# Patient Record
Sex: Female | Born: 1940 | Race: White | Hispanic: No | Marital: Married | Smoking: Never smoker
Health system: Southern US, Community
[De-identification: ages and names within clinical notes are randomized; demographics above are authoritative.]

## PROBLEM LIST (undated history)

## (undated) DIAGNOSIS — I6529 Occlusion and stenosis of unspecified carotid artery: Secondary | ICD-10-CM

## (undated) DIAGNOSIS — E119 Type 2 diabetes mellitus without complications: Secondary | ICD-10-CM

## (undated) DIAGNOSIS — R413 Other amnesia: Secondary | ICD-10-CM

## (undated) DIAGNOSIS — I499 Cardiac arrhythmia, unspecified: Secondary | ICD-10-CM

## (undated) DIAGNOSIS — F32A Depression, unspecified: Secondary | ICD-10-CM

## (undated) DIAGNOSIS — F329 Major depressive disorder, single episode, unspecified: Secondary | ICD-10-CM

## (undated) DIAGNOSIS — C801 Malignant (primary) neoplasm, unspecified: Secondary | ICD-10-CM

## (undated) DIAGNOSIS — I10 Essential (primary) hypertension: Secondary | ICD-10-CM

## (undated) DIAGNOSIS — I639 Cerebral infarction, unspecified: Secondary | ICD-10-CM

## (undated) HISTORY — PX: CATARACT EXTRACTION: SUR2

## (undated) HISTORY — DX: Malignant (primary) neoplasm, unspecified: C80.1

## (undated) HISTORY — DX: Cerebral infarction, unspecified: I63.9

## (undated) HISTORY — DX: Other amnesia: R41.3

## (undated) HISTORY — PX: CORONARY ANGIOPLASTY: SHX604

## (undated) HISTORY — DX: Occlusion and stenosis of unspecified carotid artery: I65.29

## (undated) HISTORY — DX: Cardiac arrhythmia, unspecified: I49.9

## (undated) HISTORY — DX: Major depressive disorder, single episode, unspecified: F32.9

## (undated) HISTORY — DX: Depression, unspecified: F32.A

---

## 2000-03-26 ENCOUNTER — Other Ambulatory Visit: Admission: RE | Admit: 2000-03-26 | Discharge: 2000-03-26 | Payer: Self-pay | Admitting: *Deleted

## 2000-03-27 ENCOUNTER — Encounter: Payer: Self-pay | Admitting: *Deleted

## 2000-03-27 ENCOUNTER — Encounter: Admission: RE | Admit: 2000-03-27 | Discharge: 2000-03-27 | Payer: Self-pay | Admitting: *Deleted

## 2001-03-15 ENCOUNTER — Emergency Department (HOSPITAL_COMMUNITY): Admission: EM | Admit: 2001-03-15 | Discharge: 2001-03-15 | Payer: Self-pay

## 2001-03-15 ENCOUNTER — Encounter: Payer: Self-pay | Admitting: Emergency Medicine

## 2003-01-01 ENCOUNTER — Encounter: Payer: Self-pay | Admitting: *Deleted

## 2003-01-01 ENCOUNTER — Encounter: Admission: RE | Admit: 2003-01-01 | Discharge: 2003-01-01 | Payer: Self-pay | Admitting: *Deleted

## 2003-02-18 ENCOUNTER — Encounter: Admission: RE | Admit: 2003-02-18 | Discharge: 2003-02-18 | Payer: Self-pay | Admitting: Family Medicine

## 2003-02-18 HISTORY — PX: BASAL CELL CARCINOMA EXCISION: SHX1214

## 2003-03-16 ENCOUNTER — Encounter: Admission: RE | Admit: 2003-03-16 | Discharge: 2003-03-16 | Payer: Self-pay | Admitting: Family Medicine

## 2003-04-21 ENCOUNTER — Encounter: Admission: RE | Admit: 2003-04-21 | Discharge: 2003-04-21 | Payer: Self-pay | Admitting: Family Medicine

## 2003-04-26 ENCOUNTER — Encounter: Admission: RE | Admit: 2003-04-26 | Discharge: 2003-04-26 | Payer: Self-pay | Admitting: Family Medicine

## 2003-05-27 ENCOUNTER — Encounter: Admission: RE | Admit: 2003-05-27 | Discharge: 2003-05-27 | Payer: Self-pay | Admitting: Family Medicine

## 2003-05-31 ENCOUNTER — Encounter: Admission: RE | Admit: 2003-05-31 | Discharge: 2003-05-31 | Payer: Self-pay | Admitting: Family Medicine

## 2003-06-11 ENCOUNTER — Encounter: Admission: RE | Admit: 2003-06-11 | Discharge: 2003-06-11 | Payer: Self-pay | Admitting: Family Medicine

## 2003-06-21 ENCOUNTER — Other Ambulatory Visit: Admission: RE | Admit: 2003-06-21 | Discharge: 2003-06-21 | Payer: Self-pay | Admitting: Family Medicine

## 2003-06-21 ENCOUNTER — Encounter: Admission: RE | Admit: 2003-06-21 | Discharge: 2003-06-21 | Payer: Self-pay | Admitting: Family Medicine

## 2003-06-29 ENCOUNTER — Encounter: Admission: RE | Admit: 2003-06-29 | Discharge: 2003-09-27 | Payer: Self-pay | Admitting: Family Medicine

## 2003-07-21 ENCOUNTER — Encounter: Admission: RE | Admit: 2003-07-21 | Discharge: 2003-07-21 | Payer: Self-pay | Admitting: Family Medicine

## 2003-08-03 ENCOUNTER — Encounter: Admission: RE | Admit: 2003-08-03 | Discharge: 2003-08-03 | Payer: Self-pay | Admitting: Family Medicine

## 2003-10-06 ENCOUNTER — Ambulatory Visit (HOSPITAL_COMMUNITY): Admission: RE | Admit: 2003-10-06 | Discharge: 2003-10-06 | Payer: Self-pay | Admitting: Vascular Surgery

## 2003-10-06 ENCOUNTER — Encounter: Admission: RE | Admit: 2003-10-06 | Discharge: 2003-10-06 | Payer: Self-pay | Admitting: Family Medicine

## 2003-10-08 ENCOUNTER — Encounter: Admission: RE | Admit: 2003-10-08 | Discharge: 2003-10-08 | Payer: Self-pay | Admitting: Family Medicine

## 2003-10-29 ENCOUNTER — Encounter: Admission: RE | Admit: 2003-10-29 | Discharge: 2003-10-29 | Payer: Self-pay | Admitting: Family Medicine

## 2003-11-17 ENCOUNTER — Ambulatory Visit (HOSPITAL_COMMUNITY): Admission: RE | Admit: 2003-11-17 | Discharge: 2003-11-17 | Payer: Self-pay | Admitting: Gastroenterology

## 2003-11-22 ENCOUNTER — Encounter: Admission: RE | Admit: 2003-11-22 | Discharge: 2003-11-22 | Payer: Self-pay | Admitting: Family Medicine

## 2004-02-01 ENCOUNTER — Encounter: Admission: RE | Admit: 2004-02-01 | Discharge: 2004-02-01 | Payer: Self-pay | Admitting: Sports Medicine

## 2004-02-24 ENCOUNTER — Ambulatory Visit: Payer: Self-pay | Admitting: Family Medicine

## 2004-02-28 ENCOUNTER — Ambulatory Visit: Payer: Self-pay | Admitting: Sports Medicine

## 2004-05-22 ENCOUNTER — Ambulatory Visit: Payer: Self-pay | Admitting: Sports Medicine

## 2004-06-04 DIAGNOSIS — I639 Cerebral infarction, unspecified: Secondary | ICD-10-CM

## 2004-06-04 HISTORY — DX: Cerebral infarction, unspecified: I63.9

## 2004-06-12 ENCOUNTER — Ambulatory Visit: Payer: Self-pay | Admitting: Family Medicine

## 2004-06-20 ENCOUNTER — Ambulatory Visit: Payer: Self-pay | Admitting: Family Medicine

## 2004-08-07 ENCOUNTER — Ambulatory Visit: Payer: Self-pay | Admitting: Sports Medicine

## 2004-08-17 ENCOUNTER — Ambulatory Visit: Payer: Self-pay | Admitting: Family Medicine

## 2004-08-22 ENCOUNTER — Ambulatory Visit: Payer: Self-pay | Admitting: Sports Medicine

## 2004-11-09 ENCOUNTER — Ambulatory Visit: Payer: Self-pay | Admitting: Sports Medicine

## 2004-12-08 ENCOUNTER — Ambulatory Visit: Payer: Self-pay | Admitting: Family Medicine

## 2005-01-22 ENCOUNTER — Ambulatory Visit: Payer: Self-pay | Admitting: Sports Medicine

## 2005-02-23 ENCOUNTER — Emergency Department (HOSPITAL_COMMUNITY): Admission: EM | Admit: 2005-02-23 | Discharge: 2005-02-24 | Payer: Self-pay | Admitting: Emergency Medicine

## 2005-03-15 ENCOUNTER — Ambulatory Visit (HOSPITAL_COMMUNITY): Admission: RE | Admit: 2005-03-15 | Discharge: 2005-03-15 | Payer: Self-pay | Admitting: Sports Medicine

## 2005-03-23 ENCOUNTER — Ambulatory Visit: Payer: Self-pay | Admitting: Sports Medicine

## 2005-03-26 ENCOUNTER — Ambulatory Visit: Payer: Self-pay | Admitting: Family Medicine

## 2005-03-26 ENCOUNTER — Ambulatory Visit (HOSPITAL_COMMUNITY): Admission: RE | Admit: 2005-03-26 | Discharge: 2005-03-26 | Payer: Self-pay | Admitting: Family Medicine

## 2005-04-13 ENCOUNTER — Ambulatory Visit: Payer: Self-pay | Admitting: Family Medicine

## 2005-05-04 HISTORY — PX: CAROTID ENDARTERECTOMY: SUR193

## 2005-05-08 ENCOUNTER — Encounter (INDEPENDENT_AMBULATORY_CARE_PROVIDER_SITE_OTHER): Payer: Self-pay | Admitting: *Deleted

## 2005-05-08 ENCOUNTER — Inpatient Hospital Stay (HOSPITAL_COMMUNITY): Admission: RE | Admit: 2005-05-08 | Discharge: 2005-05-09 | Payer: Self-pay | Admitting: Vascular Surgery

## 2005-05-15 ENCOUNTER — Ambulatory Visit (HOSPITAL_COMMUNITY): Admission: RE | Admit: 2005-05-15 | Discharge: 2005-05-15 | Payer: Self-pay | Admitting: Ophthalmology

## 2005-05-18 ENCOUNTER — Encounter: Admission: RE | Admit: 2005-05-18 | Discharge: 2005-05-18 | Payer: Self-pay | Admitting: Sports Medicine

## 2005-06-25 HISTORY — PX: GLAUCOMA SURGERY: SHX656

## 2005-08-01 ENCOUNTER — Ambulatory Visit: Payer: Self-pay | Admitting: Family Medicine

## 2005-08-17 ENCOUNTER — Ambulatory Visit: Payer: Self-pay | Admitting: Sports Medicine

## 2005-09-07 ENCOUNTER — Ambulatory Visit: Payer: Self-pay | Admitting: Family Medicine

## 2005-09-20 ENCOUNTER — Ambulatory Visit: Payer: Self-pay | Admitting: Family Medicine

## 2005-10-02 ENCOUNTER — Encounter (INDEPENDENT_AMBULATORY_CARE_PROVIDER_SITE_OTHER): Payer: Self-pay | Admitting: *Deleted

## 2005-10-15 ENCOUNTER — Ambulatory Visit: Payer: Self-pay | Admitting: Family Medicine

## 2005-10-26 ENCOUNTER — Ambulatory Visit: Payer: Self-pay | Admitting: Family Medicine

## 2005-11-30 ENCOUNTER — Ambulatory Visit: Payer: Self-pay | Admitting: Family Medicine

## 2006-01-16 ENCOUNTER — Ambulatory Visit: Payer: Self-pay | Admitting: Family Medicine

## 2006-02-01 ENCOUNTER — Ambulatory Visit: Payer: Self-pay | Admitting: Family Medicine

## 2006-04-15 ENCOUNTER — Ambulatory Visit: Payer: Self-pay | Admitting: Sports Medicine

## 2006-06-05 ENCOUNTER — Ambulatory Visit: Payer: Self-pay | Admitting: Family Medicine

## 2006-06-05 ENCOUNTER — Encounter: Admission: RE | Admit: 2006-06-05 | Discharge: 2006-06-05 | Payer: Self-pay | Admitting: Family Medicine

## 2006-07-09 ENCOUNTER — Ambulatory Visit: Payer: Self-pay | Admitting: Family Medicine

## 2006-07-10 ENCOUNTER — Ambulatory Visit: Payer: Self-pay | Admitting: Family Medicine

## 2006-08-02 ENCOUNTER — Encounter (INDEPENDENT_AMBULATORY_CARE_PROVIDER_SITE_OTHER): Payer: Self-pay | Admitting: *Deleted

## 2006-08-26 ENCOUNTER — Encounter (INDEPENDENT_AMBULATORY_CARE_PROVIDER_SITE_OTHER): Payer: Self-pay | Admitting: Family Medicine

## 2006-08-27 ENCOUNTER — Telehealth (INDEPENDENT_AMBULATORY_CARE_PROVIDER_SITE_OTHER): Payer: Self-pay | Admitting: *Deleted

## 2006-08-27 ENCOUNTER — Ambulatory Visit: Payer: Self-pay | Admitting: Family Medicine

## 2006-09-19 ENCOUNTER — Ambulatory Visit: Payer: Self-pay | Admitting: Family Medicine

## 2006-09-19 DIAGNOSIS — K589 Irritable bowel syndrome without diarrhea: Secondary | ICD-10-CM | POA: Insufficient documentation

## 2006-09-26 ENCOUNTER — Encounter (INDEPENDENT_AMBULATORY_CARE_PROVIDER_SITE_OTHER): Payer: Self-pay | Admitting: Family Medicine

## 2006-10-01 ENCOUNTER — Encounter (INDEPENDENT_AMBULATORY_CARE_PROVIDER_SITE_OTHER): Payer: Self-pay | Admitting: Family Medicine

## 2006-10-01 ENCOUNTER — Ambulatory Visit: Payer: Self-pay | Admitting: Family Medicine

## 2006-10-01 DIAGNOSIS — E119 Type 2 diabetes mellitus without complications: Secondary | ICD-10-CM | POA: Insufficient documentation

## 2006-10-01 DIAGNOSIS — E785 Hyperlipidemia, unspecified: Secondary | ICD-10-CM | POA: Insufficient documentation

## 2006-10-01 DIAGNOSIS — I10 Essential (primary) hypertension: Secondary | ICD-10-CM | POA: Insufficient documentation

## 2006-10-01 LAB — CONVERTED CEMR LAB
AST: 32 units/L (ref 0–37)
Alkaline Phosphatase: 87 units/L (ref 39–117)
BUN: 22 mg/dL (ref 6–23)
Calcium: 9.6 mg/dL (ref 8.4–10.5)
Chloride: 104 meq/L (ref 96–112)
Glucose, Bld: 131 mg/dL — ABNORMAL HIGH (ref 70–99)
Glucose, Urine, Semiquant: NEGATIVE
Hemoglobin: 13.2 g/dL
Nitrite: NEGATIVE
Potassium: 4.3 meq/L (ref 3.5–5.3)
Sodium: 140 meq/L (ref 135–145)
Specific Gravity, Urine: 1.02
Total Bilirubin: 1.1 mg/dL (ref 0.3–1.2)
pH: 6

## 2006-10-03 ENCOUNTER — Encounter: Admission: RE | Admit: 2006-10-03 | Discharge: 2006-10-03 | Payer: Self-pay | Admitting: Gastroenterology

## 2006-10-03 ENCOUNTER — Ambulatory Visit: Payer: Self-pay | Admitting: Sports Medicine

## 2006-10-09 ENCOUNTER — Encounter (INDEPENDENT_AMBULATORY_CARE_PROVIDER_SITE_OTHER): Payer: Self-pay | Admitting: Family Medicine

## 2006-10-09 ENCOUNTER — Ambulatory Visit: Payer: Self-pay

## 2006-10-09 ENCOUNTER — Telehealth (INDEPENDENT_AMBULATORY_CARE_PROVIDER_SITE_OTHER): Payer: Self-pay | Admitting: *Deleted

## 2006-10-10 ENCOUNTER — Telehealth: Payer: Self-pay | Admitting: *Deleted

## 2006-10-16 ENCOUNTER — Telehealth (INDEPENDENT_AMBULATORY_CARE_PROVIDER_SITE_OTHER): Payer: Self-pay | Admitting: *Deleted

## 2006-10-24 ENCOUNTER — Encounter (INDEPENDENT_AMBULATORY_CARE_PROVIDER_SITE_OTHER): Payer: Self-pay | Admitting: Family Medicine

## 2006-11-07 ENCOUNTER — Encounter (INDEPENDENT_AMBULATORY_CARE_PROVIDER_SITE_OTHER): Payer: Self-pay | Admitting: Family Medicine

## 2006-11-07 ENCOUNTER — Ambulatory Visit: Payer: Self-pay | Admitting: Family Medicine

## 2006-11-11 ENCOUNTER — Telehealth: Payer: Self-pay | Admitting: Psychology

## 2006-11-15 ENCOUNTER — Telehealth: Payer: Self-pay | Admitting: *Deleted

## 2006-11-18 ENCOUNTER — Ambulatory Visit: Payer: Self-pay | Admitting: Family Medicine

## 2006-11-18 ENCOUNTER — Encounter: Payer: Self-pay | Admitting: Family Medicine

## 2006-11-18 LAB — CONVERTED CEMR LAB
Calcium: 9.8 mg/dL (ref 8.4–10.5)
Chloride: 105 meq/L (ref 96–112)
Creatinine, Ser: 1.17 mg/dL (ref 0.40–1.20)
Glucose, Bld: 109 mg/dL — ABNORMAL HIGH (ref 70–99)
Hgb A1c MFr Bld: 6.3 %
Potassium: 4.6 meq/L (ref 3.5–5.3)
Sodium: 140 meq/L (ref 135–145)
TSH: 1.373 microintl units/mL (ref 0.350–5.50)
Total Protein: 7.5 g/dL (ref 6.0–8.3)

## 2006-11-19 ENCOUNTER — Encounter (INDEPENDENT_AMBULATORY_CARE_PROVIDER_SITE_OTHER): Payer: Self-pay | Admitting: Family Medicine

## 2006-11-19 ENCOUNTER — Encounter: Admission: RE | Admit: 2006-11-19 | Discharge: 2006-11-19 | Payer: Self-pay | Admitting: Sports Medicine

## 2006-11-25 ENCOUNTER — Telehealth (INDEPENDENT_AMBULATORY_CARE_PROVIDER_SITE_OTHER): Payer: Self-pay | Admitting: Family Medicine

## 2006-11-26 ENCOUNTER — Encounter: Payer: Self-pay | Admitting: Family Medicine

## 2006-11-29 ENCOUNTER — Telehealth (INDEPENDENT_AMBULATORY_CARE_PROVIDER_SITE_OTHER): Payer: Self-pay | Admitting: Family Medicine

## 2006-12-10 ENCOUNTER — Encounter (INDEPENDENT_AMBULATORY_CARE_PROVIDER_SITE_OTHER): Payer: Self-pay | Admitting: Family Medicine

## 2006-12-10 ENCOUNTER — Ambulatory Visit: Payer: Self-pay | Admitting: Family Medicine

## 2006-12-10 LAB — CONVERTED CEMR LAB
Alkaline Phosphatase: 74 units/L (ref 39–117)
Anti Nuclear Antibody(ANA): NEGATIVE
CO2: 22 meq/L (ref 19–32)
Chloride: 103 meq/L (ref 96–112)
Glucose, Bld: 124 mg/dL — ABNORMAL HIGH (ref 70–99)
MCHC: 33.5 g/dL (ref 30.0–36.0)
Platelets: 244 10*3/uL (ref 150–400)
WBC: 10.2 10*3/uL (ref 4.0–10.5)

## 2006-12-11 ENCOUNTER — Encounter (INDEPENDENT_AMBULATORY_CARE_PROVIDER_SITE_OTHER): Payer: Self-pay | Admitting: Family Medicine

## 2006-12-18 ENCOUNTER — Ambulatory Visit: Payer: Self-pay | Admitting: Family Medicine

## 2006-12-26 ENCOUNTER — Telehealth: Payer: Self-pay | Admitting: Psychology

## 2007-01-22 ENCOUNTER — Ambulatory Visit: Payer: Self-pay | Admitting: Endocrinology

## 2007-01-24 ENCOUNTER — Telehealth: Payer: Self-pay | Admitting: Psychology

## 2007-01-29 ENCOUNTER — Ambulatory Visit: Payer: Self-pay | Admitting: Family Medicine

## 2007-01-29 ENCOUNTER — Encounter (INDEPENDENT_AMBULATORY_CARE_PROVIDER_SITE_OTHER): Payer: Self-pay | Admitting: Family Medicine

## 2007-02-06 ENCOUNTER — Encounter (INDEPENDENT_AMBULATORY_CARE_PROVIDER_SITE_OTHER): Payer: Self-pay | Admitting: Family Medicine

## 2007-02-06 ENCOUNTER — Telehealth: Payer: Self-pay | Admitting: *Deleted

## 2007-02-07 ENCOUNTER — Telehealth (INDEPENDENT_AMBULATORY_CARE_PROVIDER_SITE_OTHER): Payer: Self-pay | Admitting: Family Medicine

## 2007-02-14 ENCOUNTER — Encounter (INDEPENDENT_AMBULATORY_CARE_PROVIDER_SITE_OTHER): Payer: Self-pay | Admitting: Family Medicine

## 2007-02-14 ENCOUNTER — Ambulatory Visit: Payer: Self-pay | Admitting: Family Medicine

## 2007-02-14 LAB — CONVERTED CEMR LAB: Hgb A1c MFr Bld: 6.7 %

## 2007-02-17 ENCOUNTER — Encounter (INDEPENDENT_AMBULATORY_CARE_PROVIDER_SITE_OTHER): Payer: Self-pay | Admitting: Family Medicine

## 2007-02-24 ENCOUNTER — Telehealth (INDEPENDENT_AMBULATORY_CARE_PROVIDER_SITE_OTHER): Payer: Self-pay | Admitting: *Deleted

## 2007-02-24 ENCOUNTER — Ambulatory Visit: Payer: Self-pay | Admitting: Sports Medicine

## 2007-02-24 DIAGNOSIS — A6000 Herpesviral infection of urogenital system, unspecified: Secondary | ICD-10-CM | POA: Insufficient documentation

## 2007-03-07 ENCOUNTER — Encounter (INDEPENDENT_AMBULATORY_CARE_PROVIDER_SITE_OTHER): Payer: Self-pay | Admitting: Family Medicine

## 2007-03-10 ENCOUNTER — Telehealth (INDEPENDENT_AMBULATORY_CARE_PROVIDER_SITE_OTHER): Payer: Self-pay | Admitting: Family Medicine

## 2007-03-10 ENCOUNTER — Telehealth: Payer: Self-pay | Admitting: Psychology

## 2007-03-25 ENCOUNTER — Encounter: Payer: Self-pay | Admitting: Psychology

## 2007-04-02 ENCOUNTER — Telehealth (INDEPENDENT_AMBULATORY_CARE_PROVIDER_SITE_OTHER): Payer: Self-pay | Admitting: Family Medicine

## 2007-04-03 ENCOUNTER — Telehealth (INDEPENDENT_AMBULATORY_CARE_PROVIDER_SITE_OTHER): Payer: Self-pay | Admitting: Family Medicine

## 2007-04-16 ENCOUNTER — Telehealth: Payer: Self-pay | Admitting: *Deleted

## 2007-04-18 ENCOUNTER — Encounter (INDEPENDENT_AMBULATORY_CARE_PROVIDER_SITE_OTHER): Payer: Self-pay | Admitting: Family Medicine

## 2007-04-23 ENCOUNTER — Ambulatory Visit: Payer: Self-pay | Admitting: Family Medicine

## 2007-05-05 ENCOUNTER — Telehealth (INDEPENDENT_AMBULATORY_CARE_PROVIDER_SITE_OTHER): Payer: Self-pay | Admitting: Family Medicine

## 2007-05-07 ENCOUNTER — Ambulatory Visit: Payer: Self-pay | Admitting: Psychology

## 2007-05-07 ENCOUNTER — Encounter (INDEPENDENT_AMBULATORY_CARE_PROVIDER_SITE_OTHER): Payer: Self-pay | Admitting: Family Medicine

## 2007-05-21 ENCOUNTER — Ambulatory Visit: Payer: Self-pay | Admitting: Psychology

## 2007-06-26 ENCOUNTER — Encounter: Admission: RE | Admit: 2007-06-26 | Discharge: 2007-06-26 | Payer: Self-pay | Admitting: Hospitalist

## 2007-07-03 ENCOUNTER — Encounter (INDEPENDENT_AMBULATORY_CARE_PROVIDER_SITE_OTHER): Payer: Self-pay | Admitting: Family Medicine

## 2007-07-09 ENCOUNTER — Ambulatory Visit: Payer: Self-pay | Admitting: Psychology

## 2007-07-09 ENCOUNTER — Ambulatory Visit: Payer: Self-pay | Admitting: Family Medicine

## 2007-07-30 ENCOUNTER — Telehealth: Payer: Self-pay | Admitting: Psychology

## 2007-08-01 ENCOUNTER — Encounter: Payer: Self-pay | Admitting: Family Medicine

## 2007-08-01 ENCOUNTER — Telehealth (INDEPENDENT_AMBULATORY_CARE_PROVIDER_SITE_OTHER): Payer: Self-pay | Admitting: Family Medicine

## 2007-11-25 ENCOUNTER — Encounter: Payer: Self-pay | Admitting: *Deleted

## 2007-12-10 ENCOUNTER — Telehealth: Payer: Self-pay | Admitting: Family Medicine

## 2008-01-14 ENCOUNTER — Encounter: Payer: Self-pay | Admitting: Family Medicine

## 2008-02-17 ENCOUNTER — Encounter: Payer: Self-pay | Admitting: *Deleted

## 2009-06-13 ENCOUNTER — Telehealth: Payer: Self-pay | Admitting: Psychology

## 2009-06-17 ENCOUNTER — Ambulatory Visit: Payer: Self-pay | Admitting: Family Medicine

## 2009-06-17 ENCOUNTER — Encounter: Payer: Self-pay | Admitting: Family Medicine

## 2009-06-17 LAB — CONVERTED CEMR LAB
AST: 23 units/L (ref 0–37)
Albumin: 4.6 g/dL (ref 3.5–5.2)
BUN: 14 mg/dL (ref 6–23)
CO2: 21 meq/L (ref 19–32)
Chloride: 102 meq/L (ref 96–112)
Creatinine, Ser: 0.86 mg/dL (ref 0.40–1.20)
Direct LDL: 72 mg/dL
Glucose, Bld: 74 mg/dL (ref 70–99)
Hemoglobin: 14.2 g/dL (ref 12.0–15.0)
Platelets: 235 10*3/uL (ref 150–400)
Potassium: 3.9 meq/L (ref 3.5–5.3)
RBC: 4.88 M/uL (ref 3.87–5.11)
Sodium: 140 meq/L (ref 135–145)
TSH: 1.531 microintl units/mL (ref 0.350–4.500)
WBC: 9.2 10*3/uL (ref 4.0–10.5)

## 2009-06-22 ENCOUNTER — Encounter: Payer: Self-pay | Admitting: Family Medicine

## 2009-06-22 ENCOUNTER — Ambulatory Visit: Payer: Self-pay | Admitting: Psychology

## 2009-06-22 DIAGNOSIS — F319 Bipolar disorder, unspecified: Secondary | ICD-10-CM | POA: Insufficient documentation

## 2009-06-30 ENCOUNTER — Encounter: Payer: Self-pay | Admitting: Family Medicine

## 2009-07-01 ENCOUNTER — Ambulatory Visit: Payer: Self-pay | Admitting: Family Medicine

## 2009-07-04 ENCOUNTER — Ambulatory Visit: Payer: Self-pay | Admitting: Psychology

## 2009-07-19 ENCOUNTER — Ambulatory Visit: Payer: Self-pay | Admitting: Family Medicine

## 2009-07-20 ENCOUNTER — Ambulatory Visit: Payer: Self-pay | Admitting: Family Medicine

## 2009-08-08 ENCOUNTER — Ambulatory Visit: Payer: Self-pay | Admitting: Family Medicine

## 2009-08-24 ENCOUNTER — Ambulatory Visit: Payer: Self-pay | Admitting: Family Medicine

## 2009-11-14 ENCOUNTER — Encounter: Payer: Self-pay | Admitting: Family Medicine

## 2009-11-15 ENCOUNTER — Encounter: Payer: Self-pay | Admitting: Family Medicine

## 2009-11-15 ENCOUNTER — Ambulatory Visit: Payer: Self-pay | Admitting: Family Medicine

## 2009-11-15 LAB — CONVERTED CEMR LAB
Albumin: 4.5 g/dL (ref 3.5–5.2)
BUN: 15 mg/dL (ref 6–23)
Calcium: 9.7 mg/dL (ref 8.4–10.5)
Creatinine, Ser: 0.99 mg/dL (ref 0.40–1.20)
Potassium: 4.5 meq/L (ref 3.5–5.3)

## 2009-11-18 ENCOUNTER — Telehealth: Payer: Self-pay | Admitting: *Deleted

## 2009-11-23 ENCOUNTER — Telehealth: Payer: Self-pay | Admitting: Family Medicine

## 2009-11-23 ENCOUNTER — Ambulatory Visit: Payer: Self-pay | Admitting: Sports Medicine

## 2009-11-23 ENCOUNTER — Encounter: Admission: RE | Admit: 2009-11-23 | Discharge: 2009-11-23 | Payer: Self-pay | Admitting: Family Medicine

## 2009-11-23 ENCOUNTER — Encounter: Payer: Self-pay | Admitting: Family Medicine

## 2009-11-23 ENCOUNTER — Ambulatory Visit: Payer: Self-pay | Admitting: Family Medicine

## 2009-11-23 DIAGNOSIS — M25519 Pain in unspecified shoulder: Secondary | ICD-10-CM | POA: Insufficient documentation

## 2009-11-28 ENCOUNTER — Encounter: Payer: Self-pay | Admitting: Family Medicine

## 2009-11-28 ENCOUNTER — Telehealth: Payer: Self-pay | Admitting: Family Medicine

## 2009-11-29 ENCOUNTER — Telehealth: Payer: Self-pay | Admitting: Psychology

## 2009-11-30 ENCOUNTER — Telehealth: Payer: Self-pay | Admitting: Family Medicine

## 2009-12-16 ENCOUNTER — Encounter: Payer: Self-pay | Admitting: Family Medicine

## 2009-12-27 ENCOUNTER — Encounter: Admission: RE | Admit: 2009-12-27 | Discharge: 2009-12-27 | Payer: Self-pay | Admitting: Otolaryngology

## 2010-01-02 ENCOUNTER — Encounter: Payer: Self-pay | Admitting: Family Medicine

## 2010-01-26 ENCOUNTER — Encounter: Payer: Self-pay | Admitting: Family Medicine

## 2010-01-30 ENCOUNTER — Encounter: Payer: Self-pay | Admitting: Family Medicine

## 2010-02-21 ENCOUNTER — Encounter: Payer: Self-pay | Admitting: *Deleted

## 2010-02-21 ENCOUNTER — Telehealth: Payer: Self-pay | Admitting: *Deleted

## 2010-03-06 ENCOUNTER — Telehealth: Payer: Self-pay | Admitting: Psychology

## 2010-03-09 ENCOUNTER — Encounter: Payer: Self-pay | Admitting: Family Medicine

## 2010-03-10 ENCOUNTER — Telehealth: Payer: Self-pay | Admitting: Family Medicine

## 2010-03-16 ENCOUNTER — Ambulatory Visit: Payer: Self-pay | Admitting: Family Medicine

## 2010-03-16 ENCOUNTER — Encounter: Payer: Self-pay | Admitting: Family Medicine

## 2010-03-16 DIAGNOSIS — F909 Attention-deficit hyperactivity disorder, unspecified type: Secondary | ICD-10-CM | POA: Insufficient documentation

## 2010-03-16 DIAGNOSIS — L989 Disorder of the skin and subcutaneous tissue, unspecified: Secondary | ICD-10-CM | POA: Insufficient documentation

## 2010-03-16 LAB — CONVERTED CEMR LAB
Albumin: 4.4 g/dL (ref 3.5–5.2)
Calcium: 9.6 mg/dL (ref 8.4–10.5)
Creatinine, Ser: 1.02 mg/dL (ref 0.40–1.20)
Glucose, Bld: 81 mg/dL (ref 70–99)
Total Protein: 7.1 g/dL (ref 6.0–8.3)

## 2010-03-22 ENCOUNTER — Telehealth (INDEPENDENT_AMBULATORY_CARE_PROVIDER_SITE_OTHER): Payer: Self-pay | Admitting: *Deleted

## 2010-04-05 ENCOUNTER — Ambulatory Visit: Payer: Self-pay | Admitting: Family Medicine

## 2010-04-10 ENCOUNTER — Encounter: Payer: Self-pay | Admitting: Family Medicine

## 2010-05-31 ENCOUNTER — Ambulatory Visit (HOSPITAL_COMMUNITY): Payer: Self-pay | Admitting: Psychiatry

## 2010-06-23 ENCOUNTER — Encounter: Payer: Self-pay | Admitting: *Deleted

## 2010-06-23 ENCOUNTER — Ambulatory Visit: Admission: RE | Admit: 2010-06-23 | Discharge: 2010-06-23 | Payer: Self-pay | Source: Home / Self Care

## 2010-06-23 DIAGNOSIS — L570 Actinic keratosis: Secondary | ICD-10-CM | POA: Insufficient documentation

## 2010-06-28 ENCOUNTER — Ambulatory Visit (HOSPITAL_COMMUNITY)
Admission: RE | Admit: 2010-06-28 | Discharge: 2010-06-28 | Payer: Self-pay | Source: Home / Self Care | Attending: Psychiatry | Admitting: Psychiatry

## 2010-06-29 ENCOUNTER — Encounter (INDEPENDENT_AMBULATORY_CARE_PROVIDER_SITE_OTHER): Payer: Self-pay | Admitting: *Deleted

## 2010-07-04 NOTE — Letter (Signed)
Summary: Generic Letter  [facility]  [address]   [place], [place] [address]   Phone: [phone]  Fax: [phone]    06/22/2009  Brittany Chung [address] [place], [place]  [address]  Dear Ms. Dorgan,  It was nice to meet you the other day. I tried calling but didn't get an answer. I wanted to let you know that your thyroid test came back normal. Also your LDL cholesterol (the "bad" cholesterol) is 72. Ideally for a diabetic, we would like this below 70. I don't think we need to make any medicine changes at this point. Watching your intake of fried, greasy, fatty, high-cholesterol foods should be enough to get this into the normal range. Hope to see you again soon. Please call with any questions.         Sincerely,   Myrtie Soman  MD  Appended Document: Generic Letter mailed

## 2010-07-04 NOTE — Progress Notes (Signed)
  Phone Note Outgoing Call   Call placed by: Valarie Merino MD,  November 28, 2009 10:18 AM Call placed to: Patient Summary of Call: Left a voice message for patient to return my call.

## 2010-07-04 NOTE — Assessment & Plan Note (Signed)
Summary: BP CK/KH  BP checked manually using regular cuff after patient rested 10 minutes. BP left arm 124/60 pulse 80. taking medication as directed. has follow up with Dr. Rexene Alberts on 07/15/2009. Theresia Lo RN  July 01, 2009 9:25 AM  Nurse Visit   Allergies: 1)  ! Codeine  Orders Added: 1)  No Charge Patient Arrived (NCPA0) [[id number]]

## 2010-07-04 NOTE — Consult Note (Signed)
Summary: [facility]  [facility]   Imported By: Clydell Hakim 12/29/2009 16:20:29  _____________________________________________________________________  External Attachment:    Type:   Image     Comment:   External Document

## 2010-07-04 NOTE — Letter (Signed)
Summary: *Referral Letter  [facility]  [address]   [place], [place] [address]   Phone: [phone]  Fax: [phone]    11/15/2009  Thank you in advance for agreeing to see my patient:  Brittany Chung [address] 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 5)  ENALAPRIL MALEATE 5 MG TABS (ENALAPRIL MALEATE) 1/2 tab daily (BP low) 6)  ROPINIROLE HCL 1 MG TABS (ROPINIROLE HCL) one by mouth at bedtime 7)  STRATTERA 80 MG CAPS (ATOMOXETINE HCL) one by mouth daily 8)  BAYER CHILDRENS ASPIRIN 81 MG CHEW (ASPIRIN) one by mouth daily 9)  SEROQUEL XR 50 MG XR24H-TAB (QUETIAPINE FUMARATE) Please take two pills at bedtime.  Per Dr. Kathrynn Running in [facility].   Past Medical History: 1)  Carotid stenosis s/p CEA on R 2006 2)  diabetes - diagnosed about 5 years 3)  HTN 4)  HLD 5)   IBS 6)  CVA -  ~2006 7)  Memory loss 8)  RLS 9)  Catact surgery about 2007 10)  ADD 11)  Bipolar disorder 12)  Depression 13)  Genital herpes 14)  Glaucoma 15)  Cataracts     Pertinent Labs: A1C 6.2%   Thank you again for agreeing to see our patient; please contact us if you have any further questions or need additional information.  Sincerely,  Luretha Murphy NP/Terry Everhart MD

## 2010-07-04 NOTE — Progress Notes (Signed)
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  She has returned to [facility] and is looking to reestablish care. Initial call taken by: Spero Geralds PsyD,  June 13, 2009 12:39 PM  Follow-up for Phone Call        pt is calling to talk to Dr Pascal Lux Follow-up by: De Nurse,  June 14, 2009 3:59 PM  Additional Follow-up for Phone Call Additional follow up Details #1::        Medicine for mood issues were interfering with Requip.  Taking Strattera now.  Still having trouble with depression.  Wants to discuss meds and mood.  Scheduled for 1.19.2011 at 10:30 in [facility]. Additional Follow-up by: Spero Geralds PsyD,  June 15, 2009 9:07 AM

## 2010-07-04 NOTE — Assessment & Plan Note (Signed)
Summary: ear prob,df   Vital Signs:  Patient profile:   70 year old female Weight:      158.3 pounds Temp:     98 degrees F oral Pulse rate:   76 / minute BP sitting:   90 / 64  (left arm) Cuff size:   regular  Vitals Entered By: Loralee Pacas CMA (November 15, 2009 10:26 AM) Comments no family hx of hearing loss, pt states that she has had hearing deficiences for past 10 years  Hearing Screen  20db HL: Left  500 hz: No Response 1000 hz: No Response 2000 hz: No Response 4000 hz: No Response Right  500 hz: No Response 1000 hz: No Response 2000 hz: No Response 4000 hz: No Response   Hearing Testing Entered By: Loralee Pacas CMA (November 15, 2009 10:28 AM) 25db HL: Left  500 hz: No Response 1000 hz: No Response 2000 hz: No Response 4000 hz: No Response Right  500 hz: No Response 1000 hz: No Response 2000 hz: No Response 4000 hz: No Response    Primary Care Provider:  Myrtie Soman  MD   History of Present Illness: Ringing in her left ear and worsening of long standing hearing loss.  Feels like her head is blocked up.  Denies recent URI or allergies.  Family is getting irritated at her because she keeps saying "what"  BP low today and that worries her.  Current Medications (verified): 1)  Simvastatin 40 Mg Tabs (Simvastatin) .... One By Mouth Daily For Cholesterol 2)  Glucophage 500 Mg  Tabs (Metformin Hcl) .Marland Kitchen.. 1 Tab By Mouth Daily For Diabetes 3)  Glipizide 5 Mg Tabs (Glipizide) .... Take 2 Tablets Every Day 4)  Valtrex 1 Gm Tabs (Valacyclovir Hcl) .... One Tab By Mouth Daily. 5)  Enalapril Maleate 5 Mg Tabs (Enalapril Maleate) .... 1/2 Tab Daily (Bp Low) 6)  Ropinirole Hcl 1 Mg Tabs (Ropinirole Hcl) .... One By Mouth At Bedtime 7)  Strattera 80 Mg Caps (Atomoxetine Hcl) .... One By Mouth Daily 8)  Bayer Childrens Aspirin 81 Mg Chew (Aspirin) .... One By Mouth Daily 9)  Seroquel Xr 50 Mg Xr24h-Tab (Quetiapine Fumarate) .... Please Take Two Pills At Bedtime.  Per Dr.  Kathrynn Running in [facility].  Allergies: 1)  ! Codeine  Review of Systems General:  Denies chills and fever. ENT:  Complains of decreased hearing and ringing in ears; denies ear discharge, earache, hoarseness, nasal congestion, postnasal drainage, sinus pressure, and sore throat. Resp:  Denies cough.  Physical Exam  General:  Moderately anxious, alert Ears:  External ear exam shows no significant lesions or deformities.  Otoscopic examination reveals clear canals, tympanic membranes are intact bilaterally with retraction, no inflammation or discharge. Was unable to hear WA sweep audiometer in either ear, but heard my conversation at a normal tone well.  Gross testing was also normal. Nose:  External nasal examination shows no deformity or inflammation. Nasal mucosa are pink and moist without lesions or exudates. Mouth:  Oral mucosa and oropharynx without lesions or exudates.  Teeth in good repair. No drainage Lungs:  Normal respiratory effort, chest expands symmetrically. Lungs are clear to auscultation, no crackles or wheezes. Heart:  Normal rate and regular rhythm. S1 and S2 normal without gallop, murmur, click, rub or other extra sounds.   Impression & Recommendations:  Problem # 1:  HEARING LOSS (ICD-389.9) Apparently has been tested in the past, and has had hearing aids recommended.  She was very anxious about this  today, could not hear on audiometery testing but heard grossly.  Will refer to ENT as she now has associated tinnitus. Orders: ENT Referral (ENT) [facility]- Est Level  3 ([id number])  Problem # 2:  HYPERTENSION, BENIGN ESSENTIAL (ICD-401.1)  BP low today, at 90/64, asked to cut enalapril in 1/2, folllow up with new MD in 2 weeks. Her updated medication list for this problem includes:    Enalapril Maleate 5 Mg Tabs (Enalapril maleate) .Marland Kitchen... 1/2 tab daily (bp low)  Orders: [facility]- Est Level  3 ([id number])  Problem # 3:  BIPOLAR DISORDER UNSPECIFIED (ICD-296.80) Being follow by  behavioral health, moderately anxious today  Complete Medication List: 1)  Simvastatin 40 Mg Tabs (Simvastatin) .... One by mouth daily for cholesterol 2)  Glucophage 500 Mg Tabs (Metformin hcl) .Marland Kitchen.. 1 tab by mouth daily for diabetes 3)  Glipizide 5 Mg Tabs (Glipizide) .... Take 2 tablets every day 4)  Valtrex 1 Gm Tabs (Valacyclovir hcl) .... One tab by mouth daily. 5)  Enalapril Maleate 5 Mg Tabs (Enalapril maleate) .... 1/2 tab daily (bp low) 6)  Ropinirole Hcl 1 Mg Tabs (Ropinirole hcl) .... One by mouth at bedtime 7)  Strattera 80 Mg Caps (Atomoxetine hcl) .... One by mouth daily 8)  Bayer Childrens Aspirin 81 Mg Chew (Aspirin) .... One by mouth daily 9)  Seroquel Xr 50 Mg Xr24h-tab (Quetiapine fumarate) .... Please take two pills at bedtime.  per dr. Kathrynn Running in [facility].  Other Orders: A1C-[facility] ([id number]) Comp Met-[facility] [phone])  Patient Instructions: 1)  Recommend increase water intake 2)  Pesudoephhedrine 30 mg q 4 hours as needed for congestion must buy from the pharmacist 3)  You will be call about ENT referral 4)  Make apt in July to meet your new doctor.  Laboratory Results   Blood Tests   Date/Time Received: November 15, 2009 11:14 AM  Date/Time Reported: November 15, 2009 11:36 AM   HGBA1C: 6.2%   (Normal Range: Non-Diabetic - 3-6%   Control Diabetic - 6-8%)  Comments: ...........test performed by...........Marland KitchenTerese Door, CMA

## 2010-07-04 NOTE — Letter (Signed)
Summary: Generic Letter  [facility]  [address]   [place], [place] [address]   Phone: [phone]  Fax: [phone]    02/21/2010  KSENIA KUNZ [address] [place], [place]  [address]  Dear Ms. Raper,   We have been unable to reach you by phone. Could you please call & schedule an appointment to see your doctor? Our number is [phone]. I look forward to hearing from you.       Sincerely,   Golden Circle RN

## 2010-07-04 NOTE — Assessment & Plan Note (Signed)
Summary: Behavioral Medicine Follow-up   Primary Care Provider:  Myrtie Soman  MD   History of Present Illness: Brittany Chung presents today stating it is a "good day" to have a meeting with me.  She is not feeling especially motivated today.  I attempted to gather a history of the problem.  She would like to spend less time in her room, by herself.  She lives with her daughter, her daughter's husband and their five children ranging in age from 23 to 110.  She got a reversible mortgage and took the money to remodel her home.  She loves it and the outside area.  She has her own room and bathroom and is only in charge of keeping this clean.  The rest of the house is the family's responsibility and she says they do a good job.  She is able to eat meals with them but stopped cooking secondary to "not doing it right" by her daughter's standards.  She still cooks on Tuesdays to help out and this is appreciated by her daughter.  She had expressed concerns about not keeping a tidier place but says that I would think her room was beautiful if I walked into it today.  She likes to walk in [employer] park and is thinking of joining the [employer].  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  She is worried about overextending herself but overall wants to reconnect.  Allergies: 1)  ! Codeine   Impression & Recommendations:  Problem # 1:  BIPOLAR DISORDER UNSPECIFIED (ICD-296.80) She reports the Seroquel is working well and she is getting better sleep.  Affect is bright today.  Mood is reported as slightly depressed.  Her thoughts are clear and goal directed.  Her thought content is strikingly positive however she has a very negative self-evaluation when she doesn't meet her expectations.  One goal today was to determine whether her expectations were necessary or if perhaps, she could work on accepting her life the way it is (because it is  pretty good when she stops to think about it).  She could then stop beating herself up for things that she doesn't really plan to do but thinks she "should" be doing.    When I clarified things she wants to do vs. need, she mentioned getting out of her room and being more active.  Doing that sets her up for the day and helps her feel better in general.  See patient instructions for further plan.  Orders: Therapy 40-50- min- [facility] ([id number])  Complete Medication List: 1)  Trazodone Hcl 50 Mg Tabs (Trazodone hcl) .Marland Kitchen.. 1 tab by mouth at bedtime as needed for sleep.  wear your cpap to help your sleep as well 2)  Simvastatin 40 Mg Tabs (Simvastatin) .... One by mouth daily for cholesterol 3)  Glucophage 500 Mg Tabs (Metformin hcl) .Marland Kitchen.. 1 tab by mouth daily for diabetes 4)  Glipizide 5 Mg Tabs (Glipizide) .... Take 2 tablets every day 5)  Requip 0.5 Mg Tabs (Ropinirole hcl) .Marland Kitchen.. 1 tab by mouth at bedtime 6)  Valtrex 1 Gm Tabs (Valacyclovir hcl) .... One tab by mouth daily. 7)  Enalapril Maleate 5 Mg Tabs (Enalapril maleate) .... One by mouth daily for blood pressure. 8)  Ropinirole Hcl 1 Mg Tabs (Ropinirole hcl) .... One by mouth at bedtime 9)  Strattera 80 Mg Caps (Atomoxetine hcl) .... One by mouth daily 10)  Bayer Childrens Aspirin 81 Mg Chew (Aspirin) .... One by mouth daily 11)  Seroquel Xr 50 Mg Xr24h-tab (Quetiapine fumarate) .... Please take one pill  Patient Instructions: 1)  Please schedule a follow-up appt for:  08/01/2009 at 10:00. 2)  You agreed to the following homework: 3)  Call Jossie to arrange weekly walking date.  Research scientist (physical sciences). 4)  Complete one task daily in your room environment that creates a more soothing / clean atmosphere. 5)  Consider reconnecting with [employer] family.

## 2010-07-04 NOTE — Assessment & Plan Note (Signed)
Summary: Meet new doctor: HTN, DM, depression, perioral lesions   Vital Signs:  Patient profile:   70 year old female Height:      63.5 inches Weight:      163 pounds BMI:     28.52 Temp:     98.4 degrees F oral Pulse rate:   85 / minute BP sitting:   113 / 72  Vitals Entered By: Jimmy Footman, CMA (March 16, 2010 2:01 PM) CC: Meet new doc Is Patient Diabetic? Yes Did you bring your meter with you today? No Pain Assessment Patient in pain? no        Primary Provider:  Myrtie Soman  MD  CC:  Meet new doc.  History of Present Illness: Patient needing medication refills.   1. HTN  Taking enalapril 5mg  once daily now. BP today is good today in clinic.  ROS: Denies headache, dizziness.  2. HLD LDL at last several visits were good, in the 70's.  3. DM Doesn't check sugars at home.  Denies any episodes of faintness.  A1c today is below target 6.7.  4. Depression/ADHD Patient speaks with Dr. Pascal Lux. Have been on Straterra and Seroquel.  Occ some anhedonia but watching grandchildren keep her busy. Denies any thoughts of hurting herself. Sleeps well, good appetite. Sees Dr. Kathrynn [facility] ([facility]) who manages her medications.   5. Swelling on bottom lip and sores around mouth. Noticed subtle changes over the past few days.  Denies any difficulty breathing, pain; may have possible tongue tingling.    Current Medications (verified): 1)  Simvastatin 40 Mg Tabs (Simvastatin) .... One By Mouth Daily For Cholesterol 2)  Glucophage 500 Mg  Tabs (Metformin Hcl) .Marland Kitchen.. 1 Tab By Mouth Daily For Diabetes 3)  Valtrex 1 Gm Tabs (Valacyclovir Hcl) .... One Tab By Mouth Daily. 4)  Enalapril Maleate 5 Mg Tabs (Enalapril Maleate) .Marland Kitchen.. 1 Tablet Daily. 5)  Ropinirole Hcl 1 Mg Tabs (Ropinirole Hcl) .... One By Mouth At Bedtime 6)  Strattera 80 Mg Caps (Atomoxetine Hcl) .... One By Mouth Daily 7)  Bayer Childrens Aspirin 81 Mg Chew (Aspirin) .... One By Mouth Daily 8)  Seroquel Xr 50 Mg  Xr24h-Tab (Quetiapine Fumarate) .... Please Take Two Pills At Bedtime.  [facility].  Allergies: 1)  ! Codeine  Social History: Widowed.  Lives with daughter, son-in-law and 5 grandchildren.   Likes to ballroom dance for fun.   Denies smoking/etoh.   M[employer]on.  Physical Exam  Head:  small, light areas of erythema at corners of mouth; lower lip may be slightly swollen Mouth:  no oral or pharyngeal lesions, including areas of erythema or vesicles Neck:  no cervical lymphadenopathy Lungs:  CTAB Heart:  RRR Extremities:  2+ radial and DP pulses; 0-1+ bilateral LE edema  Diabetes Management Exam:    Foot Exam (with socks and/or shoes not present):       Sensory-Pinprick/Light touch:          Left medial foot (L-4): normal          Left dorsal foot (L-5): normal          Left lateral foot (S-1): normal          Right medial foot (L-4): normal          Right dorsal foot (L-5): normal          Right lateral foot (S-1): normal       Nails:  Left foot: normal          Right foot: normal   Impression & Recommendations:  Problem # 1:  HYPERTENSION, BENIGN ESSENTIAL (ICD-401.1) Well controlled on current ACEi. Continue. May consider decreasing dose back to 1/2 tablet daily in future.  Her updated medication list for this problem includes:    Enalapril Maleate 5 Mg Tabs (Enalapril maleate) .Marland Kitchen... 1 tablet daily.  Orders: Comp Met-[facility] ([address]08-65784) [facility]- Est Level  3 (69629)  Problem # 2:  HYPERLIPIDEMIA (ICD-272.4) Continue statin. No need to check LDL today based on good control in past; will check once a year.   Orders: [facility]- Est Level  3 (52841)  Her updated medication list for this problem includes:    Simvastatin 40 Mg Tabs (Simvastatin) ..... One by mouth daily for cholesterol  Problem # 3:  DIABETES-TYPE 2 (ICD-250.00) A1c goal for this elderly patient will be less than 8 to prevent episodes of hypoglycemia. Will discontinue Glipizide  and just keep on glucophage. Asked patient to check sugars in 1 and then 2 weeks and to call if sugars begin to become elevated. Will consider increasing metformin at that time.   The following medications were removed from the medication list:    Glipizide 5 Mg Tabs (Glipizide) .Marland Kitchen... Take 2 tablets every day Her updated medication list for this problem includes:    Glucophage 500 Mg Tabs (Metformin hcl) .Marland Kitchen... 1 tab by mouth daily for diabetes    Enalapril Maleate 5 Mg Tabs (Enalapril maleate) .Marland Kitchen... 1 tablet daily.    Bayer Childrens Aspirin 81 Mg Chew (Aspirin) ..... One by mouth daily  Orders: A1C-[facility] (32440) Comp Met-[facility] (10272-53664) [facility]- Est Level  3 (40347)  Problem # 4:  DEPRESSIVE DISORDER, MAJOR RCR, UNSPECIFIED (ICD-296.30) Fair control. Being followed by [facility]. Will refill Seroquel.   Orders: [facility]- Est Level  3 (42595)  Problem # 5:  ADHD (ICD-314.01) Dr. Pascal Lux questions this diagnosis, however, patient stable on Straterra and on long-term, so will continue for now. Will investigate this more in future visits. Will ask for notes from Dr. Kathrynn Running.   Problem # 6:  UNSPECIFIED DISORDER OF SKIN&SUBCUTANEOUS TISSUE (ICD-709.9) Unsure what this is. Symptoms mild. Possibly due to new lip gloss patient started using. Asked her to discontinue and see if symptoms resolve. Concern for angioedema from enalapril. Asked patient to call clinic if symptoms worsen.   Orders: [facility]- Est Level  3 (63875)  Complete Medication List: 1)  Simvastatin 40 Mg Tabs (Simvastatin) .... One by mouth daily for cholesterol 2)  Glucophage 500 Mg Tabs (Metformin hcl) .Marland Kitchen.. 1 tab by mouth daily for diabetes 3)  Valtrex 1 Gm Tabs (Valacyclovir hcl) .... One tab by mouth daily. 4)  Enalapril Maleate 5 Mg Tabs (Enalapril maleate) .Marland Kitchen.. 1 tablet daily. 5)  Ropinirole Hcl 1 Mg Tabs (Ropinirole hcl) .... One by mouth at bedtime 6)  Strattera 80 Mg Caps (Atomoxetine hcl) .... One by mouth daily 7)   Bayer Childrens Aspirin 81 Mg Chew (Aspirin) .... One by mouth daily 8)  Seroquel Xr 50 Mg Xr24h-tab (Quetiapine fumarate) .... Please take two pills at bedtime.  per dr. [facility]rynn [facility] in [facility].  Patient Instructions: 1)  Please stop using the lip gloss and see if this improves your lip swelling/skin irritation. If the swelling gets worse, you have difficulty breathing, or your tongue starts to swell, then please call the clinic.  2)  Please stop taking the glipizide (twice a day  diabetes medicine). Please check your sugars in 1 week and then in 2 weeks when you first wake up. If it is higher than 150, please call the clinic, and we can adjust your medications.  Prescriptions: SEROQUEL XR 50 MG XR24H-TAB (QUETIAPINE FUMARATE) Please take two pills at bedtime.  Per Dr. [facility]nn [facility] in [facility].  #60 x 3   Entered and Authorized by:   Priscella Mann MD   Signed by:   Lucianne Muss Park MD on 03/16/2010   Method used:   [facility]ron[facility] to        [facility] [phone][facility] (retail)       [address]       [place], [place]  [address]       Ph: [phone] or [phone]       Fax: [phone]   RxID:   [id number] ROPINIROLE HCL 1 MG TABS (ROPINIROLE HCL) one by mouth at bedtime  #30 x 3   Entered and Authorized by:   Priscella Mann MD   Signed by:   Lucianne Muss Park MD on 03/16/2010   Method used:   Electronically to        [facility] [phone]* (retail)       [address]       [place], [place]  [address]       Ph: [phone] or [phone]       Fax: [phone]   RxID:   [id number] STRATTERA 80 MG CAPS (ATOMOXETINE HCL) one by mouth daily  #30 x 3   Entered and Authorized by:   Priscella Mann MD   Signed by:   Lucianne Muss Park MD on 03/16/2010   Method used:   Electronically to        [facility] [phone]* (retail)       [address]       [place], [place]  [address]       Ph: [phone] or [phone]       Fax: [phone]   RxID:   [id number] ENALAPRIL MALEATE 5  MG TABS (ENALAPRIL MALEATE) 1 tablet daily.  #30 x 3   Entered and Authorized by:   Priscella Mann MD   Signed by:   Lucianne Muss Park MD on 03/16/2010   Method used:   Electronically to        [facility] [phone]* (retail)       [address]       [place], [place]  [address]       Ph: [phone] or [phone]       Fax: [phone]   RxID:   [id number] GLUCOPHAGE 500 MG  TABS (METFORMIN HCL) 1 tab by mouth daily for diabetes  #30 x 3   Entered and Authorized by:   Priscella Mann MD   Signed by:   Lucianne Muss Park MD on 03/16/2010   Method used:   Electronically to        [facility] [phone]* (retail)       [address]       [place], [place]  [address]       Ph: [phone] or [phone]       Fax: [phone]   RxID:   [id number] SIMVASTATIN 40 MG TABS (SIMVASTATIN) one by mouth daily for cholesterol  #30 Tablet x 3   Entered and Authorized by:   Priscella Mann MD   Signed by:   Marylene Land  Madolyn Frieze MD on 03/16/2010   Method used:   Electronically to        [facility] [phone]* (retail)       [address]       [place], [place]  [address]       Ph: [phone] or [phone]       Fax: [phone]   RxID:   [id number]   Laboratory Results   Blood Tests   Date/Time Received: March 16, 2010 2:02 PM  Date/Time Reported: March 16, 2010 2:11 PM   HGBA1C: 6.7%   (Normal Range: Non-Diabetic - 3-6%   Control Diabetic - 6-8%)  Comments: ...............test performed by......Marland KitchenBonnie A. Swaz[place], MLS (ASCP)cm      Appended Document: Meet new doctor: HTN, DM, depression, perioral lesions Cr 1.09 and LFTs WNL.

## 2010-07-04 NOTE — Assessment & Plan Note (Signed)
Summary: FU/tcb   Vital Signs:  Patient profile:   70 year old female Height:      63.5 inches Weight:      155.1 pounds BMI:     27.14 Temp:     98.1 degrees F oral Pulse rate:   91 / minute BP sitting:   118 / 71  (left arm) Cuff size:   regular  Vitals Entered By: Garen Grams LPN (August 09, 6043 9:52 AM) CC: f/u BP Is Patient Diabetic? Yes Did you bring your meter with you today? No Pain Assessment Patient in pain? no        Primary Care Provider:  Myrtie Soman  MD  CC:  f/u BP.  History of Present Illness: 1. Diabetes last A1c: 6.1 1/11 medications: metformin, glipizide problems with medications?: no blood sugar testing frequency: occasionally, if she doesn't feel normal hypoglycemic events?:none subjective: doing well with this issue.  2. Hypertension last BP: 150/67 medications: enalapril started at last visit in Jan.  side effects from medications? none outside testing: no subjective: pleased with her BP improvement.   3. Depression/bipolar On strattera and seroquel. Seeing Dr. Pascal Lux as well. Starting CBT in addition. Continues to struggle with amotivation and depressed mood. Does not feel like this is doing much better, but not worse. Denies SI  Current Medications (verified): 1)  Simvastatin 40 Mg Tabs (Simvastatin) .... One By Mouth Daily For Cholesterol 2)  Glucophage 500 Mg  Tabs (Metformin Hcl) .Marland Kitchen.. 1 Tab By Mouth Daily For Diabetes 3)  Glipizide 5 Mg Tabs (Glipizide) .... Take 2 Tablets Every Day 4)  Valtrex 1 Gm Tabs (Valacyclovir Hcl) .... One Tab By Mouth Daily. 5)  Enalapril Maleate 5 Mg Tabs (Enalapril Maleate) .... One By Mouth Daily For Blood Pressure. 6)  Ropinirole Hcl 1 Mg Tabs (Ropinirole Hcl) .... One By Mouth At Bedtime 7)  Strattera 80 Mg Caps (Atomoxetine Hcl) .... One By Mouth Daily 8)  Bayer Childrens Aspirin 81 Mg Chew (Aspirin) .... One By Mouth Daily 9)  Seroquel Xr 50 Mg Xr24h-Tab (Quetiapine Fumarate) .... Please Take Two  Pills At Bedtime.  Per Dr[facility]. 10)  Prednisone 20 Mg Tabs (Prednisone) .... Take 2 Tabs Daily For 5 Days  Allergies (verified): 1)  ! Codeine  Past History:  Past Medical History: Last updated: 2009-06-20 Carotid stenosis s/p CEA on R 2006 diabetes - diagnosed about 5 years HTN HLD IBS CVA -  ~2006 Memory loss RLS Catact surgery about 2007 ADD Bipolar disorder Depression Genital herpes Glaucoma Cataracts  Past Surgical History: Last updated: 06-20-09 Basal/SCC removed - 02/18/2003,  Cardiac Cath - 05/02/2005 - pt think it was normal. carotid dopplers >80% stenosis R - 03/16/2005,  R CEA - 05/04/2005,  EEG 11/06 WNL - 04/23/2005  glaucoma surgery - 06/25/2005 cataract surgery  Family History: Last updated: 2009-06-20 Father died age 72 brain aneurysm angina,  Mother died age 37, breast, throat CA, DM, Heart dz,  Sister died age 58 from mesothelioma; had DM, heart dz, kidney dz, Another Sister living age 32 w DM/ kidney dz  Social History: Last updated: 06-20-09 Widowed. 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 Lives with daughter, son-in-law and 5 grandchildren.  Likes to dance for fun.  Denies smoking/etoh.  [employer].  Review of Systems       ROS: denies CP, SOB, fever, chills, bowel or bladder problems, polydipsia/polyuria   Physical Exam  Additional Exam:  General:  Vital  signs reviewed -- normal Alert, appropriate; well-dressed and well-nourished Lungs:  work of breathing unlabored, clear to auscultation bilaterally; no wheezes, rales, or ronchi; good air movement throughout Heart:  regular rate and rhythm, no murmurs; normal s1/s2 Pulses:  DP and radial pulses 2+ bilaterally  Extremities:  no cyanosis, clubbing, or edema; small rough wart on medial aspect of 4th toe on L foot. No erythema, drainage, or discharge. Neurologic:  alert and oriented. speech normal. Psych: slightly dysphoric affect, good use of  humor, good eye contact. Normal speech. Not grandiose. Denies SI.    Impression & Recommendations:  Problem # 1:  DIABETES-TYPE 2 (ICD-250.00) Assessment Unchanged  continue metformin. doing well.  Her updated medication list for this problem includes:    Glucophage 500 Mg Tabs (Metformin hcl) .Marland Kitchen... 1 tab by mouth daily for diabetes    Glipizide 5 Mg Tabs (Glipizide) .Marland Kitchen... Take 2 tablets every day    Enalapril Maleate 5 Mg Tabs (Enalapril maleate) ..... One by mouth daily for blood pressure.    Bayer Childrens Aspirin 81 Mg Chew (Aspirin) ..... One by mouth daily  Orders: [facility]- Est  Level 4 (35361)  Problem # 2:  HYPERTENSION, BENIGN ESSENTIAL (ICD-401.1) Assessment: Improved  continue enalapril. doing well.   Her updated medication list for this problem includes:    Enalapril Maleate 5 Mg Tabs (Enalapril maleate) ..... One by mouth daily for blood pressure.  Orders: [facility]- Est  Level 4 (44315)  Problem # 3:  DEPRESSIVE DISORDER, MAJOR RCR, UNSPECIFIED (ICD-296.30) Assessment: Unchanged  continue current therapy. I wonder if SNRI might be a good adjunctive measure for this pt. But will defer to Dr. Pascal Lux as she is only beginning to work with the patient. No SI.   Orders: [facility]- Est  Level 4 (40086)  Complete Medication List: 1)  Simvastatin 40 Mg Tabs (Simvastatin) .... One by mouth daily for cholesterol 2)  Glucophage 500 Mg Tabs (Metformin hcl) .Marland Kitchen.. 1 tab by mouth daily for diabetes 3)  Glipizide 5 Mg Tabs (Glipizide) .... Take 2 tablets every day 4)  Valtrex 1 Gm Tabs (Valacyclovir hcl) .... One tab by mouth daily. 5)  Enalapril Maleate 5 Mg Tabs (Enalapril maleate) .... One by mouth daily for blood pressure. 6)  Ropinirole Hcl 1 Mg Tabs (Ropinirole hcl) .... One by mouth at bedtime 7)  Strattera 80 Mg Caps (Atomoxetine hcl) .... One by mouth daily 8)  Bayer Childrens Aspirin 81 Mg Chew (Aspirin) .... One by mouth daily 9)  Seroquel Xr 50 Mg Xr24h-tab (Quetiapine fumarate)  .... Please take two pills at bedtime.  per dr. Kathrynn Running in [facility]. 10)  Prednisone 20 Mg Tabs (Prednisone) .... Take 2 tabs daily for 5 days  Patient Instructions: 1)  follow-up in 2 months 2)  continue your medicines as they are for now 3)  if you have problems getting in to see Dr. Pascal Lux or need to be seen between appointment with her, don't hesitate to call for an appointment with me.  4)  Your A1c is great, your cholesterol is well-controlled and all in all, things look great.   Prevention & Chronic Care Immunizations   Influenza vaccine: Fluvax 3+  (04/23/2007)    Tetanus booster: 10/01/2006: Tdap    Pneumococcal vaccine: Not documented    H. zoster vaccine: Not documented  Colorectal Screening   Hemoccult: Done.  (11/03/2003)   Hemoccult action/deferral: Not indicated  (06/17/2009)    Colonoscopy: Done.  (06/04/2004)   Colonoscopy due: 06/04/2014  Other Screening   Pap smear: Done.  (10/02/2005)    Mammogram: normal  (07/03/2007)   Mammogram due: 07/2008    DXA bone density scan: Done.  (05/04/2005)   Smoking status: never  (07/19/2009)  Diabetes Mellitus   HgbA1C: 6.1  (06/17/2009)    Eye exam: Not documented    Foot exam: yes  (06/17/2009)   High risk foot: Not documented   Foot care education: Not documented    Urine microalbumin/creatinine ratio: Not documented    Diabetes flowsheet reviewed?: Yes  Lipids   Total Cholesterol: Not documented   LDL: Not documented   LDL Direct: 72  (06/17/2009)   HDL: Not documented   Triglycerides: Not documented    SGOT (AST): 23  (06/17/2009)   SGPT (ALT): 17  (06/17/2009)   Alkaline phosphatase: 72  (06/17/2009)   Total bilirubin: 1.4  (06/17/2009)    Lipid flowsheet reviewed?: Yes   Progress toward LDL goal: Improved  Hypertension   Last Blood Pressure: 118 / 71  (08/08/2009)   Serum creatinine: 0.86  (06/17/2009)   Serum potassium 3.9  (06/17/2009)    Hypertension flowsheet reviewed?:  Yes   Progress toward BP goal: At goal  Self-Management Support :   Personal Goals (by the next clinic visit) :     Personal A1C goal: 7  (06/17/2009)     Personal blood pressure goal: 130/80  (06/17/2009)     Personal LDL goal: 70  (06/17/2009)    Diabetes self-management support: Written self-care plan  (06/17/2009)    Diabetes self-management support not done because: Good outcomes  (08/08/2009)    Hypertension self-management support: BP self-monitoring log, Written self-care plan, Education handout, Pre-printed educational material  (06/17/2009)    Hypertension self-management support not done because: Good outcomes  (08/08/2009)    Lipid self-management support: Written self-care plan  (06/17/2009)     Lipid self-management support not done because: Good outcomes  (08/08/2009)

## 2010-07-04 NOTE — Progress Notes (Signed)
   Phone Note Call from Patient   Caller: Patient Call For: [phone] Summary of Call: [facility] [address] [facility]atin. Initial call taken by: Britta Mccreedy mcgregor    Prescriptions: SIMVASTATIN 40 MG TABS (SIMVASTATIN) one by mouth daily for cholesterol  #30 Tablet x 1   Entered by:   Golden Circle RN   Authorized by:   Priscella Mann MD   Signed by:   Golden Circle RN on 02/21/2010   Method used:   [facility]ron[facility] to        [facility] [phone]* (retail)       [address]       [place], [place]  [address]       Ph: [phone] or [phone]       Fax: [phone]   RxID:   [id number]

## 2010-07-04 NOTE — Assessment & Plan Note (Signed)
Summary: [facility] follow-up   Primary Care Provider:  Myrtie Soman  MD   History of Present Illness: Patient is taking 100 mg of Seroquel at bedtime and her Strattera.  She volunteers that it is a "forgone conclusion that I am depressed.  And nothing has changed."  She also reports that she has been very busy with her 18 grandchildren and taking on additional responsibility when her daughter goes to nursing school.  She enjoys these activities.  Her main issue is getting out of the house in the a.m.  She is concerned about her health worsening if she doesn't get that "push" to get her out the door and moving.  She admits today (through repeated questioning) that she is taking 200 mg of caffeine twice a day and that this is effective.  Her [employer] faith allows her to take this for "medicinal" purposes.  She is still wishing for a prescribed stimulant like phentermine which she was on for a long time and was stopped several years ago.  Discussed behavioral and cognitive issues involved in her mood management.    Allergies: 1)  ! Codeine   Impression & Recommendations:  Problem # 1:  BIPOLAR DISORDER UNSPECIFIED (ICD-296.80)  Reviewed paper chart.  Discussed options.  Provided feedback.  Having been on stimulants in the past, her expectations for medical benefit are unrealistic.  She might be better managed by OTC caffeine in modest, safe doses coupled with modest structure.  To that end, we have offered her validation for her use of caffeine (for which she previously felt guilty) and have offered her psychotherapy follow-up to help change her thinking about the quality of her life.  Insight is well below average in that she reports negative things about her life / mood / function and on the heels of this, talks about all of the things that keep her busy and how much she enjoys them.  At our meeting's end, she does speculate that perhaps her past is what makes it hard for her to view her present in a  positive light.  [facility]  Complete Medication List: 1)  Simvastatin 40 Mg Tabs (Simvastatin) .... One by mouth daily for cholesterol 2)  Glucophage 500 Mg Tabs (Metformin hcl) .Marland Kitchen.. 1 tab by mouth daily for diabetes 3)  Glipizide 5 Mg Tabs (Glipizide) .... Take 2 tablets every day 4)  Valtrex 1 Gm Tabs (Valacyclovir hcl) .... One tab by mouth daily. 5)  Enalapril Maleate 5 Mg Tabs (Enalapril maleate) .... One by mouth daily for blood pressure. 6)  Ropinirole Hcl 1 Mg Tabs (Ropinirole hcl) .... One by mouth at bedtime 7)  Strattera 80 Mg Caps (Atomoxetine hcl) .... One by mouth daily 8)  Bayer Childrens Aspirin 81 Mg Chew (Aspirin) .... One by mouth daily 9)  Seroquel Xr 50 Mg Xr24h-tab (Quetiapine fumarate) .... Please take two pills at bedtime.  per dr. Kathrynn Running [facility]. 10)  Prednisone 20 Mg Tabs (Prednisone) .... Take 2 tabs daily for 5 days

## 2010-07-04 NOTE — Progress Notes (Signed)
   Phone Note Call from Patient   Caller: Patient Call For: [phone] Summary of Call: Patient wanted  to know if you called her and why.  Also question what she is to do when her meds run out next month.   Initial call taken by: Abundio Miu,  March 22, 2010 2:08 PM  Follow-up for Phone Call        LMOVM for pt to call back.  Will inform that----   03/22/2010 1:43 PM, Priscella Mann MD wrote: please inform patient that i re-filled her straterra with 2 refills but if she wants any more, she will have to make appointment with me, so please ask her to make appt for 2-3 months.  Follow-up by: Jone Baseman CMA,  March 24, 2010 3:46 PM

## 2010-07-04 NOTE — Assessment & Plan Note (Signed)
Summary: MDC follow-up   Primary Care Provider:  Myrtie Soman  MD   History of Present Illness: Patient brings her daughter to the visit today.  Her hope is that her daughter can explain better about her condition and the pharmacologic treatment that she needs.  Daughter reports that all five of her children have mood issues and / or AD/HD.  Treated by Dr. Claudette Laws at [facility].  They have done very well on Focalin.  Daughter reports Shawonda has trouble getting out of bed in the morning and doing the things she wants to do - other times is very busy.  Daughter gave Kye a few days worth of her child's Focalin XR and Ragen reported she felt great and was able to do everything she wanted to do.  She would like a prescription for this medication.  Allergies: 1)  ! Codeine   Impression & Recommendations:  Problem # 1:  BIPOLAR DISORDER UNSPECIFIED (ICD-296.80)  Long history of treating this illness with Enid Derry.  She has periods of improved function especially with structure.  Recommended therapy in the past as well as caffeine use which she had been using on her own to good effect.  Current report of difficulty may or may not be related to mood issues.  Family is very set on Focalin as a treatment.  See below for further information.  [id number]  Problem # 2:  ADHD (ICD-314.01)  Patient is on a medication for AD/HD.  Current report of symptoms sound more mood related than AD/HD but family is interested in [place].  Dr. Kathrynn Running recommended a second opinion from Dr. Grace Isaac or a psychiatrist well versed in the treatment of AD/HD - especially in this age range.  Focalin carries some cardiovascular risk and Dr. Kathrynn Running would like that a psychiatrist be the one to prescribe this medicine if indeed he or she finds it to be an appropriate treatment.  Adam seemed satisfied with this approach - expressing satisfaction with "moving forward."  Will be open to  further conversation if Venora has additional questions.    [id number]  Complete Medication List: 1)  Simvastatin 40 Mg Tabs (Simvastatin) .... One by mouth daily for cholesterol 2)  Glucophage 500 Mg Tabs (Metformin hcl) .Marland Kitchen.. 1 tab by mouth daily for diabetes 3)  Valtrex 1 Gm Tabs (Valacyclovir hcl) .... One tab by mouth daily. 4)  Enalapril Maleate 5 Mg Tabs (Enalapril maleate) .Marland Kitchen.. 1 tablet daily. 5)  Ropinirole Hcl 1 Mg Tabs (Ropinirole hcl) .... One by mouth at bedtime 6)  Strattera 80 Mg Caps (Atomoxetine hcl) .... One by mouth daily 7)  Bayer Childrens Aspirin 81 Mg Chew (Aspirin) .... One by mouth daily 8)  Seroquel Xr 50 Mg Xr24h-tab (Quetiapine fumarate) .... Please take two pills at bedtime.  per dr. Kathrynn Running in mood disorder clinic.  Patient Instructions: 1)  We discussed getting a second opinion for your treatment of AD/HD and mood issues.  Given your age, Dr. Kathrynn Running is not comfortable prescribing a stimulant medication. 2)  Seeing Dr. Grace Isaac is recommended because he knows your family so well.  You have his contact information. 3)  Dr. Dub Mikes has a private practice [phone] and Dr. Kathrynn Running recommends him as well.    Orders Added: 1)  Therapy 20-30 min- [facility] [[id number]]

## 2010-07-04 NOTE — Miscellaneous (Signed)
Summary: Problem list   Clinical Lists Changes

## 2010-07-04 NOTE — Assessment & Plan Note (Signed)
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  She is here today to discuss her medication and mood.  She stopped her 100 mg Lamictal (first prescribed 12/2006).  She stopped the Seroquel because it was making her too sleepy (a phone note indicated 100 mg was a good balance of tolerability and efficacy).  She was put on Prozac while away and then stopped that because it was interfering with her restless leg.  She was put on Strattera and reported to be helpful for concentration and focus.  She has been on it about a year.    Patient notes that the increased structure of her mission trip was very helpful in managing her mood.  She had plans to be very active upon her return but says "left to her own devices" she is very inactive and "hates" herself for this.  She notes that when she manages to get somewhere (i.e. 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  She is not "faking it."   Allergies: 1)  ! Codeine   Impression & Recommendations:  Problem # 1:  BIPOLAR DISORDER UNSPECIFIED (ICD-296.80)  PHQ-9 today is 10.  No suicidal ideation.  Affect wnl today.  Report of mood is depressed.  Biggest concern is lack of motivation / energy to pursue activities.  When she fails to do things her mood gets worse and the energy and motivation follow.  She recognizes the need for structure - to push through the inertia but has not found a way to do that as of yet (unless imposed by others).    Her thought content is not especially depressed.  She talks about several things that she enjoys.  Her thought process turns catastrophically negative (I am a hypocrite...a failure) when she does't meet her own expectations.  This is not likely a medication issue but rather a therapy issue.  Review of history reveals modest improvement on Seroquel.  Agreed to start  here again from a pharmacologic standpoint.  Dr. Kathrynn Running wrote for 50 mg.  No change in Strattera.  Will monitor closely through [facility] med clinic and [facility].    [id number]  Complete Medication List: 1)  Trazodone Hcl 50 Mg Tabs (Trazodone hcl) .Marland Kitchen.. 1 tab by mouth at bedtime as needed for sleep.  wear your cpap to help your sleep as well 2)  Simvastatin 40 Mg Tabs (Simvastatin) .... One by mouth daily for cholesterol 3)  Glucophage 500 Mg Tabs (Metformin hcl) .Marland Kitchen.. 1 tab by mouth daily for diabetes 4)  Glipizide 5 Mg Tabs (Glipizide) .... Take 2 tablets every day 5)  Requip 0.5 Mg Tabs (Ropinirole hcl) .Marland Kitchen.. 1 tab by mouth at bedtime 6)  Valtrex 1 Gm Tabs (Valacyclovir hcl) .... One tab by mouth daily. 7)  Enalapril Maleate 5 Mg Tabs (Enalapril maleate) .... One by mouth daily for blood pressure. 8)  Ropinirole Hcl 1 Mg Tabs (Ropinirole hcl) .... One by mouth at bedtime 9)  Strattera 80 Mg Caps (Atomoxetine hcl) .... One by mouth daily 10)  Bayer Childrens Aspirin 81 Mg Chew (Aspirin) .... One by mouth daily 11)  Seroquel Xr 50 Mg Xr24h-tab (Quetiapine fumarate) .... Please take one pill  Patient Instructions: 1)  Please schedule an appt in beh med clinic for 1/31 at 2:00.  Prepare to be  challenged!  If you want to change your behavior, you will need to change the way you think! 2)  Take Seroquel 50 mg in the evening (typically about 7 - 8 pm).  Do not take the Trazodone with Seroquel. 3)  Follow-up in [facility] Disorder Clinic for February 16th at 9:00. Prescriptions: SEROQUEL XR 50 MG XR24H-TAB (QUETIAPINE FUMARATE) Please take one pill  #30 x 2   Entered and Authorized by:   Spero Geralds PsyD   Signed by:   Spero Geralds PsyD on 06/22/2009   Method used:   Handwritten   RxID:   [id number]

## 2010-07-04 NOTE — Assessment & Plan Note (Signed)
Summary: reestablish care,tcb   Vital Signs:  Patient profile:   70 year old female Height:      63.5 inches Weight:      151.7 pounds BMI:     26.55 Temp:     98.0 degrees F oral Pulse rate:   81 / minute BP sitting:   155 / 82  (left arm) Cuff size:   regular  Vitals Entered By: Garen Grams LPN (June 17, 2009 1:41 PM)  Serial Vital Signs/Assessments:  Time      Position  BP       Pulse  Resp  Temp     By                     159/90                         Myrtie Soman  MD  CC: re-establish care, f/u dm Is Patient Diabetic? Yes Did you bring your meter with you today? No Pain Assessment Patient in pain? yes     Location: left foot   Primary Care Provider:  Myrtie Soman  MD  CC:  re-establish care and f/u dm.  History of Present Illness: seeking to reestablish care after an 52-month mission in [place], [place]  1. bipolar disorder/depression States her bipolar is "not manic." Had been on Seroquel 300 but did not tolerate the sedation from this. No longer taking lamictal (not sure why--doesn't think it was working). Off prozac as well due to side effects. Has been on Strattera to treat her bipolar disorder. Feels like this is fairly effective for stabilizing her mood. Of note, pt was diagnosed with ADHD in 1985. Is scheduled tosee Dr. Pascal Lux 06/22/09 to establish with her in [facility].   2. diabetes States sugars are fairly well controlled although a little worse through the holidays. Last a1c was about 3 months ago -- think it was under 7. No problems with sensation in her feet.   3. HTN  Previously on lisinopril for BP. had some dizziness and stopped taking this with improvement in her symptoms. Not sure what her goal BP should be.   4. CVD Has had two "minstrokes" a few years ago. After these a carotid artery stenosis was found on the right - felt completely better after CEA on R side in 2006. Has never had a heart attack. Had cardiac cath in 2006 that she thinks was  normal. Takes a baby aspirin daily. Hasn't had cardiology follow-up in at least 2 years .  5. painful toe Has an irritating bumpo on the inside of her 4th toe on her left foot. Rubs when she walks.  medicines:  takes glipizide, metformin, strattera 80, trazodone, requip, simvastatin not sure of doses except strattera [facility] [address] [facility] she gets her meds) and is used further reconcile her list.  Habits & Providers  Alcohol-Tobacco-Diet     Tobacco Status: never  Current Medications (verified): 1)  Trazodone Hcl 50 Mg  Tabs (Trazodone Hcl) .Marland Kitchen.. 1 Tab By Mouth At Bedtime As Needed For Sleep.  Wear Your Cpap To Help Your Sleep As Well 2)  Simvastatin 40 Mg Tabs (Simvastatin) .... One By Mouth Daily For Cholesterol 3)  Glucophage 500 Mg  Tabs (Metformin Hcl) .Marland Kitchen.. 1 Tab By Mouth Daily For Diabetes 4)  Glipizide 5 Mg Tabs (Glipizide) .... Take 2 Tablets Every Day 5)  Requip 0.5 Mg  Tabs (Ropinirole  Hcl) .... 1 Tab By Mouth At Bedtime 6)  Valtrex 1 Gm Tabs (Valacyclovir Hcl) .... One Tab By Mouth Daily. 7)  Enalapril Maleate 5 Mg Tabs (Enalapril Maleate) .... One By Mouth Daily For Blood Pressure. 8)  Ropinirole Hcl 1 Mg Tabs (Ropinirole Hcl) .... One By Mouth At Bedtime 9)  Strattera 80 Mg Caps (Atomoxetine Hcl) .... One By Mouth Daily  Allergies (verified): 1)  ! Codeine  Past History:  Past Medical History: Carotid stenosis s/p CEA on R 2006 diabetes - diagnosed about 5 years HTN HLD IBS CVA -  ~2006 Memory loss RLS Catact surgery about 2007 ADD Bipolar disorder Depression Genital herpes Glaucoma Cataracts  Past Surgical History: Basal/SCC removed - 02/18/2003,  Cardiac Cath - 05/02/2005 - pt think it was normal. carotid dopplers >80% stenosis R - 03/16/2005,  R CEA - 05/04/2005,  EEG 11/06 WNL - 04/23/2005  glaucoma surgery - 06/25/2005 cataract surgery  Family History: Father died age 76 brain aneurysm angina,  Mother died age 73, breast,  throat CA, DM, Heart dz,  Sister died age 41 from mesothelioma; had DM, heart dz, kidney dz, Another Sister living age 1 w DM/ kidney dz  Social History: Widowed. Recently returned form 68 month [employer] mission in [place], [place]. Lives with daughter, son-in-law and 5 grandchildren.  Likes to dance for fun.  Denies smoking/etoh.  [employer].  Review of Systems       denies any chest pain, SOB. No problems with appetite. Has some intermittent urinary incontinence. Has not had an HSV outbreak in over 18 months.  Physical Exam  Additional Exam:  General:  Vital signs reviewed -- hypertensive Alert, appropriate; well-dressed and well-nourished Lungs:  work of breathing unlabored, clear to auscultation bilaterally; no wheezes, rales, or ronchi; good air movement throughout Heart:  regular rate and rhythm, no murmurs; normal s1/s2 Pulses:  DP and radial pulses 2+ bilaterally  Extremities:  no cyanosis, clubbing, or edema; small rough wart on medial aspect of 4th toe on L foot. No erythema, drainage, or discharge. Neurologic:  alert and oriented. speech normal.   Diabetes Management Exam:    Foot Exam (with socks and/or shoes not present):       Sensory-Pinprick/Light touch:          Left medial foot (L-4): normal          Left dorsal foot (L-5): normal          Left lateral foot (S-1): normal          Right medial foot (L-4): normal          Right dorsal foot (L-5): normal          Right lateral foot (S-1): normal       Sensory-Monofilament:          Left foot: normal          Right foot: normal       Inspection:          Left foot: normal          Right foot: normal       Nails:          Left foot: normal          Right foot: normal   Impression & Recommendations:  Problem # 1:  BIPOLAR AFFECTIVE DISORDER, MANIC (ICD-296.40) Assessment Unchanged  Sounds like bipolar II. Pt reports mood stable on strattera, although I'm not familiar with using this to treat these symptoms. Will look  forward to further evaluation by Dr. Pascal Lux. Pt is off lamictal and seroquel for many months. Was on prozacl for depression but has been off this for many months as well due to side effects and a question of interaction with other meds. Follow.  Orders: [facility]- Est  Level 4 ([address]214)  Problem # 2:  DIABETES-TYPE 2 (ICD-250.00) Assessment: Unchanged a1c indicates good control. Foot exam done today. Needs eye exam in the near future. Will try another ACEi. Discussed goal BP in the setting of diabetes.  The following medications were removed from the medication list:    Lisinopril 5 Mg Tabs (Lisinopril) .Marland Kitchen... 1 tab by mouth daily Her updated medication list for this problem includes:    Glucophage 500 Mg Tabs (Metformin hcl) .Marland Kitchen... 1 tab by mouth daily for diabetes    Glipizide 5 Mg Tabs (Glipizide) .Marland Kitchen... Take 2 tablets every day    Enalapril Maleate 5 Mg Tabs (Enalapril maleate) ..... One by mouth daily for blood pressure.    Bayer Childrens Aspirin 81 Mg Chew (Aspirin) ..... One by mouth daily  Orders: A1C-[facility] (35573) Direct LDL-[facility] ([address]-42706) CBC-[facility] (23762) Comp Met-[facility] (83[address]-76160) [facility]- Est  Level 4 (73710)  Problem # 3:  HYPERTENSION, BENIGN ESSENTIAL (ICD-401.1) Assessment: Deteriorated  Enalapril as previously mentioned. BP check in 2 weeks. follow-up in 4 weeks.  The following medications were removed from the medication list:    Lisinopril 5 Mg Tabs (Lisinopril) .Marland Kitchen... 1 tab by mouth daily Her updated medication list for this problem includes:    Enalapril Maleate 5 Mg Tabs (Enalapril maleate) ..... One by mouth daily for blood pressure.  Orders: [facility]- Est  Level 4 (62694)  Problem # 4:  TRANSIENT ISCHEMIC ATTACKS, HX OF (ICD-V12.50) Assessment: Unchanged  encouraged follow-up soon with Dr. Allyson Sabal. Continue ASA 81 mg. discussed the importance of good BP control with this history and diabetes  Orders: [facility]- Est  Level 4 ([address]462)  Problem # 5:  PLANTAR WART  (VOJ-500.93) Assessment: New  salicylic acid per instructions.  Orders: [facility]- Est  Level 4 (81829)  Complete Medication List: 1)  Trazodone Hcl 50 Mg Tabs (Trazodone hcl) .Marland Kitchen.. 1 tab by mouth at bedtime as needed for sleep.  wear your cpap to help your sleep as well 2)  Simvastatin 40 Mg Tabs (Simvastatin) .... One by mouth daily for cholesterol 3)  Glucophage 500 Mg Tabs (Metformin hcl) .Marland Kitchen.. 1 tab by mouth daily for diabetes 4)  Glipizide 5 Mg Tabs (Glipizide) .... Take 2 tablets every day 5)  Requip 0.5 Mg Tabs (Ropinirole hcl) .Marland Kitchen.. 1 tab by mouth at bedtime 6)  Valtrex 1 Gm Tabs (Valacyclovir hcl) .... One tab by mouth daily. 7)  Enalapril Maleate 5 Mg Tabs (Enalapril maleate) .... One by mouth daily for blood pressure. 8)  Ropinirole Hcl 1 Mg Tabs (Ropinirole hcl) .... One by mouth at bedtime 9)  Strattera 80 Mg Caps (Atomoxetine hcl) .... One by mouth daily 10)  Bayer Childrens Aspirin 81 Mg Chew (Aspirin) .... One by mouth daily  Other Orders: TSH-[facility] ([id number])  Patient Instructions: 1)  Glad to have you back. 2)  It would be good to call Dr. Hazle Coca office to get in with them in the next month or two. 3)  Start the enalapril for blood pressure. Come back for a blood pressure check in 2 weeks. 4)  We'll let you know if there are any problems with your blood work. 5)  follow-up with me in 4 weeks. 6)  For  your wart, apply salicylic acid (aka Compound W) once a day. Take off at night, soak your foot for 20 minutes in warm water, and then file down the area with an [employer]. Repeat this for 3 weeks.  7)  [facility] [address] Prescriptions: TRAZODONE HCL 50 MG  TABS (TRAZODONE HCL) 1 tab by mouth at bedtime as needed for sleep.  Wear your CPAP to help your sleep as well  #30 x 1   Entered and Authorized by:   Myrtie Soman  MD   Signed by:   Myrtie Soman  MD on 06/17/2009   Method used:   [facility] [phone][facility] (retail)        [address]       [place], [place]  [address]       Ph: [phone] or [phone]       Fax: [phone]   RxID:   [id number] GLUCOPHAGE 500 MG  TABS (METFORMIN HCL) 1 tab by mouth daily for diabetes  #30 x 2   Entered and Authorized by:   Myrtie Soman  MD   Signed by:   Myrtie Soman  MD on 06/17/2009   Method used:   [facility]        [facility] [phone]* (retail)       [address]       [place], [place]  [address]       Ph: [phone] or [phone]       Fax: [phone]   RxID:   [id number] STRATTERA 80 MG CAPS (ATOMOXETINE HCL) one by mouth daily  #30 x 2   Entered and Authorized by:   Myrtie Soman  MD   Signed by:   Myrtie Soman  MD on 06/17/2009   Method used:   [facility] to        [facility] [phone]* (retail)       [address]       [place], [place]  [address]       Ph: [phone] or [phone]       Fax: [phone]   RxID:   [id number] ROPINIROLE HCL 1 MG TABS (ROPINIROLE HCL) one by mouth at bedtime  #30 x 2   Entered and Authorized by:   Myrtie Soman  MD   Signed by:   Myrtie Soman  MD on 06/17/2009   Method used:   [facility]ically to        [facility] [phone]* (retail)       [address]       [place], [place]  [address]       Ph: [phone] or [phone]       Fax: [phone]   RxID:   [id number] SIMVASTATIN 40 MG TABS (SIMVASTATIN) one by mouth daily for cholesterol  #30 x 2   Entered and Authorized by:   Myrtie Soman  MD   Signed by:   Myrtie Soman  MD on 06/17/2009   Method used:   [facility] to        [facility] [phone]* (retail)       [address]       [place], [place]  [address]       Ph: [phone] or [phone]       Fax: [phone]   RxID:   [id number] GLIPIZIDE 5 MG TABS (GLIPIZIDE) take 2 tablets every day  #60 x  2   Entered and Authorized by:   Myrtie Soman  MD   Signed by:   Myrtie Soman  MD on 06/17/2009   Method used:   [facility]ically to        [facility] [phone]*  (retail)       [address]       [place], [place]  [address]       Ph: [phone] or [phone]       Fax: [phone]   RxID:   [id number] ENALAPRIL MALEATE 5 MG TABS (ENALAPRIL MALEATE) one by mouth daily for blood pressure.  #30 x 2   Entered and Authorized by:   Myrtie Soman  MD   Signed by:   Myrtie Soman  MD on 06/17/2009   Method used:   [facility]ronically to        [facility] [phone]* (retail)       [address]       [place], [place]  [address]       Ph: [phone] or [phone]       Fax: [phone]   RxID:   [id number]   Laboratory Results   Blood Tests   Date/Time Received: June 17, 2009 1:36 PM  Date/Time Reported: June 17, 2009 1:56 PM   HGBA1C: 6.1%   (Normal Range: Non-Diabetic - 3-6%   Control Diabetic - 6-8%)  Comments: ...........test performed by...........Marland KitchenTerese Door, CMA      Prevention & Chronic Care Immunizations   Influenza vaccine: Fluvax 3+  (04/23/2007)    Tetanus booster: 10/01/2006: Tdap    Pneumococcal vaccine: Not documented    H. zoster vaccine: Not documented  Colorectal Screening   Hemoccult: Done.  (11/03/2003)   Hemoccult action/deferral: Not indicated  (06/17/2009)    Colonoscopy: Done.  (06/04/2004)   Colonoscopy due: 06/04/2014  Other Screening   Pap smear: Done.  (10/02/2005)    Mammogram: normal  (07/03/2007)   Mammogram due: 07/2008    DXA bone density scan: Done.  (05/04/2005)   Smoking status: never  (06/17/2009)  Diabetes Mellitus   HgbA1C: 6.1  (06/17/2009)    Eye exam: Not documented    Foot exam: yes  (06/17/2009)   High risk foot: Not documented   Foot care education: Not documented    Urine microalbumin/creatinine ratio: Not documented    Diabetes flowsheet reviewed?: Yes   Progress toward A1C goal: Unchanged  Lipids   Total Cholesterol: Not documented   LDL: Not documented   LDL Direct: 61  (02/14/2007)   HDL: Not documented   Triglycerides: Not documented     SGOT (AST): 26  (12/10/2006)   SGPT (ALT): 26  (12/10/2006) CMP ordered    Alkaline phosphatase: 74  (12/10/2006)   Total bilirubin: 1.5  (12/10/2006)    Lipid flowsheet reviewed?: Yes   Progress toward LDL goal: Unchanged  Hypertension   Last Blood Pressure: 155 / 82  (06/17/2009)   Serum creatinine: 1.03  (12/10/2006)   Serum potassium 4.6  (12/10/2006) CMP ordered     Hypertension flowsheet reviewed?: Yes   Progress toward BP goal: Deteriorated  Self-Management Support :   Personal Goals (by the next clinic visit) :     Personal A1C goal: 7  (06/17/2009)     Personal blood pressure goal: 130/80  (06/17/2009)     Personal LDL goal: 70  (06/17/2009)    Diabetes self-management support: Written self-care plan  (06/17/2009)   Diabetes care plan printed    Hypertension self-management  support: BP self-monitoring log, Written self-care plan, Education handout, Pre-printed educational material  (06/17/2009)   Hypertension self-care plan printed.   Hypertension education handout printed    Lipid self-management support: Written self-care plan  (06/17/2009)   Lipid self-care plan printed.

## 2010-07-04 NOTE — Consult Note (Signed)
Summary: [facility]   Imported By: De Nurse 02/03/2010 12:11:57  _____________________________________________________________________  External Attachment:    Type:   Image     Comment:   External Document

## 2010-07-04 NOTE — Consult Note (Signed)
Summary: [facility]   Imported By: Clydell Hakim 07/27/2009 15:59:54  _____________________________________________________________________  External Attachment:    Type:   Image     Comment:   External Document  Appended Document: [facility]     Clinical Lists Changes  Observations: Added new observation of EYE EXAM: normal (07/27/2009 16:02)       Prevention & Chronic Care Immunizations   Influenza vaccine: Fluvax 3+  (04/23/2007)    Tetanus booster: 10/01/2006: Tdap    Pneumococcal vaccine: Not documented    H. zoster vaccine: Not documented  Colorectal Screening   Hemoccult: Done.  (11/03/2003)   Hemoccult action/deferral: Not indicated  (06/17/2009)    Colonoscopy: Done.  (06/04/2004)   Colonoscopy due: 06/04/2014  Other Screening   Pap smear: Done.  (10/02/2005)    Mammogram: normal  (07/03/2007)   Mammogram due: 07/2008    DXA bone density scan: Done.  (05/04/2005)   Smoking status: never  (07/19/2009)  Diabetes Mellitus   HgbA1C: 6.1  (06/17/2009)    Eye exam: Not documented    Foot exam: yes  (06/17/2009)   High risk foot: Not documented   Foot care education: Not documented    Urine microalbumin/creatinine ratio: Not documented  Lipids   Total Cholesterol: Not documented   LDL: Not documented   LDL Direct: 72  (06/17/2009)   HDL: Not documented   Triglycerides: Not documented    SGOT (AST): 23  (06/17/2009)   SGPT (ALT): 17  (06/17/2009)   Alkaline phosphatase: 72  (06/17/2009)   Total bilirubin: 1.4  (06/17/2009)  Hypertension   Last Blood Pressure: 150 / 67  (07/19/2009)   Serum creatinine: 0.86  (06/17/2009)   Serum potassium 3.9  (06/17/2009)  Self-Management Support :   Personal Goals (by the next clinic visit) :     Personal A1C goal: 7  (06/17/2009)     Personal blood pressure goal: 130/80  (06/17/2009)     Personal LDL goal: 70  (06/17/2009)    Diabetes self-management  support: Written self-care plan  (06/17/2009)    Hypertension self-management support: BP self-monitoring log, Written self-care plan, Education handout, Pre-printed educational material  (06/17/2009)    Lipid self-management support: Written self-care plan  (06/17/2009)

## 2010-07-04 NOTE — Progress Notes (Signed)
Summary: Question about meds   Phone Note Call from Patient   Caller: Patient Call For: Spero Geralds, Psy.D. Summary of Call: Patient left VM stating that a specialist wants to put her on Prednisone for about a week to help with some hearing loss she has had.  Before she starts this medication, the doctor asked that she clear it with [facility].  Specifically - will there be any problems taking it with her Seroquel (100 mg) and Strattera (80 mg).  Will check with Dr. Kathrynn Running. Initial call taken by: Spero Geralds PsyD,  November 29, 2009 12:14 PM  Follow-up for Phone Call        Per Dr. Kathrynn Running:  "Prednisone may worsen mood in any "direction." This is relatively uncommon even in mood patients, but happens often enough that I discuss this possibility with my patients. Any prior experience she has had with prednisone may be helpful as a predictor. No specific drug-drug interaction with a short taper. If her mood worsens, she should discontinue the prednisone."  I called and provided Brittany Chung with this information.  Follow-up by: Spero Geralds PsyD,  November 29, 2009 1:49 PM

## 2010-07-04 NOTE — Progress Notes (Signed)
Summary: Request [facility] appt   Phone Note Call from Patient   Caller: Patient Call For: Spero Geralds, Psy.D. Summary of Call: Analiah called to schedule an [facility] appt which we did for first available:  November 2nd at 10:00.  The conversation was rambling.  She stated there has been a lot going on - her daughter (with whom she lives) apparently just gave birth to her six child.  Destany said this daughter attended a doctor's appt a while back and wonders whether it was in [facility] (it wasn't).  Niylah mentioned something about AD/HD running in the family and trying her grandchild's focalin.  The immediate release did not work so well but she liked the extended release.  I reminded her that she is on a medicine for AD/HD (Strattera).  She would like to discuss further at her appt.  Not sure if she wants her daughter to attend the appt with her or not.  Would question anxiety more than AD/HD at this point as the reason for her difficulty staying focused. Initial call taken by: Spero Geralds PsyD,  March 06, 2010 4:16 PM

## 2010-07-04 NOTE — Miscellaneous (Signed)
Summary: RX  Prescriptions: SEROQUEL XR 50 MG XR24H-TAB (QUETIAPINE FUMARATE) Please take two pills at bedtime.  Per Dr. Kathrynn Running in [facility].  #60 x 1   Entered and Authorized by:   Zachery Dauer MD   Signed by:   Zachery Dauer MD on 01/26/2010   Method used:   Electronically to        [phone][facility] (retail)       [address]       [place], [place]  [address]       Ph: [phone] or [phone]       Fax: [phone]   RxID:   [id number]  Patient needs refill of Seriquel.  [facility] [address]. Bradly Bienenstock  January 26, 2010 3:23 PM   will forward message to preceptor. Theresia Lo RN  January 26, 2010 5:11 PM  I sent this in. She should see Dr Madolyn Frieze latter part of September for diabetes follow-up. Dr Pascal Lux, is she due for follow-up with you? Who will be responsible for dosing her Seroquel? Zachery Dauer MD  January 26, 2010 5:41 PM  Appended Document: RX patient notified to follow up with Dr. Madolyn Frieze latter part of Sept.  Appended Document: RX Per Dr. Kathrynn Running, patient had reached maximum efficacy from [facility] and was tolerating her regimen fine.  Her issues would best be addressed in a therapy environment and that was communicated to her.  Our hope would be that once stable, the PCP could take over prescribing the medicine with the option to re-refer if anything changed.  If this won't work out, please let me know.  Appended Document: RX Probably best to let Dr Madolyn Frieze meet the patient then she can determine if she's comfortable managing the Seroquel with preceptor advice.

## 2010-07-04 NOTE — Miscellaneous (Signed)
Summary: chart update   Clinical Lists Changes  Problems: Removed problem of RHUS DERMATITIS (ICD-692.6) Removed problem of NEED PROPH VACC&INOCULAT AGAINST VIRAL HEP (ICD-V05.3) Removed problem of EXPOSURE TO VIRAL DISEASE, NEC (ICD-V01.79) Removed problem of DYSPHAGIA, UNSPECIFIED (ICD-787.20) Removed problem of BIPOLAR AFFECTIVE DISORDER, MANIC (ICD-296.40) Removed problem of VAGINAL PRURITUS (ICD-698.1) Removed problem of VAGINITIS, ATROPHIC (ICD-627.3) Removed problem of UPPER RESPIRATORY INFECTION, VIRAL (ICD-465.9) Observations: Added new observation of PRIMARY MD: Myrtie Soman  MD (11/14/2009 11:55)

## 2010-07-04 NOTE — Progress Notes (Signed)
  Phone Note Call from Patient   Caller: Patient Summary of Call: I received a return call from the patient on 11/28/2009. We discussed her right shoulder x-ray results and she was advised to continue the current management plan. Initial call taken by: Valarie Merino MD,  November 28, 2009

## 2010-07-04 NOTE — Progress Notes (Signed)
Summary: triage   Phone Note Call from Patient Call back at Home Phone [phone]   Caller: Patient Summary of Call: felt shoulder crack/pop last night and is in pain - wants to come in today Initial call taken by: De Nurse,  November 23, 2009 8:40 AM  Follow-up for Phone Call        she thinks it is dislocated. it happened when she turned over. took tylenol, then aleve . up all night with the pain. states she can be here in 20 minutes. placed in wi Follow-up by: Golden Circle RN,  November 23, 2009 8:45 AM

## 2010-07-04 NOTE — Consult Note (Signed)
Summary: [facility]  [facility]   Imported By: Clydell Hakim 11/30/2009 15:48:03  _____________________________________________________________________  External Attachment:    Type:   Image     Comment:   External Document

## 2010-07-04 NOTE — Progress Notes (Signed)
Summary: Lab Res/see message/ts   Phone Note Call from Patient Call back at Home Phone [phone]   Caller: Patient Summary of Call: Checking on labs from this week. Initial call taken by: Clydell Hakim,  November 18, 2009 4:52 PM  Follow-up for Phone Call        will forward to Luretha Murphy.  Follow-up by: Theresia Lo RN,  November 18, 2009 5:06 PM  Additional Follow-up for Phone Call Additional follow up Details #1::        called pt. lmvm to call back. please tell pt per S.Saxon: labs are normal, f/up with new pcp. waiting for call back. Additional Follow-up by: Arlyss Repress CMA,,  November 21, 2009 10:03 AM    Additional Follow-up for Phone Call Additional follow up Details #2::    LMOVM again.  Will await callback Follow-up by: Jone Baseman CMA,  November 22, 2009 10:31 AM  Additional Follow-up for Phone Call Additional follow up Details #3:: Details for Additional Follow-up Action Taken: Pt informed while in clinic today Additional Follow-up by: Jone Baseman CMA,  November 23, 2009 9:45 AM

## 2010-07-04 NOTE — Assessment & Plan Note (Signed)
Summary: [facility] Follow-[facility]   Primary Care Provider:  Myrtie Soman  MD   History of Present Illness: Patient reports still having good benefit with Seroquel in terms of sleep.  Difficulty with motivation and energy.  She reports these things have gotten worse with an increase in anhedonia as well.  Her thought process is more negative, per her report.  She associates a worsening with the lack of structure in her life.    Allergies: 1)  ! Codeine   Impression & Recommendations:  Problem # 1:  BIPOLAR DISORDER UNSPECIFIED (ICD-296.80) Well groomed.  She has poison oak in her eye and looks uncomfortable.  Mood is reported as depressed.  Affect appears wnl.  She reports she can "put on a good show."  Discussed options.  Seroquel has ample evidence of anti-depressant activity.  She has moved up on the dose in the past and did not notice much benefit prior to having difficulty with tolerability at 300 mg.  With the Strattera on board, Dr. Kathrynn Running recommended that increasing the dose to 150 mg might be beneficial.  The Strattera / Seroquel combination might offer something different than what she experienced before.  She was open to this idea.  Also discussed increasing structure through her church.  She will consider talking with her Bishop again to see if she can become more involved.  Discussed the possibility that the Prednisone might push her mood / energy.  She is aware of this possibility and will monitor.  [id number]  Complete Medication List: 1)  Simvastatin 40 Mg Tabs (Simvastatin) .... One by mouth daily for cholesterol 2)  Glucophage 500 Mg Tabs (Metformin hcl) .Marland Kitchen.. 1 tab by mouth daily for diabetes 3)  Glipizide 5 Mg Tabs (Glipizide) .... Take 2 tablets every day 4)  Valtrex 1 Gm Tabs (Valacyclovir hcl) .... One tab by mouth daily. 5)  Enalapril Maleate 5 Mg Tabs (Enalapril maleate) .... One by mouth daily for blood pressure. 6)   Ropinirole Hcl 1 Mg Tabs (Ropinirole hcl) .... One by mouth at bedtime 7)  Strattera 80 Mg Caps (Atomoxetine hcl) .... One by mouth daily 8)  Bayer Childrens Aspirin 81 Mg Chew (Aspirin) .... One by mouth daily 9)  Seroquel Xr 50 Mg Xr24h-tab (Quetiapine fumarate) .... Please take two pills at bedtime.  per dr. Kathrynn Running in [facility]. 10)  Prednisone 20 Mg Tabs (Prednisone) .... Take 2 tabs daily for 5 days  Patient Instructions: 1)  Please schedule a follow-up for March 23rd at 11:00. 2)  Take two Seroquel at night.  If this is too much for you, you can go back to one Seroquel.  We are trying to find an optimal dose. Prescriptions: SEROQUEL XR 50 MG XR24H-TAB (QUETIAPINE FUMARATE) Please take two pills at bedtime.  Per Dr. Kathrynn Running in [facility].  #60 x 5   Entered and Authorized by:   Spero Geralds PsyD   Signed by:   Spero Geralds PsyD on 07/20/2009   Method used:   Handwritten   RxID:   [id number]

## 2010-07-04 NOTE — Assessment & Plan Note (Signed)
Summary: R ARM TEAR PER [facility]   Vital Signs:  Patient profile:   70 year old female BP sitting:   144 / 81  Vitals Entered By: Lillia Pauls CMA (November 23, 2009 2:56 PM)  Primary Provider:  Myrtie Soman  MD   History of Present Illness: DOI: 11/22/09  Reports to address acute R shoulder pain.  HPI per Dr. Jeanice Lim note confirmed: Patient was in the process of propping herself by positioning her elbow against a firm surface, transmitting a somewhat axial load to her right shoulder. She felt pain early during this process of propping herself up. No popping or tearing sensation. No sweling or bruising. No neck pain or paresthesias. Felt pain mostly over antero-lateral shoulder (including greater tuberosity area) and posterior right shoulder. No radiation of pain. Her arm did not buckle but her pain caused her to stop her propping motion.  Pain worst on  general movement. Pain decreased on  rest. Overally, pain is slightly better.  Ice provided some relief. Tylenol provided some relief.   No past R shoulder x-rays. No past R shoulder injuries. No past right shoulder procedures.  Routine Activities: Volunteers for the [employer]   Allergies: 1)  ! Codeine PMH-FH-SH reviewed for relevance  Physical Exam  General:  Well-developed,well-nourished,in no acute distress; alert,appropriate and cooperative throughout examination Msk:  NECK: FROM w/o pain.  RIGHT SHOULDER: No erythema, bruising, or swelling.  Mild ttp over right SST distribution mostly lateral and at GT insertion.  Mod ttp over R AC joint. Normal appearance of R AC joint w/o step-off.  Mild generalized ttp posterior R shoulder.  Mild pain on R empty can test.  FROM w/rproduced pain past 60 degrees abd/flex.  Near full strength throughout [facility] testing bilaterally.  Negative R drop arm.  Mild pain on R Speed's along posterolateral aspect. (-) R Yerguson's.  No apparent labral instability.  (+) hawkins  w/mod pain along posterlateral aspect. Neurologic:  Normal nv exam throughout the upper extremities. Additional Exam:  Musculoskeletal Ultrasound: Longitudinal and transverse views of the shoulder revealed the following:  Biceps Tendon: Normal. AC Joint: Mild to mod DJD with small spur formation. Subscapularis: No apparent tear. Question of mildly degenerative apearance. Supraspinatus: No apparent tear. Question of mildly degenerative appearance. Infraspinatus: Intact. Teres Minor: Intact. Deltoid: Intact. Humeral Head: Slight irregularity of greater trochanter which appears to be of chronic nature.   Impression & Recommendations:  Problem # 1:  SHOULDER PAIN, RIGHT (ICD-719.41) Possible partial RCT vs. RC strain.  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  - Right shoulder x-rays to r/o acute bony abnormality. Will call patient with x-ray findings. - No overhead activities or heavy lifting. ROM below the plane of the shoulder as tolerated. - Patient prefers to avoid Rx medications for this issue. - RTC in 2 weeks. Immediately seek MD attention for worsening symptoms, new symptoms, or other concerns.  Her updated medication list for this problem includes:    Bayer Childrens Aspirin 81 Mg Chew (Aspirin) ..... One by mouth daily  Orders: Radiology other (Radiology Other) [place] LIMITED [phone])  Complete Medication List: 1)  Simvastatin 40 Mg Tabs (Simvastatin) .... One by mouth daily for cholesterol 2)  Glucophage 500 Mg Tabs (Metformin hcl) .Marland Kitchen.. 1 tab by mouth daily for diabetes 3)  Glipizide 5 Mg Tabs (Glipizide) .... Take 2 tablets every day 4)  Valtrex 1 Gm Tabs (Valacyclovir hcl) .... One tab by mouth daily. 5)  Enalapril Maleate 5 Mg Tabs (Enalapril  maleate) .... 1/2 tab daily (bp low) 6)  Ropinirole Hcl 1 Mg Tabs (Ropinirole hcl) .... One by mouth at bedtime 7)  Strattera 80 Mg Caps (Atomoxetine hcl) .... One by mouth daily 8)  Bayer Childrens Aspirin 81 Mg Chew  (Aspirin) .... One by mouth daily 9)  Seroquel Xr 50 Mg Xr24h-tab (Quetiapine fumarate) .... Please take two pills at bedtime.  per dr. Kathrynn Running in [facility].

## 2010-07-04 NOTE — Miscellaneous (Signed)
  Medications Added STRATTERA 80 MG CAPS (ATOMOXETINE HCL) one by mouth daily       Clinical Lists Changes  Medications: Changed medication from STRATTERA 80 MG CAPS (ATOMOXETINE HCL) one by mouth daily to STRATTERA 80 MG CAPS (ATOMOXETINE HCL) one by mouth daily - Signed Rx of STRATTERA 80 MG CAPS (ATOMOXETINE HCL) one by mouth daily;  #7 x 0;  Signed;  Entered by: Sarah Swaziland MD;  Authorized by: Sarah Swaziland MD;  Method used: Historical    Prescriptions: STRATTERA 80 MG CAPS (ATOMOXETINE HCL) one by mouth daily  #7 x 0   Entered and Authorized by:   Sarah Swaziland MD   Signed by:   Sarah Swaziland MD on 03/09/2010   Method used:   Historical   RxID:   [id number]

## 2010-07-04 NOTE — Progress Notes (Signed)
Summary: Triage phone: itching   Phone Note Call from Patient   Call For: [phone] Summary of Call: ?Poison ivy to facial area.  More so to eyes.  Itching and burning sensation. Initial call taken by: Abundio Miu,  March 10, 2010 10:14 AM  Follow-up for Phone Call        started x 1 wk. worse over past few days. states her eyes are very swollen. she pets her chickens & there is poison ivy in the yard. advised use of UC as we do not have any appt left. she agreed. states last time she had this she was on prednisone Follow-up by: Golden Circle RN,  March 10, 2010 10:18 AM

## 2010-07-04 NOTE — Consult Note (Signed)
Summary: [facility]  [facility]   Imported By: Bradly Bienenstock 01/04/2010 12:55:52  _____________________________________________________________________  External Attachment:    Type:   Image     Comment:   External Document

## 2010-07-04 NOTE — Assessment & Plan Note (Signed)
Summary: shoulder pain/[place]/everhart   Vital Signs:  Patient profile:   70 year old female Height:      63.5 inches Weight:      157 pounds BMI:     27.47 BSA:     1.76 Temp:     98.7 degrees F Pulse rate:   79 / minute BP sitting:   139 / 73  Vitals Entered By: Jone Baseman CMA (November 23, 2009 9:42 AM) CC: shoulder pain x 1day Is Patient Diabetic? No Pain Assessment Patient in pain? yes     Location: right shoulder Intensity: 3   Primary Care Provider:  Myrtie Soman  MD  CC:  shoulder pain x 1day.  History of Present Illness: Right handed 68 WF.  1) Right shoulder pain: x 1 day. Was in process of propping self up (while lying in in bed) on right elbow, then immediately felt pain over lateral shoulder and her arm "gave way". Pain worsened last night. Has tried ice w/ some relief, aleve w/ some relief, tylenol w/ little relief. Pain occasionally radiates to right elbow. No prior history of surgery or injury to right shoulder. 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   Habits & Providers  Alcohol-Tobacco-Diet     Tobacco Status: never  Current Medications (verified): 1)  Simvastatin 40 Mg Tabs (Simvastatin) .... One By Mouth Daily For Cholesterol 2)  Glucophage 500 Mg  Tabs (Metformin Hcl) .Marland Kitchen.. 1 Tab By Mouth Daily For Diabetes 3)  Glipizide 5 Mg Tabs (Glipizide) .... Take 2 Tablets Every Day 4)  Valtrex 1 Gm Tabs (Valacyclovir Hcl) .... One Tab By Mouth Daily. 5)  Enalapril Maleate 5 Mg Tabs (Enalapril Maleate) .... 1/2 Tab Daily (Bp Low) 6)  Ropinirole Hcl 1 Mg Tabs (Ropinirole Hcl) .... One By Mouth At Bedtime 7)  Strattera 80 Mg Caps (Atomoxetine Hcl) .... One By Mouth Daily 8)  Bayer Childrens Aspirin 81 Mg Chew (Aspirin) .... One By Mouth Daily 9)  Seroquel Xr 50 Mg Xr24h-Tab (Quetiapine Fumarate) .... Please Take Two Pills At Bedtime.  Per Dr. Kathrynn Running in [facility].  Allergies (verified): 1)  ! Codeine  Review of Systems       as per HPI o/w  negative   Physical Exam  General:  pleasant, NAD, wearing right arm sling for comfort.  Msk:  RIGHT SHOULDER: no obvious deformities or skin changes noted; neurovascularly intact distal to injury; no pain with palpation at [facility] [place]  +painful arc sign  + drop arm sign  + weak with external rotation + empty can sign w/ pain and weakness  Pulses:  2+ radials    Impression & Recommendations:  Problem # 1:  SHOULDER PAIN, RIGHT (ICD-719.41) Assessment New  Acute. Suspect rotator cuff tendinopathy / tear given mechanism of injury and physical exam findings. Will refer to Sports Medicine for evaluation this week with ultrasound to determine extent of injury. Pain control w/ Tylenol, Aleve. Ice as needed. Advised ROM exercises as tolerated when pain improves to prevent loss of mobility.   Her updated medication list for this problem includes:    Bayer Childrens Aspirin 81 Mg Chew (Aspirin) ..... One by mouth daily  Orders: [facility]- Est Level  3 ([id number])  Complete Medication List: 1)  Simvastatin 40 Mg Tabs (Simvastatin) .... One by mouth daily for cholesterol 2)  Glucophage 500 Mg Tabs (Metformin hcl) .Marland Kitchen.. 1 tab by mouth daily for diabetes 3)  Glipizide 5 Mg Tabs (Glipizide) .... Take 2 tablets every  day 4)  Valtrex 1 Gm Tabs (Valacyclovir hcl) .... One tab by mouth daily. 5)  Enalapril Maleate 5 Mg Tabs (Enalapril maleate) .... 1/2 tab daily (bp low) 6)  Ropinirole Hcl 1 Mg Tabs (Ropinirole hcl) .... One by mouth at bedtime 7)  Strattera 80 Mg Caps (Atomoxetine hcl) .... One by mouth daily 8)  Bayer Childrens Aspirin 81 Mg Chew (Aspirin) .... One by mouth daily 9)  Seroquel Xr 50 Mg Xr24h-tab (Quetiapine fumarate) .... Please take two pills at bedtime.  per dr. Kathrynn Running in [facility].  Patient Instructions: 1)  Take Tylenol as needed for pain.  2)  Make an appointment to be seen in the [facility] this week.

## 2010-07-04 NOTE — Assessment & Plan Note (Signed)
Summary: poison oak in eye,df   Vital Signs:  Patient profile:   70 year old female Height:      63.5 inches Weight:      154 pounds BMI:     26.95 BSA:     1.74 Temp:     97.8 degrees F Pulse rate:   85 / minute BP sitting:   150 / 67  Vitals Entered By: Jone Baseman CMA, (July 19, 2009 3:42 PM)  Serial Vital Signs/Assessments:  Time      Position  BP       Pulse  Resp  Temp     By                     148/82                         Zachery Dauer MD  CC: ? poison oak in eye 4 days Pain Assessment Patient in pain? yes     Location: right eye Intensity: 5   Primary Care Provider:  Myrtie Soman  MD  CC:  ? poison oak in eye 4 days.  History of Present Illness: history of severe reactions to poison oak. They had a dead treat with the vines on it taken down. She guesses some toxin got on her shoes and was transferred to her right eye 3 days ago. 2 days of itching and swelling and couldn't open the right eye for awhile this AM. Took 2 Benadryl last PM without drowsiness or relief. No other rash, but her grandson has a small patch of rash on his face.   Is taking her Enalapril without problems.   Her DM has been well controlled recently.   Has an opthalmology field exam tomorrow.   Habits & Providers  Alcohol-Tobacco-Diet     Tobacco Status: never  Current Medications (verified): 1)  Simvastatin 40 Mg Tabs (Simvastatin) .... One By Mouth Daily For Cholesterol 2)  Glucophage 500 Mg  Tabs (Metformin Hcl) .Marland Kitchen.. 1 Tab By Mouth Daily For Diabetes 3)  Glipizide 5 Mg Tabs (Glipizide) .... Take 2 Tablets Every Day 4)  Valtrex 1 Gm Tabs (Valacyclovir Hcl) .... One Tab By Mouth Daily. 5)  Enalapril Maleate 5 Mg Tabs (Enalapril Maleate) .... One By Mouth Daily For Blood Pressure. 6)  Ropinirole Hcl 1 Mg Tabs (Ropinirole Hcl) .... One By Mouth At Bedtime 7)  Strattera 80 Mg Caps (Atomoxetine Hcl) .... One By Mouth Daily 8)  Bayer Childrens Aspirin 81 Mg Chew (Aspirin) ....  One By Mouth Daily 9)  Seroquel Xr 50 Mg Xr24h-Tab (Quetiapine Fumarate) .... Please Take One Pill 10)  Prednisone 20 Mg Tabs (Prednisone) .... Take 2 Tabs Daily For 5 Days  Allergies (verified): 1)  ! Codeine  Physical Exam  Head:  Reddened and puffy edema right periorbital area. Not tender.  Eyes:  right pupil slightly irregularpupils equal, pupils reactive to light, corneas and lenses clear, no injection, and no optic disk abnormalities.   Mouth:  Oral mucosa and oropharynx without lesions or exudates.  edentulous.   Skin:  No other rash on exposed skin.  Psych:  subdued.     Impression & Recommendations:  Problem # 1:  RHUS DERMATITIS (ICD-692.6)  Most likely due to rhus toxin, consider other contactant. Use over the counter Loratidine if persistent itching.  Her updated medication list for this problem includes:    Prednisone 20 Mg Tabs (Prednisone) .Marland Kitchen... Take 2  tabs daily for 5 days  Orders: [facility]- Est Level  3 ([address])  Problem # 2:  DIABETES-TYPE 2 (ICD-250.00)  Has been very well controlled. She'll monitor her capillary blood glucose and call if they become very elevated on the Prednisone Her updated medication list for this problem includes:    Glucophage 500 Mg Tabs (Metformin hcl) .Marland Kitchen... 1 tab by mouth daily for diabetes    Glipizide 5 Mg Tabs (Glipizide) .Marland Kitchen... Take 2 tablets every day    Enalapril Maleate 5 Mg Tabs (Enalapril maleate) ..... One by mouth daily for blood pressure.    Bayer Childrens Aspirin 81 Mg Chew (Aspirin) ..... One by mouth daily  Orders: [facility]- Est Level  3 ([address])  Complete Medication List: 1)  Simvastatin 40 Mg Tabs (Simvastatin) .... One by mouth daily for cholesterol 2)  Glucophage 500 Mg Tabs (Metformin hcl) .Marland Kitchen.. 1 tab by mouth daily for diabetes 3)  Glipizide 5 Mg Tabs (Glipizide) .... Take 2 tablets every day 4)  Valtrex 1 Gm Tabs (Valacyclovir hcl) .... One tab by mouth daily. 5)  Enalapril Maleate 5 Mg Tabs (Enalapril maleate) .... One  by mouth daily for blood pressure. 6)  Ropinirole Hcl 1 Mg Tabs (Ropinirole hcl) .... One by mouth at bedtime 7)  Strattera 80 Mg Caps (Atomoxetine hcl) .... One by mouth daily 8)  Bayer Childrens Aspirin 81 Mg Chew (Aspirin) .... One by mouth daily 9)  Seroquel Xr 50 Mg Xr24h-tab (Quetiapine fumarate) .... Please take one pill 10)  Prednisone 20 Mg Tabs (Prednisone) .... Take 2 tabs daily for 5 days  Patient Instructions: 1)  Call if fever, signs of infection, or not much improved by Monday.  2)  Take Benadryl tonight if needed for itching. Replace it with Loratidine if you continue to have persistent itching.  Prescriptions: PREDNISONE 20 MG TABS (PREDNISONE) Take 2 tabs daily for 5 days  #10 x 0   Entered and Authorized by:   Zachery Dauer MD   Signed by:   Zachery Dauer MD on 07/19/2009   Method used:   [facility] [phone][facility] (retail[facility]       [address]       [place], [place]  [address]       Ph: [phone] or [phone]       Fax: [phone]   RxID:   [id number]

## 2010-07-06 NOTE — Assessment & Plan Note (Signed)
Summary: needs derm referral,df   Vital Signs:  Patient profile:   70 year old female Weight:      161.9 pounds Pulse rate:   90 / minute BP sitting:   122 / 71  (right arm)  Vitals Entered By: Arlyss Repress CMA, (June 23, 2010 10:44 AM) CC: check lesion on right forehead. request referral to dermatologist. Is Patient Diabetic? No Pain Assessment Patient in pain? no        Primary Care Provider:  Myrtie Soman  MD  CC:  check lesion on right forehead. request referral to dermatologist..  History of Present Illness: Patient has been follow by Dr. Yetta Barre at [facility] derm for years, she had a new lesion pop up on her forhead and she is requesting referral back to him to remove this.  She is having major dental work done and at her last dental apt her BP was 160 range.  Habits & Providers  Alcohol-Tobacco-Diet     Tobacco Status: never  Current Medications (verified): 1)  Simvastatin 40 Mg Tabs (Simvastatin) .... One By Mouth Daily For Cholesterol 2)  Glucophage 500 Mg  Tabs (Metformin Hcl) .Marland Kitchen.. 1 Tab By Mouth Daily For Diabetes 3)  Valtrex 1 Gm Tabs (Valacyclovir Hcl) .... One Tab By Mouth Daily. 4)  Enalapril Maleate 5 Mg Tabs (Enalapril Maleate) .Marland Kitchen.. 1 Tablet Daily. 5)  Ropinirole Hcl 1 Mg Tabs (Ropinirole Hcl) .... One By Mouth At Bedtime 6)  Strattera 80 Mg Caps (Atomoxetine Hcl) .... One By Mouth Daily 7)  Bayer Childrens Aspirin 81 Mg Chew (Aspirin) .... One By Mouth Daily 8)  Seroquel Xr 50 Mg Xr24h-Tab (Quetiapine Fumarate) .... Please Take Two Pills At Bedtime.  [facility].  Allergies (verified): 1)  ! Codeine  Physical Exam  General:  In no distress Skin:  cutaneous horn on forhead   Impression & Recommendations:  Problem # 1:  ACTINIC KERATOSIS (ICD-702.0) pt request referral as she has had multiple AK removed from him. Orders: Dermatology Referral (Derma) [facility]- Est Level  2 ([address])  Problem # 2:  HYPERTENSION, BENIGN  ESSENTIAL (ICD-401.1)  122/71 today, may have been upset at the denties, patient will keep an eye on it. Her updated medication list for this problem includes:    Enalapril Maleate 5 Mg Tabs (Enalapril maleate) .Marland Kitchen... 1 tablet daily.  Orders: [facility]- Est Level  2 ([address])  Complete Medication List: 1)  Simvastatin 40 Mg Tabs (Simvastatin) .... One by mouth daily for cholesterol 2)  Glucophage 500 Mg Tabs (Metformin hcl) .Marland Kitchen.. 1 tab by mouth daily for diabetes 3)  Valtrex 1 Gm Tabs (Valacyclovir hcl) .... One tab by mouth daily. 4)  Enalapril Maleate 5 Mg Tabs (Enalapril maleate) .Marland Kitchen.. 1 tablet daily. 5)  Ropinirole Hcl 1 Mg Tabs (Ropinirole hcl) .... One by mouth at bedtime 6)  Strattera 80 Mg Caps (Atomoxetine hcl) .... One by mouth daily 7)  Bayer Childrens Aspirin 81 Mg Chew (Aspirin) .... One by mouth daily 8)  Seroquel Xr 50 Mg Xr24h-tab (Quetiapine fumarate) .... Please take two pills at bedtime.  per dr[facility] Kathrynn [facility].  Patient Instructions: 1)  Apt wth [place] to follow chronc conditons.   Orders Added: 1)  Dermatology Referral [Derma] 2)  [facility]- Est Level  2 [[address]]

## 2010-07-06 NOTE — Miscellaneous (Signed)
Summary: re: Dermatology Appt/TS  APPT WITH DR.STINEHELFER 07-03-10 AT 10:50AM [facility] [address] PH [phone] called pt and lmvm to call us back.Arlyss Repress CMA,  June 23, 2010 4:18 PM  called pt and informed of her appt.Arlyss Repress CMA,  June 26, 2010 11:09 AM

## 2010-07-06 NOTE — Letter (Signed)
Summary: Generic Letter  [facility]  [address]   [place], [place] [address]   Phone: [phone]  Fax: [phone]    06/29/2010  [address] [place], [place]  [address]  Dear Brittany Chung,  We are happy to let you know that since you are covered under Medicare you are able to have a FREE visit at the [facility] to discuss your HEALTH. This is a new benefit for Medicare.  There will be no co-payment.  At this visit you will meet with Brittany Chung an expert in wellness and the health coach at our clinic.  At this visit we will discuss ways to keep you healthy and feeling well.  This visit will not replace your regular doctor visit and we cannot refill medications.     You will need to plan to be here at least one hour to talk about your medical history, your current status, review all of your medications, and discuss your future plans for your health.  This information will be entered into your record for your doctor to have and review.  If you are interested in staying healthy, this type of visit can help.  Please call the office at: [phone], to schedule a "Medicare Wellness Visit".  The day of the visit you should bring in all of your medications, including any vitamins, herbs, over the counter products you take.  Make a list of all the other doctors that you see, so we know who they are. If you have any other health documents please bring them.  We look forward to helping you stay healthy.  Sincerely,   [facility]  i[facility]V

## 2010-07-07 ENCOUNTER — Ambulatory Visit (INDEPENDENT_AMBULATORY_CARE_PROVIDER_SITE_OTHER): Payer: MEDICARE | Admitting: Family Medicine

## 2010-07-07 ENCOUNTER — Encounter: Payer: Self-pay | Admitting: Family Medicine

## 2010-07-07 DIAGNOSIS — K089 Disorder of teeth and supporting structures, unspecified: Secondary | ICD-10-CM | POA: Insufficient documentation

## 2010-07-12 NOTE — Assessment & Plan Note (Signed)
Summary: dental pain   Vital Signs:  Patient profile:   70 year old female Weight:      158 pounds Temp:     98 degrees F oral Pulse rate:   86 / minute BP sitting:   113 / 74  (right arm)  Vitals Entered By: Arlyss Repress CMA, (July 07, 2010 3:33 PM) CC: had 3 teeth extracted 2 weeks ago. swelling of righ jaw and pain off and on up to 6/10 Is Patient Diabetic? Yes Pain Assessment Patient in pain? no        Primary Care Provider:  Myrtie Soman  MD  CC:  had 3 teeth extracted 2 weeks ago. swelling of righ jaw and pain off and on up to 6/10.  History of Present Illness: Had recent dental extraction by dentist. right lower jaw x 2 teeth.  Pt states that she has had a lot of pain at extraction site.  Pt was given vicodin and this made her sick.  This was changed to a nonnarcotic and still had nausea.  Has had significant pain and feels like she should not still be hurting.   Doing ok with extra strength tylenol.  saw dentist for same complaint last week but wanted to be evaluated today b/c she was concerned that the dentist may have "missed something."  Habits & Providers  Alcohol-Tobacco-Diet     Tobacco Status: never  Allergies: 1)  ! Codeine  Review of Systems       no fever. no pus/draninage from extraction site.  no jaw swelling.    Physical Exam  General:  Well-developed,well-nourished,in no acute distress; alert,appropriate and cooperative throughout examination Mouth:  poor dentition.  extraction site of right lower jaw noted.  no pus/drainage.  no fluctuation of gum in area.  no jaw inflammation.  no fever- temp 98.7   Impression & Recommendations:  Problem # 1:  DENTAL PAIN (ICD-525.9)  no signs or symptoms of infection.  Will have pt f/up with dentist who performed procedure to manage her pain.  currently taking only tylenol as needed. pt is scheduled to see dentist in upcoming 2-3 weeks.  encouraged pt to contact them sooner if any questions or concerns.   reviewed red flags for return with pt.    Orders: [facility]- Est Level  3 ([address])  Complete Medication List: 1)  Simvastatin 40 Mg Tabs (Simvastatin) .... One by mouth daily for cholesterol 2)  Glucophage 500 Mg Tabs (Metformin hcl) .Marland Kitchen.. 1 tab by mouth daily for diabetes 3)  Valtrex 1 Gm Tabs (Valacyclovir hcl) .... One tab by mouth daily. 4)  Enalapril Maleate 5 Mg Tabs (Enalapril maleate) .Marland Kitchen.. 1 tablet daily. 5)  Ropinirole Hcl 1 Mg Tabs (Ropinirole hcl) .... One by mouth at bedtime 6)  Strattera 80 Mg Caps (Atomoxetine hcl) .... One by mouth daily 7)  Bayer Childrens Aspirin 81 Mg Chew (Aspirin) .... One by mouth daily 8)  Seroquel Xr 50 Mg Xr24h-tab (Quetiapine fumarate) .... Please take two pills at bedtime.  per dr[facility] Kathrynn [facility].  Patient Instructions: 1)  Take tylenol as needed for pain 2)  return if any new or worsening signs and symptoms.     Orders Added: 1)  [facility]- Est Level  3 [[address]]

## 2010-07-20 NOTE — Miscellaneous (Signed)
Summary: RX REFILLS  Prescriptions: SEROQUEL XR 50 MG XR24H-TAB (QUETIAPINE FUMARATE) Please take two pills at bedtime.  Per Dr. Kathrynn [facility].  #60 x 3   Entered and Authorized by:   Priscella Mann MD   Signed by:   Lucianne Muss Park MD on 07/11/2010   Method used:   [facility] [phone][facility] (retail)       [address]       [place], [place]  [address]       Ph: [phone] or [phone]       Fax: [phone]   RxID:   [id number] STRATTERA 80 MG CAPS (ATOMOXETINE HCL) one by mouth daily  #30 x 2   Entered and Authorized by:   Priscella Mann MD   Signed by:   Lucianne Muss Park MD on 07/11/2010   Method used:   [facility]        [facility] [phone][facility] (retail)       [address]       [place], [place]  [address]       Ph: [phone] or [phone]       Fax: [phone]   RxID:   [id number] ROPINIROLE HCL 1 MG TABS (ROPINIROLE HCL) one by mouth at bedtime  #30 x 3   Entered and Authorized by:   Priscella Mann MD   Signed by:   Lucianne Muss Park MD on 07/11/2010   Method used:   [facility]        [facility] [phone]* (retail)       [address]       [place], [place]  [address]       Ph: [phone] or [phone]       Fax: [phone]   RxID:   [id number] ENALAPRIL MALEATE 5 MG TABS (ENALAPRIL MALEATE) 1 tablet daily.  #30 x 3   Entered and Authorized by:   Priscella Mann MD   Signed by:   Lucianne Muss Park MD on 07/11/2010   Method used:   [facility]ronically to        [facility] [phone]* (retail)       [address]       [place], [place]  [address]       Ph: [phone] or [phone]       Fax: [phone]   RxID:   [id number] VALTREX 1 GM TABS (VALACYCLOVIR HCL) One tab by mouth daily.  #30 x 11   Entered and Authorized by:   Priscella Mann MD   Signed by:   Lucianne Muss Park MD on 07/11/2010   Method used:   [facility]        [facility] [phone]* (retail)       [address]       [place], [place]   [address]       Ph: [phone] or [phone]       Fax: [phone]   RxID:   [id number] GLUCOPHAGE 500 MG  TABS (METFORMIN HCL) 1 tab by mouth daily for diabetes  #30 x 3   Entered and Authorized by:   Priscella Mann MD   Signed by:   Lucianne Muss Park MD on 07/11/2010   Method used:   [facility]        [facility]  [address] [id number] (retail)       [address]       [place], [place]  [address]       Ph: [phone] or [phone]       Fax: [phone]   RxID:   [id number] SIMVASTATIN 40 MG TABS (SIMVASTATIN) one by mouth daily for cholesterol  #30 Tablet x 3   Entered and Authorized by:   Priscella Mann MD   Signed by:   Lucianne Muss Park MD on 07/11/2010   Method used:   [facility]        [facility] [phone]* (retail)       [address]       [place], [place]  [address]       Ph: [phone] or [phone]       Fax: [phone]   RxID:   [id number]  Patient needs refill of medications.  [facility]   Brittany Chung  July 07, 2010 4:18 PM   Prescriptions sent. Called and informed patient.

## 2010-07-26 ENCOUNTER — Encounter (HOSPITAL_COMMUNITY): Payer: MEDICARE | Admitting: Physician Assistant

## 2010-07-26 DIAGNOSIS — F988 Other specified behavioral and emotional disorders with onset usually occurring in childhood and adolescence: Secondary | ICD-10-CM

## 2010-07-31 ENCOUNTER — Other Ambulatory Visit: Payer: Self-pay | Admitting: Family Medicine

## 2010-07-31 MED ORDER — QUETIAPINE FUMARATE ER 50 MG PO TB24: 100.0000 mg | ORAL_TABLET | Freq: Every day | ORAL | Status: DC

## 2010-07-31 NOTE — Progress Notes (Signed)
  Subjective:    Patient ID: Brittany Chung, female    DOB: April 09, 1941, 70 y.o.   MRN: [id number]  HPIRefilled via fax request    Review of Systems     Objective:   Physical Exam        Assessment & Plan:

## 2010-08-04 ENCOUNTER — Encounter: Payer: Self-pay | Admitting: Home Health Services

## 2010-08-04 ENCOUNTER — Ambulatory Visit (INDEPENDENT_AMBULATORY_CARE_PROVIDER_SITE_OTHER): Payer: MEDICARE | Admitting: Home Health Services

## 2010-08-04 VITALS — BP 135/76 | HR 94 | Temp 97.5°F | Ht 63.5 in | Wt 160.0 lb

## 2010-08-04 DIAGNOSIS — Z23 Encounter for immunization: Secondary | ICD-10-CM

## 2010-08-04 DIAGNOSIS — Z0000 Encounter for general adult medical examination without abnormal findings: Secondary | ICD-10-CM

## 2010-08-04 MED ORDER — INFLUENZA VAC TYP A&B SURF ANT IM INJ: 0.5000 mL | INJECTION | Freq: Once | INTRAMUSCULAR | Status: DC

## 2010-08-04 NOTE — Progress Notes (Signed)
Patient here for annual wellness visit, patient reports: Risk Factors/Conditions needing evaluation or treatment: Patient has a suspicious mark on her left forearm Patient reports blood in stool. Diet: Patient has a varied diet of protein, starch, and vegetables.  Recommened she increase her vegetables. Physical Activity: Patient reports working out 2 times a week.  Recommended she increase to 3 times a week. Home Safety: Patient lives with daughter and son-in-law and 5 grandchildren. Home is a 1 story building with smoke detectors.  Patient does not have adaptive equipment. End of Life Plan: Patient repots not having her will in place.  Gave patient advance directives pamphlet and reccommended she review with daughter.  Patient identified, Tama Headings, as her daughter emergency contact at [phone] Other Information: Patient has had cataract and glaucoma surgery and see fine but wears corrective lens. Patient reports recently having a swelling reaction to Clindamycin HCL. Patient reported that she recently stop taking her valacyclovir.   Balance max value patientvalue  Sitting balance 1 1  Arise 2 1  Attempts to arise 2 2  Immediate standing balance 2 2  Standing balance 1 1  Nudge 2 2  Eyes closed 1 1  360 degree turn 1 1  Sitting down 2 2   Gait max value patient value  Initiation of gait 1 1  Step length-left 1 1  Step length-right 1 1  Step height-left 1 1  Step height-right 1 1  Step symmetry 1 1  Step continuity 1 1  Path 2 2  Trunk 2 2  Walking stance 1 1   Balance/Gait Score: 25/26  Mental Status Exam max value patient value  Orientation to time 5 5  Orientation to place 5 5  Registration 3 3  Attention 5 5  Recall 3 2  Language (name 2 objects) 2 2  Language-repeat 1 1  Language-follow 3 step command 3 3  Language-read and follow directions 1 1   Mental Status Exam: 27/28   Annual Wellness Visit Requirements Recorded Today In  Medical, family,  social history Past Medical, Family, Social History Section  Current providers Care team  Current medications Medications  Wt, BP, Ht, BMI Vital signs  Tobacco, alcohol, illicit drug use History  ADL Nurse Assessment  Depression Screening Nurse Assessment  Cognitive impairment Nurse Assessment  Fall Risk Nurse Assessment  Home Safety Progress Note  End of Life Planning (welcome visit) Progress Note  Medicare preventative services Progress Note  Risk factors/conditions needing evaluation/treatment Progress Note  Personalized health advice Patient Instructions, goals, letter    Prevention Plan: Patient will schedule her mammogram. Patient will request cholesterol at next appointment with PCP. Gave patient pamphlet for zostavax vaccine.   Recommended Medicare Prevention Screenings Women over 17 Test For Frequency Date of Last- BOLD if needed  Breast Cancer 1-2 yrs 06/2007  Cervical Cancer 1-3 yrs 10/2005  Colorectal Cancer 1-10 yrs 06/2004  Osteoporosis once 05/2005  Cholesterol 5 yrs recommended  Diabetes yearly 03/2010  HIV yearly declined  Influenza Shot yearly 08/04/2010  Pneumonia Shot once recommended  Zostavax Shot once [employer]

## 2010-08-04 NOTE — Patient Instructions (Addendum)
1. Schedule an appointment for a mammography. 2. Schedule an appointment with Dr. Madolyn Frieze for colon bleeding, cholesterol panel, new drug allergy, and for stopping valacyclovir. 3. Review the shingles vaccine information and get vaccine at your pharmacy. 4. Try to exercise 3 times a week for 30 minutes- start dancing again! 5. Try to eat vegetables 3 times a day and fruits 2 times a day. 6. Work on losing 10 pounds by March 2013.

## 2010-08-07 ENCOUNTER — Ambulatory Visit: Payer: MEDICARE | Admitting: Family Medicine

## 2010-08-07 ENCOUNTER — Encounter: Payer: Self-pay | Admitting: Family Medicine

## 2010-08-07 NOTE — Progress Notes (Signed)
  Subjective:    Patient ID: Brittany Chung, female    DOB: Jul 23, 1940, 70 y.o.   MRN: [id number]  HPI    Review of Systems     Objective:   Physical Exam        Assessment & Plan:  I have reviewed this visit and discussed with Arlys John and agree with her documentation.

## 2010-08-08 ENCOUNTER — Encounter: Payer: Self-pay | Admitting: Family Medicine

## 2010-08-08 ENCOUNTER — Ambulatory Visit (INDEPENDENT_AMBULATORY_CARE_PROVIDER_SITE_OTHER): Payer: MEDICARE | Admitting: Family Medicine

## 2010-08-08 DIAGNOSIS — L989 Disorder of the skin and subcutaneous tissue, unspecified: Secondary | ICD-10-CM

## 2010-08-08 NOTE — Patient Instructions (Signed)
Do not take the clindamycin Hydrocortisone 1% cream bid Baby soft toothbrush and baby shampoo and wash your lids daily Antihistamine  Dermatologist apt next week  Nizorel cream for seb derm

## 2010-08-08 NOTE — Progress Notes (Signed)
  Subjective:    Patient ID: Brittany Chung, female    DOB: Jul 28, 1940, 70 y.o.   MRN: [id number]  HPI:  Red itchy rash on face and neck since she took clindamycin after dental work.  Sees a dermatologist and has an apt next week.  Not sure what to do, keeps touching face.  Rash is no where else, there is no mucus membrane involvement.    Review of Systems  Constitutional: Negative for fever and activity change.  HENT: Positive for facial swelling. Negative for congestion and neck pain.        Red, inflamed tissue around eyes, including lids and some of forhead  Eyes: Positive for itching. Negative for discharge.       Lids itching, not eyes   Musculoskeletal: Negative for arthralgias.  Neurological: Negative for light-headedness.       Objective:   Physical Exam  Constitutional:       Disheveled, alert, red faced   HENT:  Mouth/Throat: Oropharynx is clear and moist.  Eyes: Conjunctivae are normal.       Red, flaking, inflamed eye lids (symmetrical appearance)  Cardiovascular: Normal rate and regular rhythm.   Pulmonary/Chest: Effort normal.  Lymphadenopathy:    She has no cervical adenopathy.  Skin: Rash noted.       Facial rash as described, some macular/papular lesions on neck, no other rashes on trunk.  One lesion on right arm that she plans on having evaluated by derm this week.  Psychiatric:       Odd behavior, anxious          Assessment & Plan:

## 2010-08-08 NOTE — Assessment & Plan Note (Addendum)
Patient reports new problem but has been seen for red inflamed rash on face many times.  Appears to be more chronic and possess the symptoms based on location of seborrheic dermatitis.  She is seeing her dermatologist next week.  In the meantime I recommended OTC antihistamines, washing eye lids that are crusting with baby shampoo, apply a thin layer of OTC hydrocortisone 1 % cream.  Ketoconazole may be helpful but only wanted to do one thing at a time.

## 2010-08-10 ENCOUNTER — Other Ambulatory Visit: Payer: Self-pay | Admitting: Dermatology

## 2010-08-17 ENCOUNTER — Other Ambulatory Visit (HOSPITAL_COMMUNITY)
Admission: RE | Admit: 2010-08-17 | Discharge: 2010-08-17 | Disposition: A | Discharge status: Home / Self Care | Payer: MEDICARE | Source: Ambulatory Visit | Attending: Family Medicine | Admitting: Family Medicine

## 2010-08-17 DIAGNOSIS — Z01419 Encounter for gynecological examination (general) (routine) without abnormal findings: Secondary | ICD-10-CM | POA: Insufficient documentation

## 2010-08-18 ENCOUNTER — Ambulatory Visit (INDEPENDENT_AMBULATORY_CARE_PROVIDER_SITE_OTHER): Payer: MEDICARE | Admitting: Family Medicine

## 2010-08-18 ENCOUNTER — Encounter: Payer: Self-pay | Admitting: Family Medicine

## 2010-08-18 VITALS — BP 117/66 | HR 99 | Temp 98.5°F | Ht 63.0 in | Wt 152.4 lb

## 2010-08-18 DIAGNOSIS — A6000 Herpesviral infection of urogenital system, unspecified: Secondary | ICD-10-CM

## 2010-08-18 DIAGNOSIS — N760 Acute vaginitis: Secondary | ICD-10-CM | POA: Insufficient documentation

## 2010-08-18 DIAGNOSIS — N898 Other specified noninflammatory disorders of vagina: Secondary | ICD-10-CM

## 2010-08-18 DIAGNOSIS — L293 Anogenital pruritus, unspecified: Secondary | ICD-10-CM

## 2010-08-18 DIAGNOSIS — F909 Attention-deficit hyperactivity disorder, unspecified type: Secondary | ICD-10-CM

## 2010-08-18 DIAGNOSIS — Z7189 Other specified counseling: Secondary | ICD-10-CM | POA: Insufficient documentation

## 2010-08-18 DIAGNOSIS — R195 Other fecal abnormalities: Secondary | ICD-10-CM

## 2010-08-18 DIAGNOSIS — L989 Disorder of the skin and subcutaneous tissue, unspecified: Secondary | ICD-10-CM

## 2010-08-18 DIAGNOSIS — R319 Hematuria, unspecified: Secondary | ICD-10-CM

## 2010-08-18 DIAGNOSIS — E119 Type 2 diabetes mellitus without complications: Secondary | ICD-10-CM

## 2010-08-18 LAB — POCT URINALYSIS DIPSTICK
Ketones, UA: NEGATIVE
Nitrite, UA: NEGATIVE
Protein, UA: NEGATIVE
Urobilinogen, UA: 0.2
pH, UA: 5.5

## 2010-08-18 LAB — POCT WET PREP (WET MOUNT)
Trichomonas Wet Prep HPF POC: NEGATIVE
Yeast Wet Prep HPF POC: NEGATIVE

## 2010-08-18 LAB — POCT UA - MICROSCOPIC ONLY

## 2010-08-18 MED ORDER — FLUCONAZOLE 100 MG PO TABS: 150.0000 mg | ORAL_TABLET | Freq: Once | ORAL | Status: AC

## 2010-08-18 MED ORDER — MICONAZOLE NITRATE 2 % EX CREA: TOPICAL_CREAM | CUTANEOUS | Status: DC

## 2010-08-18 NOTE — Assessment & Plan Note (Signed)
Given information at health visit with Brittany Chung. Still process of filling out forms. Will bring at next visit or drop off sooner.

## 2010-08-18 NOTE — Assessment & Plan Note (Signed)
Likely yeast infection secondary to Clindamycin use (decreasing natural bacterial flora) and exacerbated by self-treatment with steroid cream. Will follow-up on wet prep but based on clear presentation of yeast (inflammed with satellite lesions), will treat for yeast infection now.

## 2010-08-18 NOTE — Assessment & Plan Note (Signed)
Improved with benadryl prn and stopping Clindamycin.

## 2010-08-18 NOTE — Assessment & Plan Note (Signed)
May be hemoccult positive. Hemoccult may have had slight color change but ambiguous. Patient without any GI symptoms at this time and 2006 colonoscopy normal. No family history of GI cancer. Will attribute now to tender perineum/rectum with developing fissure. Treating yeast infection now. Will follow-up. Discussed options with patients (about GI work-up and referral), and patient would not like this done at this time. Amenable to treating yeast infection and monitoring.

## 2010-08-18 NOTE — Progress Notes (Signed)
  Subjective:    Patient ID: Brittany Chung, female    DOB: July 25, 1940, 70 y.o.   MRN: [id number]  HPI Patient here for physical.  1. Vaginal discomfort Very itchy past few days. Very uncomfortable. Skin feels sensitive ever since reaction to antibiotic few weeks ago. Started re-taking Valtrex 1 week ago. Does not feel like herpes break-out. Has been using hydrocortisone cream for itchiness but has not helped.  Also some blood, unsure urine or stool.   ROS: no abdominal pain; no dysuria; patient has h/o IBS and has episodes of constipation and diarrhea, symptoms seem controlled right now with stool softeners; no recent episodes diarrhea  Had been on Clindamycin recently for dental infection but stopped abruptly 2/2 allergic reaction (rash).  2. Facial itching, swelling Rash has resolved but skin still feels dry and scaly.  Using baby shampoo and Vaseline. Has used baby shampoo for a while now due to sensitive skin.  Review of Systems     Objective:   Physical Exam  Constitutional: She is oriented to person, place, and time. She appears well-developed and well-nourished. No distress.  Cardiovascular: Normal rate, regular rhythm, normal heart sounds and intact distal pulses.   Abdominal: Soft. Bowel sounds are normal. She exhibits no distension and no mass. There is no tenderness.  Genitourinary: Uterus normal. Rectal exam shows external hemorrhoid. Rectal exam shows no fissure, no mass, no tenderness and anal tone normal. Guaiac positive stool (Ambiguous (?light color change)).    Cervix exhibits discharge (Thin white discharge). Right adnexum displays no tenderness. Left adnexum displays no tenderness.       Erythematous, tender, moist rash bilateral labia, extending to perineum and perianal.  Vaginal canal: erythematous and tender   Musculoskeletal: She exhibits no edema.  Neurological: She is alert and oriented to person, place, and time.  Skin: Skin is warm and dry.         Assessment & Plan:

## 2010-08-18 NOTE — Patient Instructions (Signed)
I think you have a yeast infection. I sent in a prescription for 2 medicines:  -fluconazole: take 1 tablet just 1 time  -yeast cream: use twice daily for 5 days.  Do not use the steroid cream.   For hemorrhoids:  -try Sitz baths after your yeast infection resolves (maybe in 2 weeks)  -then try hydrocortisone cream (OTC kind) for 1-2 weeks  Follow-up in 3 months.

## 2010-08-18 NOTE — Assessment & Plan Note (Signed)
Started taking Valtrex again 1 week ago.  Encouraged continue to take.

## 2010-08-18 NOTE — Progress Notes (Signed)
Addended by: [place], Artisha Capri on: 08/18/2010 08:53 PM   Modules accepted: Orders

## 2010-08-21 ENCOUNTER — Inpatient Hospital Stay (INDEPENDENT_AMBULATORY_CARE_PROVIDER_SITE_OTHER)
Admission: RE | Admit: 2010-08-21 | Discharge: 2010-08-21 | Disposition: A | Discharge status: Home / Self Care | Payer: MEDICARE | Source: Ambulatory Visit | Attending: Family Medicine | Admitting: Family Medicine

## 2010-08-21 DIAGNOSIS — L259 Unspecified contact dermatitis, unspecified cause: Secondary | ICD-10-CM

## 2010-08-23 ENCOUNTER — Encounter: Payer: Self-pay | Admitting: Family Medicine

## 2010-08-23 ENCOUNTER — Encounter (INDEPENDENT_AMBULATORY_CARE_PROVIDER_SITE_OTHER): Payer: MEDICARE | Admitting: Physician Assistant

## 2010-08-23 DIAGNOSIS — F988 Other specified behavioral and emotional disorders with onset usually occurring in childhood and adolescence: Secondary | ICD-10-CM

## 2010-08-31 ENCOUNTER — Telehealth: Payer: Self-pay | Admitting: *Deleted

## 2010-08-31 DIAGNOSIS — N898 Other specified noninflammatory disorders of vagina: Secondary | ICD-10-CM

## 2010-08-31 NOTE — Telephone Encounter (Signed)
Patient had an OV on 3/16 with Dr. Madolyn Frieze.  She c/o vaginal itching and abdominal pain.  Was diagnosed with a yeast infection and prescribed Fluconazole tablets and Miconazole cream.  Patient calling b/c she is still itching and says that Dr. Madolyn Frieze told her to call if not any better - she may need more medication.  She also called b/c she received a letter from Dr. Madolyn Frieze telling her that her pain was coming from the hernia and she needed to keep her appointment with the surgeon for evaluation.  Patient has no idea what she is talking about.  She does not have a hernia and wants to make sure we have not scheduled and appointment with a surgeon.  Told her I would route this note to Dr. Madolyn Frieze for clarification as well as the request for additional mediction.

## 2010-09-01 MED ORDER — FLUCONAZOLE 100 MG PO TABS: 150.0000 mg | ORAL_TABLET | Freq: Every day | ORAL | Status: AC

## 2010-09-01 NOTE — Telephone Encounter (Signed)
Patient called back again today asking if medication for yeast infection had been called in.  Told her I would page the doctor.  Dr. Madolyn Frieze paged at 11:45 am.

## 2010-09-01 NOTE — Telephone Encounter (Signed)
Spoke with patient.  Will send in diflucan x2 three days apart.  If patient not feeling better in 4-5 days, she needs to be seen again.  As best as I can tell, no one has made a referral to a surgeon.  Will defer further discussions about this to Dr. Madolyn Frieze.

## 2010-09-05 NOTE — Telephone Encounter (Signed)
Called patient. Went over lab results. Patient does not have hernia. I mistakenly wrote instructions for a different patient on her letter. I reassured patient that she does not have a hernia, and that it was my mistake. Patient's rash is not that much improved. She is on day 2 of her diflucan that was called in again.  Asked patient to make appointment end of this week/early next week to re-evaluate rash.   Right now treating vulvovaginitis as yeast infection. Wet prep though does not show yeast but appeared to be classical yeast infection with satellite lesions. May still be and exacerbated by atrophic vaginitis.  Wet prep also negative for trich.   Consider steroid cream, antihistamine, estrogen cream, antibiotics (possible may have overlying infection now although rash started after taking 1-2 days of clindamycin for dental infection).

## 2010-09-07 ENCOUNTER — Ambulatory Visit (INDEPENDENT_AMBULATORY_CARE_PROVIDER_SITE_OTHER): Payer: MEDICARE | Admitting: Family Medicine

## 2010-09-07 DIAGNOSIS — N760 Acute vaginitis: Secondary | ICD-10-CM

## 2010-09-07 NOTE — Patient Instructions (Signed)
Your yeast infection appears to be healing well. Keep a close eye on this area, if it gets worse or doesn't heal, make follow-up appt and will consider biopsy.

## 2010-09-07 NOTE — Progress Notes (Signed)
  Subjective:    Patient ID: Brittany Chung, female    DOB: [birth year], 70 y.o.   MRN: [id number]  HPIHere for follow-up of yeast infection: on 3.16 was terated by Dr Madolyn Frieze with miconazole cream and diflucan x 4 doses separated by 4 days.  Per previous note"  Likely yeast infection secondary to Clindamycin use (decreasing natural bacterial flora) and exacerbated by self-treatment with steroid cream. Will follow-up on wet prep but based on clear presentation of yeast (inflammed with satellite lesions), will treat for yeast infection now."  Patient states inflammation has been around since December 2011, but has been improving since treatment.  Notes vaginal discharge and dryness which she treated with vaseline and mineral oil.  No dysuria, abd pain.  Vaginal discharge improving- she feels it was only since the inflammation started.  See hpi Review of Systems     Objective:   Physical Exam  Constitutional: She is oriented to person, place, and time. She appears well-developed and well-nourished. No distress.  Abdominal: Soft. Bowel sounds are normal. She exhibits no distension and no mass. There is no tenderness.  Genitourinary:       Signs of prior yeast infection with erythematous, hyperpigmented skin with some cracking and scaling.  Vagina atrophic with some tenderness to palpation.     Musculoskeletal: She exhibits no edema.  Neurological: She is alert and oriented to person, place, and time.  Skin: Skin is warm and dry.          Assessment & Plan:

## 2010-09-07 NOTE — Assessment & Plan Note (Signed)
Possible etiologies include sensitivity to clindamycin gel, chronic moisture from vaginal discharge and vaginal moisturizers.  Appears to be healing today after diflucan.  Discussed topical estrogen for chronic vaginal dryness and discharge, patient declines.  Advised self-monitoring and if does not continue to heal, asked her to return for evaluation.  Would consider biopsy to further evaluate for causes of vulvar dermatitis.

## 2010-09-22 ENCOUNTER — Encounter (HOSPITAL_COMMUNITY): Payer: MEDICARE | Admitting: Physician Assistant

## 2010-09-22 DIAGNOSIS — F988 Other specified behavioral and emotional disorders with onset usually occurring in childhood and adolescence: Secondary | ICD-10-CM

## 2010-10-17 NOTE — Consult Note (Signed)
[facility]                          ENDOCRINOLOGY CONSULTATION   Brittany Chung, Brittany Chung                    MRN:          [id number]  DATE:01/22/2007                            DOB:          1940-08-04    REFERRING PHYSICIAN:  Santiago Bumpers. Leveda Anna, M.D.   REASON FOR REFERRAL:  Goiter.   HISTORY OF PRESENT ILLNESS:  A 70 year old woman who states difficulty  swallowing for the past two years.  An ultrasound noted her to have a  multinodular goiter.   PAST MEDICAL HISTORY:  1. Type 2 diabetes.  2. Dyslipidemia.  3. Mild dementia.  4. Insomnia.  5. Depression.  6. Diabetes nephropathy.  7. GERD.  8. Restless legs.   SOCIAL HISTORY:  She is widowed.  She is retired.   FAMILY HISTORY:  Negative for thyroid disease.   REVIEW OF SYSTEMS:  Denies shortness of breath.   PHYSICAL EXAMINATION:  VITAL SIGNS:  blood pressure is 110/61, heart  rate 72, temperature is 98.5, the weight is 175.  GENERAL:  No distress.  EYES:  No proptosis.  No periorbital swelling.  NECK:  I do not appreciate a goiter.   LABORATORY STUDIES:  TSH 1.37 on November 18, 2006.  Thyroid ultrasound  shows two small nodules on the left, one is a 4-mm solid nodule and the  other one is a 5-mm cyst.   IMPRESSION:  1. 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  2. Euthyroid.  3. Dysphagia which is not thyroid related given the minimal nature of      her thyroid enlargement.   PLAN:  1. We discussed the cause and natural history of a multinodular      goiter.  I have told her that the most common natural history is      the slow development of hyperthyroidism.  2. An ultrasound should be rechecked in 6 months and a TSH should be      checked annually      and p.r.n.  3. Return here on a p.r.n. basis.     Sean A. Everardo All, MD  Electronically Signed    SAE/MedQ  DD: 01/23/2007  DT: 01/24/2007  Job #: [id number]   cc:   William A. Leveda Anna, M.D.  Anselmo Rod, M.D.

## 2010-10-19 ENCOUNTER — Other Ambulatory Visit: Payer: Self-pay | Admitting: Family Medicine

## 2010-10-19 MED ORDER — ATOMOXETINE HCL 80 MG PO CAPS: 80.0000 mg | ORAL_CAPSULE | Freq: Every day | ORAL | Status: DC

## 2010-10-20 NOTE — Op Note (Signed)
Brittany Chung, Brittany Chung             ACCOUNT NO.:  [id number]   MEDICAL RECORD NO.:  [id number]          PATIENT TYPE:  INP   LOCATION:  3302                         FACILITY:  [facility]   PHYSICIAN:  Larina Earthly, M.D.    DATE OF BIRTH:  Oct 15, 1940   DATE OF PROCEDURE:  05/08/2005  DATE OF DISCHARGE:                                 OPERATIVE REPORT   PREOPERATIVE DIAGNOSIS:  Severe symptomatic right internal carotid artery  stenosis.   POSTOP DIAGNOSIS:  Severe symptomatic right internal carotid artery  stenosis.   PROCEDURE:  Right carotid endarterectomy, Dacron patch angioplasty.   SURGEON:  Larina Earthly, M.D.   ASSISTANT:  Stephanie Acre Dominick, [place].   ANESTHESIA:  General endotracheal.   COMPLICATIONS:  None.   DISPOSITION:  To recovery room neurologically intact.   PROCEDURE IN DETAIL:  The patient was taken to the operating room and placed  in the supine position where the right neck was prepped and draped in the  usual sterile fashion. Incision was made anterior to the sternocleidomastoid  and carried down through the platysma with electrocautery. The  sternocleidomastoid was resected posteriorly and the carotid sheath was  opened. Facial vein was ligated with 2-0 silk ties and divided. The vagus  and hypoglossal nerves were identified and preserved. The common carotid  artery was encircled with an umbilical tape and Rumel tourniquet.   Dissection was carried down to the bifurcation. The superior thyroid artery  was encircled with 2-0 silk Potts tie. The external carotid encircled with a  blue vessel loop and the internal carotid was encircled with an umbilical  tape for Rumel tourniquet. The patient was given 8000 units of intravenous  heparin and after circulation time the internal, external, and common  carotid arteries were occluded. The common carotid artery was opened with  the 11-blade and sewn down with Potts scissors through the plaque onto the  internal carotid.  A 10-shunt was passed up the internal carotid artery,  allowed to back bleed; and then down the common carotid where it were  secured with Rumel tourniquets.   The endarterectomy on the common carotid artery and the plaque was divided  proximally with Potts scissors.  The endarterectomy was carried down to the  bifurcation. The external carotid was endarterectomized with the eversion  technique and the internal carotid was endarterectomized in an open fashion.  The remaining atheromatous debris was removed from the endarterectomy plane.  A Finesse Hemashield Dacron patch was brought out onto the field and sewn as  a patch angioplasty with a running 6-0 Prolene suture.   Prior to completion of the anastomosis the shunt was removed and the usual  flushing maneuvers were undertaken. The anastomosis was then completed. The  external, followed by the common, followed by the internal carotid artery  occlusion clamp was removed. Excellent flow characteristics were noted with  handheld Doppler in the internal and  external carotid arteries. The patient was given 50 mg of protamine to  reverse heparin. The wound was irrigated with saline. Hemostasis, was  checked with electrocautery. The wound was closed with 3-0  Vicryl in the  subcutaneous and subcuticular tissue.  Steri-Strips were applied.      Larina Earthly, M.D.  Electronically Signed     TFE/MEDQ  D:  05/08/2005  T:  05/08/2005  Job:  [id number]   cc:   Loraine Leriche L. Vear Clock, M.D.  Fax: [phone]   Tawnya Crook. Erenest Rasher, M.D.  Fax: [phone]

## 2010-10-20 NOTE — Op Note (Signed)
Brittany Chung, Brittany Chung                         ACCOUNT NO.:  [id number]   MEDICAL RECORD NO.:  [id number]                   PATIENT TYPE:  AMB   LOCATION:  ENDO                                 FACILITY:  [facility]   PHYSICIAN:  Anselmo Rod, M.D.               DATE OF BIRTH:  05/08/1941   DATE OF PROCEDURE:  11/17/2003  DATE OF DISCHARGE:                                 OPERATIVE REPORT   PROCEDURE PERFORMED:  Screening colonoscopy.   ENDOSCOPIST:  Anselmo Rod, M.D.   INSTRUMENT USED:  Olympus video colonoscope.   INDICATION FOR PROCEDURE:  A 70 year old white female with a history of  mitral valve prolapse and personal history of rectal bleeding with change in  bowel habits, undergoing a screening colonoscopy.  Rule out colonic polyps,  masses, etc.   PREPROCEDURE PREPARATION:  Informed consent was procured from the patient.  The patient was fasted for eight hours prior to the procedure and prepped  with a bottle of magnesium citrate and a gallon of GoLYTELY the night prior  to the procedure.  She was also given a gram of Ancef for MVP prior to the  procedure.   PREPROCEDURE PHYSICAL:  VITAL SIGNS:  The patient had stable vital signs.  NECK:  Supple.  CHEST:  Clear to auscultation.  S1, S2 regular.  ABDOMEN:  Soft with normal bowel sounds.   DESCRIPTION OF PROCEDURE:  The patient was placed in the left lateral  decubitus position and sedated with 50 mg of Demerol and 5 mg of Versed in  slow incremental doses.  Once the patient was adequately sedate and  maintained on low-flow oxygen and continuous cardiac monitoring, the Olympus  video colonoscope was advanced from the rectum to the cecum.  The  appendiceal orifice and the ileocecal valve were clearly visualized and  photographed.  No masses, polyps, erosions, ulcerations, or diverticula were  seen.  A small internal hemorrhoid was appreciated on retroflexion in the  rectum.  A small anal tag was seen on anal inspection.   The patient  tolerated the procedure well without immediate complications.   IMPRESSION:  Normal colonoscopy up to the cecum except for a small internal  hemorrhoids.   RECOMMENDATIONS:  1. Repeat colonoscopy has been recommended in the next 10 years unless the     patient develops any abnormal     symptoms in the interim.  2. Continue a high-fiber diet with liberal fluid intake.  3. Outpatient follow-up in the next two weeks for further recommendations.                                               Anselmo Rod, M.D.    JNM/MEDQ  D:  11/17/2003  T:  11/17/2003  Job:  [id number]  cc:   Sibyl Parr. Darrick Penna, M.D.  Fax: [phone]

## 2010-10-20 NOTE — H&P (Signed)
[name], Brittany Chung             ACCOUNT NO.:  [id number]   MEDICAL RECORD NO.:  [id number]          PATIENT TYPE:  INP   LOCATION:  NA                           FACILITY:  [facility]   PHYSICIAN:  Larina Earthly, M.D.    DATE OF BIRTH:  July 26, 1940   DATE OF ADMISSION:  05/08/2005  DATE OF DISCHARGE:                                HISTORY & PHYSICAL   CHIEF COMPLAINT:  Left arm numbness.   HISTORY OF PRESENT ILLNESS:  This is a 70 year old Caucasian female with  past medical history of coronary artery disease, hypertension,  hyperlipidemia, diabetes mellitus.  About six to eight weeks ago the patient  went to the ER at [facility] due to left arm weakness.  There she  was evaluated for a stroke, results of the tests were negative.  The patient  has been experiencing left arm weakness for some time now.  The numbness  lasts about 20-30 seconds and can range from her left shoulder to her left  fingers.  Since the ER, the patient has only had 2-3 episodes of left arm  weakness.  She complains of daily frontal headaches that last up to an hour.  The patient takes ibuprofen with moderate relief.  The patient also  complains of dizziness when going up steps or standing up too fast.  The  patient also complains of recent memory loss which has started within the  past month.  The patient states that if she does not focus on the task at  hand, she will forget what she is doing.  The patient denies any nausea,  vomiting, seizures, tingling, weakness, dysphagia, visual changes, and  syncope.   On March 15, 2005, the patient had a carotid Duplex scan which showed  right carotid greater than 80% stenosis in the internal carotid artery and  external artery stenosis.  The left carotid artery showed 60-80% ICA  stenosis, also left ECA stenosis.  The patient has bilateral vertebral  artery flow which is antegrade.   PAST MEDICAL HISTORY:  1.  Extracranial cerebrovascular occlusive  disease.  2.  Coronary artery disease.  3.  Hypertension.  4.  Hyperlipidemia.  5.  Diabetes mellitus, type 2.  6.  Cataracts.  7.  Glaucoma.  8.  GERD.   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   ALLERGIES:  No known drug allergies.   MEDICATIONS:  1.  Fluticasone 50 mg, two sprays each nostril daily.  2.  Requip 0.5 mg daily.  3.  Protonix 40 mg daily.  4.  Effexor XR 150 mg daily.  5.  Metformin HDL 500 mg daily.  6.  Lipitor 80 mg daily.  7.  Ambien CR 6.25 mg daily.  8.  Lisinopril 5 mg daily.  9.  Glipizide 5 mg daily.  10. Wellbutrin XL 150 mg t.i.d.   REVIEW OF SYSTEMS:  See HPI for pertinent positives and negatives, otherwise  negative for renal disease.   SOCIAL HISTORY:  The patient is widowed and lives with her daughter.  The  patient denies tobacco and  alcohol use.   FAMILY HISTORY:  The patient has a negative family history for coronary  artery disease and strokes.   PHYSICAL EXAMINATION:  VITAL SIGNS:  Blood pressure 122/60, heart rate 76,  respirations 16.  GENERAL:  A 70 year old Caucasian female in no acute distress.  HEENT:  Normocephalic, atraumatic.  Pupils are equal, round, and reactive to  light accommodation.  Extraocular movements intact.  Oral mucosa pink and  moist.  Sclerae nonicteric.  NECK:  Supple.  No carotid bruits bilaterally heard with auscultation.  RESPIRATORY:  Labored breathing.  Clear to auscultation bilaterally.  CARDIAC:  Regular rate and rhythm.  No murmur, gallop, or rub.  ABDOMEN:  Soft, nontender, nondistended.  Normoactive bowel sounds x4.  GENITOURINARY:  Deferred.  RECTAL:  Deferred.  EXTREMITIES:  No edema or varicosities.  Lower extremities temperature warm.  Pulses +2 throughout.  NEUROLOGIC:  Nonfocal.  The patient is alert and oriented x3.  Gait is  steady.  Muscle strength is 5+ in the right and left upper and lower  extremities.  Deep tendon reflexes 2+.   ASSESSMENT:  1.   Right internal carotid artery stenosis.  2.  Coronary artery disease.  3.  Hypertension.  4.  Hyperlipidemia.  5.  Diabetes mellitus, type 2.  6.  Glaucoma.  7.  Cataracts.   PLAN:  1.  Admit to [facility] on May 08, 2005, under Dr. Bosie Helper      service.  2.  The patient will undergo a right carotid endarterectomy.  3.  The risks and benefits were explained to the patient in great detail.   Dr. Arbie Cookey evaluated the patient and agrees with the above.      Constance Holster, PA      Larina Earthly, M.D.  Electronically Signed    JMW/MEDQ  D:  05/04/2005  T:  05/04/2005  Job:  [id number]   cc:   patient's hospital chart   Larina Earthly, M.D.  [address]  [place]  [place] [address]

## 2010-10-24 ENCOUNTER — Encounter (HOSPITAL_COMMUNITY): Payer: Medicare Other | Admitting: Physician Assistant

## 2010-10-24 DIAGNOSIS — F988 Other specified behavioral and emotional disorders with onset usually occurring in childhood and adolescence: Secondary | ICD-10-CM

## 2010-10-25 ENCOUNTER — Encounter (HOSPITAL_COMMUNITY): Payer: MEDICARE | Admitting: Physician Assistant

## 2010-10-31 ENCOUNTER — Ambulatory Visit (INDEPENDENT_AMBULATORY_CARE_PROVIDER_SITE_OTHER): Payer: Medicare Other | Admitting: Family Medicine

## 2010-10-31 ENCOUNTER — Ambulatory Visit: Payer: Medicare Other | Admitting: Family Medicine

## 2010-10-31 ENCOUNTER — Encounter: Payer: Self-pay | Admitting: Family Medicine

## 2010-10-31 DIAGNOSIS — E119 Type 2 diabetes mellitus without complications: Secondary | ICD-10-CM

## 2010-10-31 DIAGNOSIS — N760 Acute vaginitis: Secondary | ICD-10-CM

## 2010-10-31 LAB — POCT GLYCOSYLATED HEMOGLOBIN (HGB A1C): Hemoglobin A1C: 7.2

## 2010-10-31 LAB — POCT WET PREP (WET MOUNT): Clue Cells Wet Prep HPF POC: NEGATIVE

## 2010-10-31 MED ORDER — TERCONAZOLE 0.4 % VA CREA: 1.0000 | TOPICAL_CREAM | Freq: Every day | VAGINAL | Status: DC

## 2010-10-31 NOTE — Patient Instructions (Signed)
It was nice to meet you today. I will call or send a letter with results of your recent lab work. Please start using Terazol cream as directed for 7 days. This is a different anti-fungal cream that treats a different type of yeast infection. Please schedule a follow up appointment with me in 1 week to see if your symptoms are improving. Stop using Skin So Soft.  You may continue to use aloe, but you need to use moisturizing lotion as well. I recommend Aveeno lotion or Aquaphor. If symptoms become worse, you develop fevers, chills, or signs of infection, please call MD. Thanks, Dr. Tye Savoy

## 2010-11-01 ENCOUNTER — Telehealth: Payer: Self-pay | Admitting: Family Medicine

## 2010-11-01 ENCOUNTER — Encounter: Payer: Self-pay | Admitting: Family Medicine

## 2010-11-01 NOTE — Telephone Encounter (Signed)
Called back and  the person answering call stated patient will have to call me back.

## 2010-11-01 NOTE — Telephone Encounter (Addendum)
Spoke with patient and she states she has used cream once but she feels that it has made the problem worse. She does not think a cream for 7 days is going to help because this has been an on going problem since Christmas.   Paged Dr. Tye Savoy and she advises for patient to stop using the tezarol cream and  Return to see her tomorrow . Appointment scheduled for 3:00 PM tomorrow.

## 2010-11-01 NOTE — Assessment & Plan Note (Signed)
Patient may have recurrent yeast infections secondary to uncontrolled blood sugars.  Will get a POCT capillary blood glucose and an A1c today.  Patient will likely need follow up DM management with PCP after acute issue of vulvovaginitis has resolved.  May need to adjust DM medications sooner if A1c is elevated.

## 2010-11-01 NOTE — Assessment & Plan Note (Signed)
Possible etiologies include recurrent Candidiasis 2/2 uncontrolled blood sugars, chronic moisture from vaginal discharge and vaginal moisturizers, or poor response to Diflucan.  Discussed with Dr. Jennette Kettle today.  Previous wet prep may not detect Candida glabrada and certain yeast do not respond to Diflucan.  Will repeat wet prep today.  Will give a Rx for Terazol to be used x7 days.  Patient is to return to clinic in 1 week to make sure symptoms are improving.  Will also order A1c and CBG today to assess for uncontrolled T2 DM that could be contributing to recurrent infections.

## 2010-11-01 NOTE — Progress Notes (Signed)
  Subjective:    Patient ID: Brittany Chung, female    DOB: 12-Apr-1941, 70 y.o.   MRN: [id number]  HPI This is 70 yo female who returns for clinic for yeast infection.  Patient was seen twice since 4/16 for similar symptoms.  Both times she was given a Rx for Diflucan.  Patient states inflammation has been around since December 2011.  Patient says the medicine helps for a week and then the symptoms return.  Per previous note"  Likely yeast infection secondary to Clindamycin use (decreasing natural bacterial flora) and exacerbated by self-treatment with steroid cream. Will follow-up on wet prep but based on clear presentation of yeast (inflammed with satellite lesions), will treat for yeast infection now."  First wet prep was negative for yeast.  Patient complains of dysuria, vaginal discharge (feels hot and burns), dry, cracked rash.   Review of Systems  Constitutional: Negative for fever, chills and diaphoresis.  Genitourinary: Positive for dysuria, frequency, vaginal discharge, difficulty urinating and vaginal pain. Negative for hematuria, flank pain and vaginal bleeding.  Musculoskeletal: Negative for back pain.       Objective:   Physical Exam  Constitutional: She appears well-developed and well-nourished. No distress.  Abdominal: Soft. Bowel sounds are normal. She exhibits no distension and no mass. There is no tenderness.  Genitourinary: There is rash and tenderness on the right labia. There is rash and tenderness on the left labia. Cervix exhibits discharge. Cervix exhibits no friability. Right adnexum displays no mass. Left adnexum displays no mass. There is erythema and tenderness around the vagina. No bleeding around the vagina. Vaginal discharge found.       Skin around labia is shiny, erythematous, hyperpigmented cracking and scaling that is worse around gluteal fold. Vagina atrophic with tenderness to palpation.     Musculoskeletal: She exhibits no edema.  Neurological: She is  alert.  Skin: Skin is dry. Rash noted.       See GU exam          Assessment & Plan:

## 2010-11-01 NOTE — Telephone Encounter (Signed)
Pt was seen yesterday for ?yeast infection and was given cream that pat is having rx to.  Causing burning and peeling of skin.  Need to speak with nurse regarding issue

## 2010-11-02 ENCOUNTER — Ambulatory Visit (INDEPENDENT_AMBULATORY_CARE_PROVIDER_SITE_OTHER): Payer: Medicare Other | Admitting: Family Medicine

## 2010-11-02 ENCOUNTER — Encounter: Payer: Self-pay | Admitting: Family Medicine

## 2010-11-02 DIAGNOSIS — N760 Acute vaginitis: Secondary | ICD-10-CM

## 2010-11-02 DIAGNOSIS — R21 Rash and other nonspecific skin eruption: Secondary | ICD-10-CM

## 2010-11-02 MED ORDER — LIDOCAINE HCL 2 % EX GEL: Freq: Every day | CUTANEOUS | Status: DC

## 2010-11-02 MED ORDER — HYDROCORTISONE 2.5 % EX CREA: TOPICAL_CREAM | Freq: Two times a day (BID) | CUTANEOUS | Status: DC

## 2010-11-02 NOTE — Progress Notes (Signed)
  Subjective:    Patient ID: Brittany Chung, female    DOB: 1941-05-23, 70 y.o.   MRN: [id number]  HPI  This is a 70 year old female who returns to clinic for recurrent vaginal rash that extends to buttocks.  Patient's rash started in December 2011.  Was seen twice in March and diagnosed with yeast infection, treated with Diflucan twice.  Patient says Diflucan helps at first, but vaginal rash returns 1-2 weeks after treatment.  Was seen 2 days ago for dysuria, vaginal discharge, and red, scaly genital rash.  Wet prep was negative for yeast.  Was given a Rx for Terazol to treat C. Glabrata.  Patient applied it once Tuesday night and she said it burned and thinks it made the rash worse.  Still complains of dysuria, itchiness, discharge, and erythema.  A generalized rash has appeared on bilateral upper and lower extremities 1 week ago.  It appeared after she bathed with bath salts and Skin So Soft.  Rash is itchy, not painful.  She has not used any OTC ointments yet.  Denies exposure to scabies or any other insects, shrubs, or woody areas, or children with rashes.  Review of Systems  Constitutional: Negative for chills and diaphoresis.  Genitourinary: Positive for dysuria, frequency, vaginal discharge, difficulty urinating and vaginal pain. Negative for hematuria, flank pain and vaginal bleeding.       Objective:   Physical Exam  Constitutional: She appears well-developed and well-nourished. No distress.  Genitourinary: There is rash and tenderness on the right labia. There is rash and tenderness on the left labia. There is erythema around the vagina. No bleeding around the vagina.       Skin around labia is dry, erythematous.   Skin around rectum is dry, cracking, and scaly.  However, it appears to be improving.     Skin: Skin is dry. Rash noted.       Bilateral upper and lower extremities exhibit small, round, papular lesions that are red and dry with some excoriation.             Assessment & Plan:

## 2010-11-02 NOTE — Assessment & Plan Note (Signed)
Likely secondary to Candida glabrata.  Because vaginal rash appears to be improving, will continue treatment with Terazol x 7 days. Will give Rx for Lidocaine ointment to be applied prior to Terazol to prevent burning or pain. Patient to return to clinic in 7 days. If rash is not improving, will consider a 3-4 mm punch biopsy. Patient understood and agreed with plan.

## 2010-11-02 NOTE — Patient Instructions (Signed)
It was nice to see you again. Apply lidocaine jelly to affected area prior to using Terazol.  This will help numb the skin so it does not burn. Please restart using Terazol cream as directed for 7 days. This is a different anti-fungal cream that treats a different type of yeast infection. For the rash on your arms and legs, please apply hydrocortisone cream to affected area twice a day for 14 days. If symptoms become worse, you develop fevers, chills, or signs of infection, please call MD. Thanks, Dr. Tye Savoy

## 2010-11-02 NOTE — Assessment & Plan Note (Signed)
Likely secondary to allergic reaction to Skin so Soft or bath salts. Patient to discontinue use of both products. Advised to use Aveeno lotion to prevent dry skin and itching. Will give Rx for Hydrocortisone 2.5% to be applied BID on extremities only. Will follow up in 7 days to see if rash is improving.

## 2010-11-10 ENCOUNTER — Encounter: Payer: Self-pay | Admitting: Family Medicine

## 2010-11-10 ENCOUNTER — Ambulatory Visit (INDEPENDENT_AMBULATORY_CARE_PROVIDER_SITE_OTHER): Payer: Medicare Other | Admitting: Family Medicine

## 2010-11-10 DIAGNOSIS — N760 Acute vaginitis: Secondary | ICD-10-CM

## 2010-11-10 DIAGNOSIS — R21 Rash and other nonspecific skin eruption: Secondary | ICD-10-CM

## 2010-11-10 MED ORDER — TERCONAZOLE 0.8 % VA CREA: 1.0000 | TOPICAL_CREAM | Freq: Every day | VAGINAL | Status: DC

## 2010-11-10 MED ORDER — HYDROCORTISONE 2.5 % EX CREA: TOPICAL_CREAM | Freq: Two times a day (BID) | CUTANEOUS | Status: AC

## 2010-11-10 MED ORDER — ROPINIROLE HCL 1 MG PO TABS: 1.0000 mg | ORAL_TABLET | Freq: Every day | ORAL | Status: DC

## 2010-11-10 NOTE — Patient Instructions (Signed)
It was nice to see you again.  I'm glad you are doing better. Please restart using Terazol cream as directed for 3 days. For the rash on your arms and legs, please apply hydrocortisone cream to affected area twice a day for 14 days as needed. Continue to moisturize with lotion multiple times per day as needed for dry skin. I recommend using Dove soap or other mild soap that won't irritate your skin. If symptoms become worse, you develop fevers, chills, or signs of infection, please call MD. Lastly, please schedule an appointment with Dr. Madolyn Frieze to discuss hypertension and diabetes. Have a great weekend, Dr. Tye Savoy

## 2010-11-11 NOTE — Progress Notes (Signed)
  Subjective:    Patient ID: Brittany Chung, female    DOB: 12-16-1940, 70 y.o.   MRN: [id number]  HPI Patient returns to clinic for follow-up vaginal rash.  Per patient, rash on vagina and around rectum has improved significantly.  However, she says her symptoms tend to return a week after treatment.  Patient is satisfied with Terazol cream and Lidocaine jelly regimen.  Denies any dysuria, vaginal discharge, or bleeding.  Still complains of dry skin and itchiness.  No fever, chills, NS, abdominal pain, N/V.   Generalized rash on extremities is also improving, but not completely resolved.  Patient uses lotion once a day.  Has run out of steroid topical cream.  Wants refill.   Review of Systems per HPI    Objective:   Physical Exam  Constitutional: She appears well-developed and well-nourished. No distress.  Genitourinary:       Rash significantly improved compared to last visit. Mild erythema and dry skin on buttocks around rectum. Rash on labial folds has almost completely resolved.    Neurological: She is alert.  Skin:       Rash on arms and legs improving.  Erythematous, dry, and scaly.  No active bleeding, plaques, or excoriations.          Assessment & Plan:

## 2010-11-11 NOTE — Assessment & Plan Note (Signed)
Will give refill for hydrocortisone cream.  To be applied BID prn for itchiness/rash.  Encouraged applying Aveeno lotion to skin twice daily as needed for dry skin.

## 2010-11-11 NOTE — Assessment & Plan Note (Signed)
Rash responded well to Terazol cream.  Patient was able to tolerate application with Lidocaine jelly.  Because vaginal rash has improved significantly, will continue treatment with Terazol x 3 days to ensure rash does not recur. Patient to schedule appointment with PCP to discuss HTN and DM.

## 2010-11-12 ENCOUNTER — Other Ambulatory Visit: Payer: Self-pay | Admitting: Family Medicine

## 2010-11-13 NOTE — Telephone Encounter (Signed)
Refill request

## 2010-11-28 ENCOUNTER — Ambulatory Visit (INDEPENDENT_AMBULATORY_CARE_PROVIDER_SITE_OTHER): Payer: Medicare Other | Admitting: Sports Medicine

## 2010-11-28 ENCOUNTER — Encounter: Payer: Self-pay | Admitting: Sports Medicine

## 2010-11-28 DIAGNOSIS — L03317 Cellulitis of buttock: Secondary | ICD-10-CM

## 2010-11-28 DIAGNOSIS — L0231 Cutaneous abscess of buttock: Secondary | ICD-10-CM

## 2010-11-28 DIAGNOSIS — B3731 Acute candidiasis of vulva and vagina: Secondary | ICD-10-CM | POA: Insufficient documentation

## 2010-11-28 DIAGNOSIS — B373 Candidiasis of vulva and vagina: Secondary | ICD-10-CM | POA: Insufficient documentation

## 2010-11-28 DIAGNOSIS — B372 Candidiasis of skin and nail: Secondary | ICD-10-CM

## 2010-11-28 MED ORDER — TERBINAFINE HCL 1 % EX CREA: TOPICAL_CREAM | Freq: Two times a day (BID) | CUTANEOUS | Status: DC

## 2010-11-28 MED ORDER — DOXYCYCLINE HYCLATE 100 MG PO TABS: 100.0000 mg | ORAL_TABLET | Freq: Two times a day (BID) | ORAL | Status: AC

## 2010-11-28 NOTE — Assessment & Plan Note (Signed)
Does not feel ready for I&D. Doxy for 7 days. RTC if no better and would need to I&D this.

## 2010-11-28 NOTE — Assessment & Plan Note (Signed)
In a pt with diabetes. Terbinafine cream BID until rash gone.

## 2010-11-28 NOTE — Progress Notes (Signed)
  Subjective:    Patient ID: Brittany Chung, female    DOB: 02-Oct-1940, 70 y.o.   MRN: [id number]  HPI Knot on buttock:  Present lateral to cleft, tender, present for several months.  Worse over the past few days.  No drainage.  Generalized itching and rash in intertriginous region.  No fevers/chills/N/V/D/C.   Review of Systems    See HPI Objective:   Physical Exam  Constitutional: She appears well-developed and well-nourished. No distress.  Genitourinary:     Tender nodule to the right of the gluteal cleft.  No tracts seen to suggest pilonidal disease.     Assessment & Plan:

## 2010-11-28 NOTE — Patient Instructions (Addendum)
Great to see you. Apply the cream 2x a day until the rash is gone for 2d. Doxycycline 2x a day for 7 days to treat the buttock abscess. Come back to see [place] if no better after the 7 days.   Ihor Austin. Benjamin Stain, M.D. [facility] [address] [place], [place] [address] [phone] Abscess/Boil (Furuncle) An abscess (boil or furuncle) is an infected area that contains a collection of pus.   SYMPTOMS Signs and symptoms of an abscess include pain, tenderness, redness, or hardness. You may feel a moveable soft area under your skin. An abscess can occur anywhere in the body.   TREATMENT An incision (cut by the caregiver) may have been made over your abscess so the pus could be drained out. Gauze may have been packed into the space or a drain may have been looped thru the abscess cavity (pocket). This provides a drain that will allow the cavity to heal from the inside outwards. The abscess may be painful for a few days, but should feel much better if it was drained. Your abscess, if seen early, may not have localized and may not have been drained. If not, another appointment may be required if it does not get better on its own or with medications. HOME CARE INSTRUCTIONS  Only take over-the-counter or prescription medicines for pain, discomfort, or fever as directed by your caregiver.   Keep the skin and clothes clean around your abscess.   If the abscess was drained, you will need to use gauze dressing ("4x4") to collect any draining pus. These dressing typically will need to be changed 3 or more times during the day.   The infection may spread by skin contact with others. Avoid skin contact as much as possible.   Good hygiene is very important including regular hand washing, cover any draining skin lesions, and don't share personal care items.   If you participate in sports do not share athletic equipment, towels, whirlpools, or personal care items. Shower after every  practice or tournament.   If a draining area cannot be adequately covered:   Do not participate in sports   Children should not participate in day care until the wound has healed or drainage stops.   If your caregiver has given you a follow-up appointment, it is very important to keep that appointment. Not keeping the appointment could result in a much worse infection, chronic or permanent injury, pain, and disability. If there is any problem keeping the appointment, you must call back to this facility for assistance.  SEEK MEDICAL CARE IF:  You develop increased pain, swelling, redness, drainage, or bleeding in the wound site.   You develop signs of generalized infection including muscle aches, chills, fever, or a general ill feeling.   You or your child has an oral temperature above 100.4.   Your baby is older than 3 months with a rectal temperature of 100.5 F (38.1 C) or higher for more than 1 day.  See your caregiver as directed for a recheck or sooner if you develop any of the symptoms described above. Take antibiotics (medicine that kills germs) as directed if they were prescribed. MAKE SURE YOU:    Understand these instructions.   Will watch your condition.   Will get help right away if you are not doing well or get worse.  Document Released: 02/28/2005 Document Re-Released: 11/08/2009 [facility] Patient Information 2011 [place], [place]. Intertrigo Intertrigo is a skin condition that occurs in between folds  of skin in places on the body that rub together a lot and do not get much ventilation. It is caused by heat, moisture, friction, sweat retention, and lack of air circulation, which produces red, irritated patches and, sometimes, scaling or drainage. People who have diabetes, who are obese, or who have treatment with antibiotics are at increased risk for intertrigo. The most common sites for intertrigo to occur include:  The groin.   The breasts.   The armpits.   Folds  of abdominal skin.   Webbed spaces between the fingers or toes.  Intertrigo may be aggravated by:  Sweat.   Feces.   Yeast or bacteria that are present near skin folds.   Urine.   Vaginal discharge.  HOME CARE  The following steps can be taken to reduce friction and keep the affected area cool and dry:   Expose skin folds to the air.   Keep deep skin folds separated with cotton or linen cloth. Avoid tight fitting clothing that could cause chafing.   Wear open-toed shoes or sandals to help reduce moisture between the toes.   Apply absorbent powders to affected areas, as directed by your caregiver.   Apply over-the-counter barrier pastes, such as zinc oxide, as directed by your caregiver.   If you develop a fungal infection in the affected area, your caregiver may have you use antifungal creams.  SEEK MEDICAL CARE IF:  The rash is not improving after one week of treatment.   The rash is getting worse (more red, more swollen, more painful, or spreading).   Associated with fever or chills.  MAKE SURE YOU:  Understand these instructions.   Will watch your condition.   Will get help right away if you are not doing well or get worse.  Document Released: 05/21/2005 Document Re-Released: 11/08/2009 [facility] Patient Information 2011 [place], [place].

## 2010-12-02 ENCOUNTER — Other Ambulatory Visit: Payer: Self-pay | Admitting: Family Medicine

## 2010-12-03 NOTE — Telephone Encounter (Signed)
Refill request

## 2010-12-04 ENCOUNTER — Other Ambulatory Visit: Payer: Self-pay | Admitting: Family Medicine

## 2010-12-04 NOTE — Telephone Encounter (Signed)
Refill request

## 2010-12-05 ENCOUNTER — Telehealth: Payer: Self-pay | Admitting: Family Medicine

## 2010-12-05 NOTE — Telephone Encounter (Signed)
Pt states that she has refills at the pharmacy - (looks like the refill has been sent electronically) and will be out today.  metformin   ALSO - Finished meds for her boils and she still has knots still there

## 2010-12-05 NOTE — Telephone Encounter (Signed)
Refill request for patient of Dr. Madolyn Frieze

## 2010-12-05 NOTE — Telephone Encounter (Signed)
Patient notified that rx has been sent for metformin. States she has refills on seroquel and actually does not need this.

## 2010-12-07 ENCOUNTER — Encounter: Payer: Self-pay | Admitting: Family Medicine

## 2010-12-07 ENCOUNTER — Ambulatory Visit (INDEPENDENT_AMBULATORY_CARE_PROVIDER_SITE_OTHER): Payer: Medicare Other | Admitting: Family Medicine

## 2010-12-07 DIAGNOSIS — E119 Type 2 diabetes mellitus without complications: Secondary | ICD-10-CM

## 2010-12-07 DIAGNOSIS — L0231 Cutaneous abscess of buttock: Secondary | ICD-10-CM

## 2010-12-07 MED ORDER — QUETIAPINE FUMARATE ER 50 MG PO TB24: 100.0000 mg | ORAL_TABLET | Freq: Every day | ORAL | Status: DC

## 2010-12-07 MED ORDER — METFORMIN HCL 500 MG PO TABS: 500.0000 mg | ORAL_TABLET | Freq: Every day | ORAL | Status: DC

## 2010-12-07 NOTE — Patient Instructions (Signed)
It was great to see you today! Your back side is looking much better! Make an appointment to see Dr. Madolyn Frieze for a physical sometime in the next 3-4 weeks. I am sending in refills on your metformin and seroquel.

## 2010-12-11 ENCOUNTER — Other Ambulatory Visit: Payer: Self-pay | Admitting: Family Medicine

## 2010-12-11 NOTE — Telephone Encounter (Signed)
Refill request

## 2010-12-12 ENCOUNTER — Encounter: Payer: Self-pay | Admitting: Family Medicine

## 2010-12-12 NOTE — Assessment & Plan Note (Signed)
the patient 70 year old female who would appear to have a resolving case of anal cellulitis. As the patient is not currently showing signs of symptoms of systemic infection, is relatively asymptomatic, and states that she is improving, no additional medication will be prescribed at this time. The patient was instructed on red flags that should cause her to return, and told that if these did not appear no follow-up for this issue was required. The patient was also instructed to schedule an appointment for a complete physical exam with her primary care provider sometime in the next 3 to 4 weeks. As a courtesy to the primary, I have provided the patient with refills on her metformin and Seroquel.

## 2010-12-12 NOTE — Progress Notes (Signed)
Subjective: the patient is a 69 year old female who presents today for a read check of the lesions on her buttocks. The patient reports that since her last visit, she has faithfully taken the antibiotics as prescribed, and has noticed significant decrease in her symptoms. She reports that the areas on her buttocks which, before, was concerning for possible abscess formation, are now barely palpable. She had also been having some problems with an anal yeast infection, which has also gotten better. Currently, she does not report any fevers, chills, pain with defecation or with urination, any vaginal discharge, or any significant itching in the genital area.  Objective: vital signs reviewed and are unremarkable. General: no acute distress, alert and cooperative with exam. Genital: the patient's right buttock and labial area are mildly erythematous with some skin desquamation. The area is not warm to the touch, and is not tender. The patient reports that it is much improved since her last visit. There is a small area in the left buttock just lateral to the anus which has several small knots palpable and the deep subcutaneous tissue. There is no fluctuates or induration. No drainage is notable anywhere.

## 2010-12-13 ENCOUNTER — Telehealth: Payer: Self-pay | Admitting: Family Medicine

## 2010-12-13 NOTE — Telephone Encounter (Signed)
Needs refills for Enalapril, Synvastatin, says that [facility] has tried numerous times to get the refills and have not been able to.  She is now  Out of both medications.  Please call her about the status of this.

## 2011-01-01 ENCOUNTER — Encounter: Payer: Medicare Other | Admitting: Family Medicine

## 2011-01-02 NOTE — Progress Notes (Signed)
This encounter was created in error - please disregard.

## 2011-01-08 ENCOUNTER — Other Ambulatory Visit: Payer: Self-pay | Admitting: Family Medicine

## 2011-01-08 MED ORDER — METFORMIN HCL 500 MG PO TABS: 500.0000 mg | ORAL_TABLET | Freq: Every day | ORAL | Status: DC

## 2011-01-09 ENCOUNTER — Other Ambulatory Visit: Payer: Self-pay | Admitting: Family Medicine

## 2011-01-09 MED ORDER — METFORMIN HCL 500 MG PO TABS: 500.0000 mg | ORAL_TABLET | Freq: Every day | ORAL | Status: DC

## 2011-01-25 ENCOUNTER — Encounter (HOSPITAL_COMMUNITY): Payer: Medicare Other | Admitting: Physician Assistant

## 2011-02-05 ENCOUNTER — Other Ambulatory Visit: Payer: Self-pay | Admitting: Family Medicine

## 2011-02-05 NOTE — Telephone Encounter (Signed)
Refill request

## 2011-02-22 ENCOUNTER — Ambulatory Visit (INDEPENDENT_AMBULATORY_CARE_PROVIDER_SITE_OTHER): Payer: Medicare Other | Admitting: Family Medicine

## 2011-02-22 ENCOUNTER — Encounter: Payer: Self-pay | Admitting: Family Medicine

## 2011-02-22 VITALS — BP 97/61 | HR 80 | Temp 98.1°F | Wt 155.0 lb

## 2011-02-22 DIAGNOSIS — E119 Type 2 diabetes mellitus without complications: Secondary | ICD-10-CM

## 2011-02-22 DIAGNOSIS — R21 Rash and other nonspecific skin eruption: Secondary | ICD-10-CM | POA: Insufficient documentation

## 2011-02-22 DIAGNOSIS — B372 Candidiasis of skin and nail: Secondary | ICD-10-CM

## 2011-02-22 MED ORDER — FREESTYLE SYSTEM KIT: 1.0000 | PACK | Status: DC | PRN

## 2011-02-22 MED ORDER — METFORMIN HCL 500 MG PO TABS: 500.0000 mg | ORAL_TABLET | Freq: Two times a day (BID) | ORAL | Status: DC

## 2011-02-22 MED ORDER — HYDROXYZINE PAMOATE 25 MG PO CAPS: 25.0000 mg | ORAL_CAPSULE | Freq: Four times a day (QID) | ORAL | Status: AC | PRN

## 2011-02-22 MED ORDER — FLUCONAZOLE 150 MG PO TABS: ORAL_TABLET | ORAL | Status: DC

## 2011-02-22 MED ORDER — ENALAPRIL MALEATE 5 MG PO TABS: 2.5000 mg | ORAL_TABLET | Freq: Every day | ORAL | Status: DC

## 2011-02-22 NOTE — Progress Notes (Signed)
  Subjective:    Patient ID: Brittany Chung, female    DOB: April 29, 1941, 70 y.o.   MRN: [id number]  HPI This is a 70 YO patient with a history of T2DM and HLD, bipolar disorder/ADHD/IBS followed by psychiatrist Dr. 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   1. DM Does not check sugars at home. Denies chest pain, changes in vision, paresthesias.  2. Yeast vulvovaginitis Symptoms improved with serial diflucan and cream (seen at clinic for this). But feels symptoms (irritation, itchiness) is returning over past few days. ROS: denies dysuria/urinary frequency or urgency  3. Rash Started on neck few days ago. Now spread throughout torso. Seen at [facility] for this. Given cream, which has been helping.  Unsure of cause of this rash. Patient reports being very sensitive in general. Denies taking any new medications, wearing new clothing/new shampoos/soaps/perfumes. Denies hiking or doing anything out of the ordinary outdoors.  Review of Systems Per HPI and following: Denies fevers, chills. Denies nausea/vomiting/constipation. Denies vaginal discharge.    Objective:   Physical Exam Gen: NAD Psych: engaged, appropriate to questions, anxious-appearing, not depressed-appearing Skin: scattered 1-66mm maculopapular, blanchable rash on torso, arms, buttocks, up to upper thighs. None on neck or lower extremities. No open lesions or ulcers HEENT: TM clear, nares normal, oropharynx normal, no thyromegaly or nodules, no LAD CV: RRR, no m/r/g Pulm: CTAB, no w/r/r Abd: obese, NABS, soft, NT GU: significant erythema around anus and vulva, similar to previous presentations, mild TTP, erythema present in skin folds with some satellite lesions Ext: no cyanosis, edema    Assessment & Plan:

## 2011-02-22 NOTE — Patient Instructions (Addendum)
For your yeast infection, take 1 diflucan tablet every 3 days for a total of 3 tablets. To prevent this from happening, continue the diflucan 1 tablet every week for the next 6 months.  Cut your blood pressure medicine (vasotec/enalapril) in half.  For your rash, continue to use the cream. Take the Vistaril for the itchiness.  For your diabetes, increase your metformin to 1 tablet twice a day. Exercise regularly.   Follow-up on your blood pressure and yeast infection in 1 month or sooner if needed.    Diabetes Meal Planning Guide The diabetes meal planning guide is a tool to help you plan your meals and snacks. It is important for people with diabetes to manage their blood sugar levels. Choosing the right foods and the right amounts throughout your day will help control your blood sugar. Eating right can even help you improve your blood pressure and reach or maintain a healthy weight. CARBOHYDRATE COUNTING MADE EASY When you eat carbohydrates, they turn to sugar (glucose). This raises your blood sugar level. Counting carbohydrates can help you control this level so you feel better. When you plan your meals by counting carbohydrates, you can have more flexibility in what you eat and balance your medicine with your food intake. Carbohydrate counting simply means adding up the total amount of carbohydrate grams (g) in your meals or snacks. Try to eat about the same amount at each meal. Foods with carbohydrates are listed below. Each portion below is 1 carbohydrate serving or 15 grams of carbohydrates. Ask your dietician how many grams of carbohydrates you should eat at each meal or snack. Grains and Starches 1 slice bread 1/2 English muffin or hotdog/hamburger bun 3/4 cup cold cereal (unsweetened) 1/3 cup cooked pasta or rice 1/2 cup starchy vegetables (corn, potatoes, peas, beans, winter squash) 1 tortilla (6 inches) 1/4 bagel 1 waffle or pancake (size of a CD) 1/2 cup cooked cereal 4 to 6  small crackers *Whole grain is recommended Fruit 1 cup fresh unsweetened berries, melon, papaya, pineapple 1 small fresh fruit 1/2 banana or mango 1/2 cup fruit juice (4 ounces unsweetened) 1/2 cup canned fruit in natural juice or water 2 tablespoons dried fruit 12 to 15 grapes or cherries Milk and Yogurt 1 cup fat-free or 1% milk 1 cup soy milk 6 ounces light yogurt with sugar-free sweetener 6 ounces low-fat soy yogurt 6 ounces plain yogurt Vegetables 1 cup raw or 1/2 cup cooked is counted as 0 carbohydrates or a "free" food. If you eat 3 or more servings at one meal, count them as 1 carbohydrate serving. Other Carbohydrates 3/4 ounces chips or pretzels 1/2 cup ice cream or frozen yogurt 1/4 cup sherbet or sorbet 2 inch square cake, no frosting 1 tablespoon honey, sugar, jam, jelly, or syrup 2 small cookies 3 squares of graham crackers 3 cups popcorn 6 crackers 1 cup broth-based soup Count 1 cup casserole or other mixed foods as 2 carbohydrate servings. Foods with less than 20 calories in a serving may be counted as 0 carbohydrates or a "free" food. You may want to purchase a book or computer software that lists the carbohydrate gram counts of different foods. In addition, the nutrition facts panel on the labels of the foods you eat are a good source of this information. The label will tell you how big the serving size is and the total number of carbohydrate grams you will be eating per serving. Divide this number by 15 to obtain the number of carbohydrate  servings in a portion. Remember: 1 carbohydrate serving equals 15 grams of carbohydrate. SERVING SIZES Measuring foods and serving sizes helps you make sure you are getting the right amount of food. The list below tells how big or small some common serving sizes are.  1 ounce (oz) of cheese.................................4 stacked dice.   2 to 3 oz cooked meat.................................Marland KitchenDeck of cards.   1 teaspoon  (tsp)...........................................Marland KitchenTip of little finger.   1 tablespoon (tbs).......................................Marland KitchenMarland KitchenThumb.   2 tbs............................................................Marland KitchenGolf ball.    cup..........................................................Marland KitchenHalf of a fist.   1 cup...........................................................Marland KitchenA fist.  SAMPLE DIABETES MEAL PLAN Below is a sample meal plan that includes foods from the grain and starches, dairy, vegetable, fruit, and meat groups. A dietician can individualize a meal plan to fit your calorie needs and tell you the number of servings needed from each food group. However, controlling the total amount of carbohydrates in your meal or snack is more important than making sure you include all of the food groups at every meal. You may interchange carbohydrate containing foods (dairy, starches, and fruits). The meal plan below is an example of a 2000 calorie diet using carbohydrate counting. This meal plan has 17 carbohydrate servings (carb choices). Breakfast  1 cup oatmeal (2 carb choices)  3/4 cup light yogurt (1 carb choice) 1 cup blueberries (1 carb choice) 1/4 cup almonds   Snack  1 large apple (2 carb choices)  1 low-fat string cheese stick   Lunch  Chicken breast salad:   1 cup spinach     1/4 cup chopped tomatoes     2 oz chicken breast, sliced     2 tbs low-fat [place] dressing  12 whole-wheat crackers (2 carb choices) 12 to 15 grapes (1 carb choice) 1 cup low-fat milk (1 carb choice)   Snack  1 cup carrots  1/2 cup hummus (1 carb choice)   Dinner  3 oz broiled salmon  1 cup brown rice (3 carb choices)   Snack  1 1/2 cups steamed broccoli (1 carb choice) drizzled with 1 tsp olive oil and lemon juice  1 cup light pudding (2 carb choices)   DIABETES MEAL PLANNING WORKSHEET Your dietician can use this worksheet to help you decide how many servings of foods and what types of foods are right for  you.   Breakfast Food Group and Servings Carb Choices Grain/Starches _______________________________________ Dairy ______________________________________________ Vegetable _______________________________________ Fruit _______________________________________________ Meat _______________________________________________ Fat _____________________________________________ Lunch Food Group and Servings Carb Choices Grain/Starches ________________________________________ Dairy _______________________________________________ Fruit ________________________________________________ Meat ________________________________________________ Fat _____________________________________________ Dinner Food Group and Servings Carb Choices Grain/Starches ________________________________________ Dairy _______________________________________________ Fruit ________________________________________________ Meat ________________________________________________ Fat _____________________________________________ Snacks Food Group and Servings Carb Choices Grain/Starches ________________________________________ Dairy _______________________________________________ Vegetable ________________________________________ Fruit ________________________________________________ Meat ________________________________________________ Fat _____________________________________________ Daily Totals Starches _________________________  Vegetable __________________________ Fruit ______________________________ Dairy ______________________________ Meat ______________________________ Fat ________________________________   Document Released: 02/15/2005 Document Re-Released: 11/08/2009 [employer] Patient Information 2011 [place], [place].Diabetes and Exercise Regular exercise is important and can help:    Control blood glucose (sugar).   Decrease blood pressure.   Control blood lipids (cholesterol and triglycerides).   Improve  overall health.  BENEFITS FROM EXERCISE:  Improved fitness.   Improved flexibility.   Improved endurance.   Increased bone density.   Weight control.   Increased muscle strength.   Decreased body fat.   Improvement of the body's use of a hormone called insulin.   Increased insulin sensitivity.   Reduction of insulin needs.   Helps you feel better.   Reduces stress  and tension.  People with diabetes who add exercise to their lifestyle gain additional benefits.    Weight loss.   Reduces appetite.   Improves body's use of blood glucose (sugar).   Decreases risk factors for heart disease:   Lowering of cholesterol and triglycerides.   Raising the level of good cholesterol (high-density lipoproteins [HDL]).   Lowering blood sugar.   Decreases blood pressure.  TYPE 1 DIABETES AND EXERCISE  Exercise will usually lower your blood glucose.   If blood glucose is greater than 240 mg/dl, check urine ketones. If ketones are present, do not exercise.   Location of the insulin injection sites may need to be adjusted with exercise. Avoid injecting insulin into areas of the body that will be exercised. For example, avoid injecting insulin into:   The arms when playing tennis.   The legs when jogging. For more information, discuss this with your caregiver.   Keep a record of:   Food intake.   Type and amount of exercise.   Expected peak times of insulin action.   Blood glucose (sugar) levels.  Do this before, during and after exercise. Review your records with your caregiver(s). This will help you to develop guidelines for adjusting food intake and/or insulin amounts.   TYPE 2 DIABETES AND EXERCISE  Regular physical activity can help control blood glucose.   Exercise is important because it may:   Increase the body's sensitivity to insulin.   Improve blood glucose control.   Exercise reduces the risk of heart disease. It decreases serum cholesterol and  triglycerides. It also lowers blood pressure.   Those who take insulin or oral hypoglycemic agents should watch for signs of hypoglycemia. These signs include dizziness, shaking, sweating, chills and confusion.   Body water is lost during exercise. It must be replaced. This will help to avoid loss of body fluids (dehydration) and/or heat stroke.  Be sure to talk to your caregiver before starting an exercise program to make sure it is safe for you. Remember, any activity is better than none.   Document Released: 08/11/2003 Document Re-Released: 03/18/2009 [facility] Patient Information 2011 [place], [place].

## 2011-02-22 NOTE — Assessment & Plan Note (Signed)
HgbA1c worsen, from 7.2 to now 7.7. Will increase metformin. Discussed dietary changes and increased exercise. May be cause of frequent candida vulvovaginitis.  Patient given sugar log and new Rx sent in for glucometer/strips/lancets. Follow-up in 1 month.

## 2011-02-22 NOTE — Assessment & Plan Note (Signed)
Persistent. Will treat with diflucan x 3 tablets. Will put on prophylactic diflucan for next 6 months after that. Will try to get better control of sugars by increasing metformin. See A/P for diabetes.

## 2011-02-22 NOTE — Assessment & Plan Note (Signed)
Etiology unclear. History not consistent with contact dermatitis and does not appear due to systemic infection since patient otherwise well. Seems to be resolving with cream from ED. Will give Vistaril to help with itchiness since benadryl hasn't been helping.

## 2011-02-23 ENCOUNTER — Telehealth: Payer: Self-pay | Admitting: Family Medicine

## 2011-02-23 DIAGNOSIS — B372 Candidiasis of skin and nail: Secondary | ICD-10-CM

## 2011-02-23 NOTE — Telephone Encounter (Signed)
Pt requesting info about the cream and the diflucan.  Advised that it looked as if the diflucan was printed instead of sent so I would call that in.  Since I did not see a cream on her list I would have to send a message to the MD about that, pt agreeable. [place], Maryjo [place]

## 2011-02-23 NOTE — Telephone Encounter (Signed)
Brittany Chung was here yesterday and the Rx for Diflucan and for the cream did not make it to the pharmacy, also she thinks there were some error on some of the Rx's.  She would like a call back asap.

## 2011-02-26 MED ORDER — TERBINAFINE HCL 1 % EX CREA: TOPICAL_CREAM | Freq: Two times a day (BID) | CUTANEOUS | Status: DC

## 2011-02-26 NOTE — Telephone Encounter (Signed)
Thank you [name].  We did not plan on using cream. She may use the Lamisil she has been using if she would like a cream as well. Will send in Rx now for it.  [name], will you please let patient know?

## 2011-03-03 ENCOUNTER — Other Ambulatory Visit: Payer: Self-pay | Admitting: Family Medicine

## 2011-03-03 NOTE — Telephone Encounter (Signed)
Refill request

## 2011-03-06 ENCOUNTER — Encounter (HOSPITAL_COMMUNITY): Payer: Medicare Other | Admitting: Physician Assistant

## 2011-03-12 ENCOUNTER — Encounter: Payer: Self-pay | Admitting: Family Medicine

## 2011-03-12 ENCOUNTER — Other Ambulatory Visit: Payer: Self-pay | Admitting: Internal Medicine

## 2011-03-12 ENCOUNTER — Other Ambulatory Visit: Payer: Self-pay | Admitting: Family Medicine

## 2011-03-12 ENCOUNTER — Ambulatory Visit (INDEPENDENT_AMBULATORY_CARE_PROVIDER_SITE_OTHER): Payer: Medicare Other | Admitting: Family Medicine

## 2011-03-12 DIAGNOSIS — B373 Candidiasis of vulva and vagina: Secondary | ICD-10-CM

## 2011-03-12 DIAGNOSIS — B3731 Acute candidiasis of vulva and vagina: Secondary | ICD-10-CM

## 2011-03-12 DIAGNOSIS — R21 Rash and other nonspecific skin eruption: Secondary | ICD-10-CM

## 2011-03-12 MED ORDER — PERMETHRIN 5 % EX CREA: TOPICAL_CREAM | CUTANEOUS | Status: DC

## 2011-03-12 MED ORDER — TERCONAZOLE 0.8 % VA CREA: 1.0000 | TOPICAL_CREAM | Freq: Every day | VAGINAL | Status: DC

## 2011-03-12 NOTE — Assessment & Plan Note (Signed)
Patient with severe rash around the groin area. This has been treated with Diflucan x3 every 3 days, and then once per week x2 weeks. She has had this rash for one year in the past symptom relief has come from Terazol cream. We will retry Terazol cream twice a day x2 weeks. Today we tried to culture for fungal growth. I recommended Domeboro compresses. I also told her to avoid all the ointment she's been using, especially anything her steroid. She should return to clinic in 2-3 weeks

## 2011-03-12 NOTE — Patient Instructions (Signed)
I am sorry that you have had this rash so long. Please try the Terazol cream twice a day for 14 days. Also, please use the permethrin cream once or twice depending on how it does. You apply the permethrin cream from the scalp line to you toes and leave it on for 10 hours. You wash off thoroughly. In one week if you're still itching apply again.  Please avoid all ointments or creams including anything with steroid. Please use domeboro soaks for this area. Ask your pharmacy for help finding this. Use these 4 times a day for 20 minutes at a time.  Please come back and see Dr. Madolyn Frieze when you finish your vacation.

## 2011-03-12 NOTE — Progress Notes (Signed)
  Subjective:    Patient ID: Brittany Chung, female    DOB: [birth year], 70 y.o.   MRN: [id number]  HPI  Rash-patient with severe groin rash x one year. She has tried multiple different treatments for this, including, nystatin cream baby soap, all well, Vaseline, zinc oxide, a and D. ointment, and other creams. She has been treated with Diflucan and is currently taking it once per week x2 weeks. She states is it is no better with the Diflucan. She notes bruising and crusting, as well as significant itching. She is too sleepy with the Atarax. She denies fevers. The rash on her arm she states has been present for one month. she states that her rash is itchier at night  Review of Systems Denies fever, nausea, vomiting, or diarrhea.    Objective:   Physical Exam Vital signs reviewed General appearance - alert, well appearing, and in no distress and oriented to person, place, and time EXT-patient with multiple red nonblanching lesions on her ventral forearms. These are excoriated. GYN-area from the sacrum to mons is red and cracked. This area extends several centimeters down her legs on both sides. There are red satellite lesions as well. There is crusting present throughout. Her vagina appears atrophic and red and cracked.       Assessment & Plan:

## 2011-03-12 NOTE — Assessment & Plan Note (Signed)
Will treat for scabies in case this is an issue. Gave permethrin with one refill.

## 2011-03-14 ENCOUNTER — Other Ambulatory Visit: Payer: Self-pay | Admitting: Family Medicine

## 2011-03-14 ENCOUNTER — Telehealth: Payer: Self-pay | Admitting: Family Medicine

## 2011-03-14 DIAGNOSIS — B373 Candidiasis of vulva and vagina: Secondary | ICD-10-CM

## 2011-03-14 DIAGNOSIS — B3731 Acute candidiasis of vulva and vagina: Secondary | ICD-10-CM

## 2011-03-14 MED ORDER — TERCONAZOLE 0.8 % VA CREA: TOPICAL_CREAM | VAGINAL | Status: DC

## 2011-03-14 NOTE — Telephone Encounter (Signed)
Pt states that she cannot find the Domeboro compresses - pharmacy states that they don't have it and can't find it anywhere Also Terezol cream is out and needs refill Also has questions for nurse Leaving in AM for a 2 week trip - needs advise asap Also the cream for her scalp & toes is really bad and needs advise

## 2011-03-14 NOTE — Telephone Encounter (Signed)
Patient states she cant find  The Domeboro compresses anywhere and wanted to know if MD has any other suggestions. Also she states that only gave her a small tube of the Terazol cream so she needs a refill because she is almost out and she will be going out of town tomm. Uses [facility]. Message to MD.

## 2011-03-14 NOTE — Telephone Encounter (Signed)
i sent in the terazol cream.  I dont have another suggestion.  i will forward to PCP to see if she does.  Possible brand names include Domeboro [OTC];  Gordon Boro-Packs [OTC];  Pedi-Boro [OTC]

## 2011-03-14 NOTE — Telephone Encounter (Signed)
Patient informed of message fromm MD

## 2011-03-15 ENCOUNTER — Other Ambulatory Visit: Payer: Self-pay | Admitting: Family Medicine

## 2011-03-15 NOTE — Telephone Encounter (Signed)
Refill request

## 2011-03-27 ENCOUNTER — Other Ambulatory Visit: Payer: Self-pay | Admitting: Family Medicine

## 2011-03-27 NOTE — Telephone Encounter (Signed)
Refill request

## 2011-04-08 LAB — FUNGUS CULTURE W SMEAR: Smear Result: NONE SEEN

## 2011-04-16 ENCOUNTER — Other Ambulatory Visit: Payer: Self-pay | Admitting: Family Medicine

## 2011-04-17 NOTE — Telephone Encounter (Signed)
Refill request

## 2011-04-24 ENCOUNTER — Other Ambulatory Visit (HOSPITAL_COMMUNITY): Payer: Self-pay

## 2011-04-25 ENCOUNTER — Ambulatory Visit (INDEPENDENT_AMBULATORY_CARE_PROVIDER_SITE_OTHER): Payer: Medicare Other | Admitting: Physician Assistant

## 2011-04-25 DIAGNOSIS — F988 Other specified behavioral and emotional disorders with onset usually occurring in childhood and adolescence: Secondary | ICD-10-CM

## 2011-04-25 MED ORDER — LISDEXAMFETAMINE DIMESYLATE 70 MG PO CAPS: 70.0000 mg | ORAL_CAPSULE | ORAL | Status: DC

## 2011-04-25 MED ORDER — AMPHETAMINE-DEXTROAMPHETAMINE 10 MG PO TABS: 10.0000 mg | ORAL_TABLET | Freq: Every day | ORAL | Status: DC

## 2011-04-25 NOTE — Progress Notes (Signed)
   [facility] Follow-up Outpatient Visit  Brittany Chung 09/05/1940  Date: 04/25/11   Subjective: Pt reports all is well overall.  Medications are working well.  15mg  dose of Remeron caused excessive morning somnolence, so has cut back to 7.5mg  as previously discussed.  Denies side-effects to meds, but has been diagnosed with an allergy to yellow dye.  Also, some difficulty with new insurance - [employer].  There were no vitals filed for this visit.  Mental Status Examination  Appearance: Neat dress and well groomed Alert: Yes Attention: good  Cooperative: Yes Eye Contact: Good Speech: Clear and even Psychomotor Activity: Normal Memory/Concentration: Intact Oriented: person, place, time/date and situation Mood: Euthymic Affect: Congruent Thought Processes and Associations: Linear Fund of Knowledge: Good Thought Content:  Insight: Good Judgement: Good  Diagnosis: ADHD, inattentive type  Treatment Plan: Continue Vyvanse and Adderall.  Decrease Remeron to 7.5mg .  Follow up in 3 months.  Domanique Huesman, PA

## 2011-05-04 ENCOUNTER — Other Ambulatory Visit: Payer: Self-pay | Admitting: Family Medicine

## 2011-05-06 NOTE — Telephone Encounter (Signed)
Refill request

## 2011-05-18 ENCOUNTER — Other Ambulatory Visit: Payer: Self-pay | Admitting: Family Medicine

## 2011-05-18 NOTE — Telephone Encounter (Signed)
Refill request

## 2011-05-23 ENCOUNTER — Other Ambulatory Visit: Payer: Self-pay | Admitting: Family Medicine

## 2011-05-23 ENCOUNTER — Other Ambulatory Visit (HOSPITAL_COMMUNITY): Payer: Self-pay | Admitting: Physician Assistant

## 2011-05-24 NOTE — Telephone Encounter (Signed)
Refill request

## 2011-05-25 ENCOUNTER — Other Ambulatory Visit (HOSPITAL_COMMUNITY): Payer: Self-pay | Admitting: Physician Assistant

## 2011-05-25 DIAGNOSIS — F988 Other specified behavioral and emotional disorders with onset usually occurring in childhood and adolescence: Secondary | ICD-10-CM

## 2011-05-25 MED ORDER — MIRTAZAPINE 15 MG PO TABS: 15.0000 mg | ORAL_TABLET | Freq: Every day | ORAL | Status: DC

## 2011-06-08 ENCOUNTER — Other Ambulatory Visit: Payer: Self-pay | Admitting: Family Medicine

## 2011-06-08 NOTE — Telephone Encounter (Signed)
Refill request

## 2011-06-20 ENCOUNTER — Other Ambulatory Visit: Payer: Self-pay | Admitting: Family Medicine

## 2011-06-20 NOTE — Telephone Encounter (Signed)
Refill request

## 2011-06-20 NOTE — Telephone Encounter (Signed)
Patient needs appointment in the next 2 months for medication refills.

## 2011-06-21 NOTE — Telephone Encounter (Signed)
Appointment scheduled  With PCP.

## 2011-07-04 ENCOUNTER — Other Ambulatory Visit: Payer: Self-pay | Admitting: Family Medicine

## 2011-07-04 ENCOUNTER — Ambulatory Visit: Payer: Medicare Other | Admitting: Family Medicine

## 2011-07-04 MED ORDER — QUETIAPINE FUMARATE ER 50 MG PO TB24: 100.0000 mg | ORAL_TABLET | Freq: Every day | ORAL | Status: DC

## 2011-07-09 ENCOUNTER — Other Ambulatory Visit: Payer: Self-pay | Admitting: Family Medicine

## 2011-07-09 NOTE — Telephone Encounter (Signed)
Filled Rx for Seroquel 01/30 but pharmacy reports they did not get this Rx (electronic). Verbal order over phone for Seroquel #60, no re-fills.  Will need appointment for any more re-fills. Patient [facility] last appointment.

## 2011-07-10 ENCOUNTER — Other Ambulatory Visit: Payer: Self-pay | Admitting: Family Medicine

## 2011-07-10 MED ORDER — QUETIAPINE FUMARATE ER 50 MG PO TB24: 100.0000 mg | ORAL_TABLET | Freq: Every day | ORAL | Status: DC

## 2011-07-13 ENCOUNTER — Ambulatory Visit (INDEPENDENT_AMBULATORY_CARE_PROVIDER_SITE_OTHER): Payer: Medicare HMO | Admitting: Family Medicine

## 2011-07-13 ENCOUNTER — Encounter: Payer: Self-pay | Admitting: Family Medicine

## 2011-07-13 VITALS — BP 133/71 | HR 93 | Temp 98.2°F | Ht 63.6 in | Wt 160.0 lb

## 2011-07-13 DIAGNOSIS — I10 Essential (primary) hypertension: Secondary | ICD-10-CM

## 2011-07-13 DIAGNOSIS — E119 Type 2 diabetes mellitus without complications: Secondary | ICD-10-CM

## 2011-07-13 DIAGNOSIS — E785 Hyperlipidemia, unspecified: Secondary | ICD-10-CM

## 2011-07-13 LAB — COMPREHENSIVE METABOLIC PANEL
ALT: 22 U/L (ref 0–35)
AST: 30 U/L (ref 0–37)
Alkaline Phosphatase: 67 U/L (ref 39–117)
BUN: 22 mg/dL (ref 6–23)
Chloride: 100 mEq/L (ref 96–112)
Creat: 1.05 mg/dL (ref 0.50–1.10)
Potassium: 4.3 mEq/L (ref 3.5–5.3)

## 2011-07-13 LAB — POCT GLYCOSYLATED HEMOGLOBIN (HGB A1C): Hemoglobin A1C: 7.7

## 2011-07-13 LAB — LIPID PANEL: HDL: 60 mg/dL (ref >39)

## 2011-07-13 MED ORDER — METFORMIN HCL 500 MG PO TABS: 500.0000 mg | ORAL_TABLET | Freq: Two times a day (BID) | ORAL | Status: DC

## 2011-07-13 NOTE — Progress Notes (Signed)
  Subjective:    Patient ID: Brittany Chung, female    DOB: 1940-09-08, 71 y.o.   MRN: [id number]  HPI Follow-up: 1. HTN Increased enalapril to 5mg  3 days ago ROS: denies chest pain, headaches  2. T2DM Decreased metformin to 500 mg daily. Told by a provider to decrease. ROS: denies paresthesias, vision changes (history of glaucoma; followed by opthalmologist)  3. HLD Compliant with statin ROS: denies RUQ pain, myalgias  Review of Systems     Objective:   Physical Exam Gen: NAD CV: RRR without m/r/g Pulm: CTAB without w/r/r Ext: no edema Feet: no lesions; clean; toe nails trimmed; sensation intact; 2+ pedal pulses    Assessment & Plan:

## 2011-07-13 NOTE — Assessment & Plan Note (Signed)
HgbA1c remains elevated at 7.7.  Increase metformin from qd to bid. Follow-up in 1 month. Will increase metformin if patient tolerating.

## 2011-07-13 NOTE — Patient Instructions (Signed)
Increase your metformin to 1 tablet twice a day.   If your lab results are normal, I will send you a letter with the results. If abnormal, someone at the clinic will get in touch with you.   Follow-up in 3 months for your diabetes. Follow-up at your convenience to discuss your knee pain.   I am glad to see you today and that your rash is better.

## 2011-07-13 NOTE — Assessment & Plan Note (Signed)
Controlled. Continue current enalapril dose.

## 2011-07-13 NOTE — Assessment & Plan Note (Addendum)
Lipid panel today.  UPDATE: LDL 81, HDL 60. Continue simvastatin 40.

## 2011-07-14 ENCOUNTER — Other Ambulatory Visit (HOSPITAL_COMMUNITY): Payer: Self-pay | Admitting: Physician Assistant

## 2011-07-14 DIAGNOSIS — F988 Other specified behavioral and emotional disorders with onset usually occurring in childhood and adolescence: Secondary | ICD-10-CM

## 2011-07-16 ENCOUNTER — Encounter: Payer: Self-pay | Admitting: Family Medicine

## 2011-07-25 ENCOUNTER — Ambulatory Visit (INDEPENDENT_AMBULATORY_CARE_PROVIDER_SITE_OTHER): Payer: Medicare HMO | Admitting: Physician Assistant

## 2011-07-25 DIAGNOSIS — F988 Other specified behavioral and emotional disorders with onset usually occurring in childhood and adolescence: Secondary | ICD-10-CM

## 2011-07-25 MED ORDER — AMPHETAMINE-DEXTROAMPHETAMINE 20 MG PO TABS: 20.0000 mg | ORAL_TABLET | Freq: Every day | ORAL | Status: DC

## 2011-07-25 MED ORDER — LISDEXAMFETAMINE DIMESYLATE 70 MG PO CAPS: 70.0000 mg | ORAL_CAPSULE | ORAL | Status: DC

## 2011-07-25 NOTE — Progress Notes (Signed)
   [facility] Follow-up Outpatient Visit  MYCA PERNO November 07, 1940  Date: 07/25/11   Subjective: Brittany Chung presents today to followup on her medications prescribed for ADHD. She reports that the 10 mg of Adderall to be taken in the afternoon has not seemed to make much difference. Also she has received a letter from her insurance company stating that they do not want to cover the cost of Vyvanse. She would like to continue the Vyvanse, and prefers to have it dosed twice daily. She has been struggling recently with skin allergies and has been seeing a dermatologist and will soon be seeing an allergist.  There were no vitals filed for this visit.  Mental Status Examination  Appearance: Well groomed and dressed Alert: Yes Attention: good  Cooperative: Yes Eye Contact: Good Speech: Clear and even Psychomotor Activity: Normal Memory/Concentration: Intact Oriented: person, place, time/date and situation Mood: Euthymic Affect: Appropriate Thought Processes and Associations: Linear Fund of Knowledge: Good Thought Content:  Insight: Good Judgement: Good  Diagnosis: ADHD inattentive type  Treatment Plan: Continue Vyvanse at 70 mg daily. Increase Adderall to 20 mg daily in the afternoon. Continue Remeron for sleep, and followup in 3 months.  Dioselina Brumbaugh, PA

## 2011-07-27 ENCOUNTER — Ambulatory Visit: Payer: Medicare HMO | Admitting: Family Medicine

## 2011-08-06 ENCOUNTER — Other Ambulatory Visit: Payer: Self-pay | Admitting: Family Medicine

## 2011-08-06 NOTE — Telephone Encounter (Signed)
Refill request

## 2011-08-07 NOTE — Telephone Encounter (Signed)
Will need to follow-up with psychiatrist/psychologist and forward note to me if she would like more medication after 2 refills.

## 2011-08-08 ENCOUNTER — Telehealth (HOSPITAL_COMMUNITY): Payer: Self-pay | Admitting: *Deleted

## 2011-08-08 ENCOUNTER — Encounter (HOSPITAL_COMMUNITY): Payer: Self-pay | Admitting: *Deleted

## 2011-08-08 NOTE — Progress Notes (Unsigned)
[employer] authorized Vyvanse 70mg , once daily through 06/03/2012

## 2011-08-08 NOTE — Telephone Encounter (Signed)
[employer] authorized Amphetamine Salts 20 mg #30/30days until 06/03/12.

## 2011-08-25 ENCOUNTER — Other Ambulatory Visit: Payer: Self-pay | Admitting: Family Medicine

## 2011-09-13 ENCOUNTER — Other Ambulatory Visit: Payer: Self-pay | Admitting: Family Medicine

## 2011-09-20 ENCOUNTER — Encounter: Payer: Self-pay | Admitting: Family Medicine

## 2011-09-20 ENCOUNTER — Ambulatory Visit (HOSPITAL_COMMUNITY)
Admission: RE | Admit: 2011-09-20 | Discharge: 2011-09-20 | Disposition: A | Discharge status: Home / Self Care | Payer: Medicare HMO | Source: Ambulatory Visit | Attending: Family Medicine | Admitting: Family Medicine

## 2011-09-20 ENCOUNTER — Ambulatory Visit (INDEPENDENT_AMBULATORY_CARE_PROVIDER_SITE_OTHER): Payer: Medicare HMO | Admitting: Family Medicine

## 2011-09-20 VITALS — BP 109/67 | HR 93 | Temp 98.2°F | Ht 63.6 in | Wt 160.0 lb

## 2011-09-20 DIAGNOSIS — M25561 Pain in right knee: Secondary | ICD-10-CM

## 2011-09-20 DIAGNOSIS — M899 Disorder of bone, unspecified: Secondary | ICD-10-CM | POA: Insufficient documentation

## 2011-09-20 DIAGNOSIS — M25569 Pain in unspecified knee: Secondary | ICD-10-CM | POA: Insufficient documentation

## 2011-09-20 NOTE — Patient Instructions (Signed)
Let's check an XR.  I will call you tomorrow with results. If I don't call you by tomorrow afternoon, please call clinic and remind me of results.

## 2011-09-20 NOTE — Progress Notes (Signed)
  Subjective:    Patient ID: Brittany Chung, female    DOB: 11-08-1940, 71 y.o.   MRN: [id number]  HPI Acute visit: chronic right knee pain  Has been bothering her for the past few years.  Not particularly worse recently but wanted it checked out now that other medical problems have stabilized.   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   Review of Systems Denies fevers/chills Denies numbness    Objective:   Physical Exam Gen: NAD Psych: engaged, appropriate MSK:   Right knee: no swelling, erythema, tenderness   Left knee: no swelling, erythema     Mild-moderate TTP right medial knee, superior tibia     No pain with movement of patella or with ROM of knee which is intact Gait: normal, no limp    Assessment & Plan:

## 2011-09-21 ENCOUNTER — Telehealth: Payer: Self-pay | Admitting: Family Medicine

## 2011-09-21 NOTE — Telephone Encounter (Signed)
Called and discussed x-ray results consistent with arthritis.  Patient reveals she had been concerned about tumor.  Patient would prefer to monitor for now. May be interested in steroid injection in the future.

## 2011-09-21 NOTE — Telephone Encounter (Signed)
Pt is asking for results of her xray

## 2011-09-21 NOTE — Assessment & Plan Note (Signed)
Patient comes in today to have her right knee pain evaluated.  She has had this pain for years but is discussing it today for the first time. The pain is not worse than it usually is chronically.  The characterization of the pain seems consistent with osteoarthritis (worse when she initiates movement, then improves; chronicity; patient's age), however, the patient also reports having arthritis in her shoulders, and the pain feels different. The pain is also located in what seems to be in the knee, in the upper aspect of the tibia. No redness, swelling, warmth.  Possibilities for the pain include osteoarthritis, CPPD, gout, RA. Bursitis is also possible but seems less likely.  Will check knee XR and call and discuss results and management with patient.

## 2011-10-10 ENCOUNTER — Encounter: Payer: Self-pay | Admitting: Obstetrics and Gynecology

## 2011-10-12 ENCOUNTER — Encounter: Payer: Self-pay | Admitting: Family Medicine

## 2011-10-12 DIAGNOSIS — H402290 Chronic angle-closure glaucoma, unspecified eye, stage unspecified: Secondary | ICD-10-CM

## 2011-10-12 NOTE — Assessment & Plan Note (Signed)
Doc only. 05/09 appt. No documentation about progress/stability of condition. Assume stable since no new changes in meds. Follow-up 05/14 dilated eye exam.

## 2011-10-22 ENCOUNTER — Ambulatory Visit (INDEPENDENT_AMBULATORY_CARE_PROVIDER_SITE_OTHER): Payer: Medicare HMO | Admitting: Physician Assistant

## 2011-10-22 DIAGNOSIS — F988 Other specified behavioral and emotional disorders with onset usually occurring in childhood and adolescence: Secondary | ICD-10-CM

## 2011-10-22 DIAGNOSIS — F909 Attention-deficit hyperactivity disorder, unspecified type: Secondary | ICD-10-CM

## 2011-10-22 DIAGNOSIS — F39 Unspecified mood [affective] disorder: Secondary | ICD-10-CM

## 2011-10-22 MED ORDER — AMPHETAMINE-DEXTROAMPHETAMINE 20 MG PO TABS: 20.0000 mg | ORAL_TABLET | Freq: Every day | ORAL | Status: DC

## 2011-10-22 MED ORDER — LISDEXAMFETAMINE DIMESYLATE 70 MG PO CAPS: 70.0000 mg | ORAL_CAPSULE | ORAL | Status: DC

## 2011-10-22 NOTE — Progress Notes (Signed)
[facility] [address] Progress Note  Brittany Chung [id number] 71 y.o.  10/22/2011 5:07 PM  Chief Complaint: ADHD and episode of depression  History of Present Illness: Brittany Chung presents today to followup on her medications prescribed for ADHD. She reports that she underwent an episode of severe depression that last about 2 weeks. She reports that she is doing better now. During this period of depression she had a desire to isolate and sleep all the time. She also reports that she had a decreased interest and was completely unproductive. She reports that this episode occurred after she had spent 6 weeks helping a friend who was at the cancer Center receiving radiation. She reports that she has had episodes like this in the past, and she was prescribed medications that were always amphetamine-based. She has not had an episode of this type since she was started on Vyvanse. She denies any current suicidal or homicidal ideation. She denies any auditory or visual hallucinations.  Suicidal Ideation: No Plan Formed: No Patient has means to carry out plan: No  Homicidal Ideation: No Plan Formed: No Patient has means to carry out plan: No  Review of Systems: Psychiatric: Agitation: No Hallucination: No Depressed Mood: Yes Insomnia: No Hypersomnia: Yes Altered Concentration: Yes Feels Worthless: No Grandiose Ideas: No Belief In Special Powers: No New/Increased Substance Abuse: No Compulsions: No  Neurologic: Headache: No Seizure: No Paresthesias: No    Outpatient Encounter Prescriptions as of 10/22/2011  Medication Sig Dispense Refill  . amphetamine-dextroamphetamine (ADDERALL) 20 MG tablet Take 1 tablet (20 mg total) by mouth daily.  30 tablet  0  . amphetamine-dextroamphetamine (ADDERALL) 20 MG tablet Take 1 tablet (20 mg total) by mouth daily.  30 tablet  0  . amphetamine-dextroamphetamine (ADDERALL) 20 MG tablet Take 1 tablet (20 mg total) by mouth daily.  30 tablet  0  .  aspirin 81 MG chewable tablet Chew 81 mg by mouth daily.        . bimatoprost (LUMIGAN) 0.03 % ophthalmic drops Place 1 drop into both eyes at bedtime.        . enalapril (VASOTEC) 5 MG tablet Take 5 mg by mouth daily.      Marland Kitchen glucose monitoring kit (FREESTYLE) monitoring kit 1 each by Does not apply route as needed for other.  1 each  1  . lidocaine (XYLOCAINE) 2 % jelly Apply topically daily. Prior to using Terazol ointment.  30 mL  0  . lisdexamfetamine (VYVANSE) 70 MG capsule Take 1 capsule (70 mg total) by mouth every morning.  30 capsule  0  . lisdexamfetamine (VYVANSE) 70 MG capsule Take 1 capsule (70 mg total) by mouth every morning.  30 capsule  0  . lisdexamfetamine (VYVANSE) 70 MG capsule Take 1 capsule (70 mg total) by mouth every morning.  30 capsule  0  . metFORMIN (GLUCOPHAGE) 500 MG tablet Take 1 tablet (500 mg total) by mouth 2 (two) times daily with a meal.  60 tablet  3  . miconazole (MICATIN) 2 % cream Apply to affected area 2 times daily outside and inside the vagina for 5 days.  45 g  1  . mirtazapine (REMERON) 15 MG tablet TAKE 1 TABLET BY MOUTH AT BEDTIME  30 tablet  2  . Multiple Vitamins-Minerals (MULTIVITAMIN WITH MINERALS) tablet Take 1 tablet by mouth daily.        Marland Kitchen rOPINIRole (REQUIP) 1 MG tablet TAKE 1 TABLET BY MOUTH AT BEDTIME  30 tablet  2  .  SEROQUEL XR 50 MG TB24 TAKE 2 TABLETS BY MOUTH AT BEDTIME  60 tablet  2  . simvastatin (ZOCOR) 40 MG tablet TAKE 1 TABLET EVERY DAY FOR CHOLESTEROL  30 tablet  3  . STRATTERA 80 MG capsule TAKE ONE CAPSULE BY MOUTH EVERY DAY  30 capsule  2  . terconazole (TERAZOL 3) 0.8 % vaginal cream Place 1 applicator vaginally at bedtime.  20 g  0  . terconazole (TERAZOL 7) 0.4 % vaginal cream Place 1 applicator vaginally at bedtime.  45 g  0  . DISCONTD: amphetamine-dextroamphetamine (ADDERALL) 20 MG tablet Take 1 tablet (20 mg total) by mouth daily.  30 tablet  0  . DISCONTD: amphetamine-dextroamphetamine (ADDERALL) 20 MG tablet Take 1  tablet (20 mg total) by mouth daily.  30 tablet  0  . DISCONTD: amphetamine-dextroamphetamine (ADDERALL) 20 MG tablet Take 1 tablet (20 mg total) by mouth daily.  30 tablet  0  . DISCONTD: lisdexamfetamine (VYVANSE) 70 MG capsule Take 1 capsule (70 mg total) by mouth every morning.  30 capsule  0  . DISCONTD: lisdexamfetamine (VYVANSE) 70 MG capsule Take 1 capsule (70 mg total) by mouth every morning.  30 capsule  0  . DISCONTD: lisdexamfetamine (VYVANSE) 70 MG capsule Take 1 capsule (70 mg total) by mouth every morning.  30 capsule  0    Past Psychiatric History/Hospitalization(s): Anxiety: No Bipolar Disorder: No Depression: No Mania: No Psychosis: No Schizophrenia: No Personality Disorder: No Hospitalization for psychiatric illness: No History of Electroconvulsive Shock Therapy: No Prior Suicide Attempts: No  Physical Exam: Constitutional:  There were no vitals taken for this visit.  General Appearance: alert, oriented, no acute distress and well nourished  Musculoskeletal: Strength & Muscle Tone: within normal limits Gait & Station: normal Patient leans: N/A  Psychiatric: Speech (describe rate, volume, coherence, spontaneity, and abnormalities if any): Speech is at normal rate and volume with normal parents spontaneity  Thought Process (describe rate, content, abstract reasoning, and computation): Thought also says are within normal limits of rate content and abstract reasoning.  Associations: Coherent, Relevant and Intact  Thoughts: normal  Mental Status: Orientation: oriented to person, place, time/date and situation Mood & Affect: normal affect Attention Span & Concentration: Within normal limits  Medical Decision Making (Choose Three): Established Problem, Stable/Improving (1), New Problem, with no additional work-up planned (3) and Review of Medication Regimen & Side Effects (2)  Assessment: Axis I: ADHD, mood disorder NOS  Axis II:  deferred  Axis III:  Hyperlipidemia, irritable bowel syndrome, narrow angle glaucoma, knee pain, hypertension, diabetes type 2  Axis IV: Moderate  Axis V: 65   Plan: We'll continue her Vyvanse at 70 mg each morning and Adderall 20 mg each afternoon. As she has cut back on her Seroquel XR as prescribed by another provider, I have instructed her to increase her Seroquel back to 100 mg at bedtime and hold the Remeron. She will return for followup in 3 months. She is encouraged to call if any more mood disturbances occur. We may consider a mood stabilizer if necessary.  Helina Hullum, PA-C 10/22/2011

## 2011-10-24 ENCOUNTER — Encounter: Payer: Self-pay | Admitting: Obstetrics and Gynecology

## 2011-10-24 ENCOUNTER — Ambulatory Visit (INDEPENDENT_AMBULATORY_CARE_PROVIDER_SITE_OTHER): Payer: Medicare HMO | Admitting: Obstetrics and Gynecology

## 2011-10-24 VITALS — BP 118/60 | Ht 63.5 in | Wt 158.0 lb

## 2011-10-24 DIAGNOSIS — Z01419 Encounter for gynecological examination (general) (routine) without abnormal findings: Secondary | ICD-10-CM

## 2011-10-24 DIAGNOSIS — R102 Pelvic and perineal pain: Secondary | ICD-10-CM

## 2011-10-24 DIAGNOSIS — N9489 Other specified conditions associated with female genital organs and menstrual cycle: Secondary | ICD-10-CM

## 2011-10-24 NOTE — Progress Notes (Signed)
The patient is not taking hormone replacement therapy The patient  is taking a Calcium supplement. Post-menopausal bleeding:no  Last Pap: patient does not recall when last pap was  Last mammogram: was normal 2012 Last DEXA scan : None Last colonoscopy:was normal 8 years ago  Urinary symptoms: leaks with urgency, denies leaking with movement, cough, laugh or sneezing (h/o glaucoma surgery) Normal bowel movements: Yes Reports abuse at home: No:   Please see above for chief complaints and also c/o pelvic discomfort  Filed Vitals:   10/24/11 0924  BP: 118/60   ROS: noncontributory  Physical Examination: General appearance - alert, well appearing, and in no distress Neck - supple, no significant adenopathy Chest - clear to auscultation, no wheezes, rales or rhonchi, symmetric air entry Heart - normal rate and regular rhythm Abdomen - soft, nontender, nondistended, no masses or organomegaly Breasts - breasts appear normal, no suspicious masses, no skin or nipple changes or axillary nodes Pelvic - normal external genitalia, vulva, vagina, cervix, uterus and adnexa, significant atrophy, no evidence of significant cystocele or rectocele Back exam - no CVAT Extremities - no edema, redness or tenderness in the calves or thighs   A/P OAB - Patient has appointment in June with ophthalmologist and will ask at that time if her glaucoma was opened angle or closed angle. Currently with a history of glaucoma she is not a candidate for medicines to treat overactive bladder. Atrophic vagina U/S next available Pt needs BDS sched mammo

## 2011-11-01 ENCOUNTER — Telehealth: Payer: Self-pay | Admitting: Family Medicine

## 2011-11-01 ENCOUNTER — Other Ambulatory Visit: Payer: Self-pay

## 2011-11-01 ENCOUNTER — Telehealth: Payer: Self-pay

## 2011-11-01 DIAGNOSIS — Z13820 Encounter for screening for osteoporosis: Secondary | ICD-10-CM

## 2011-11-01 NOTE — Telephone Encounter (Signed)
Patient is calling because she received a packet through Medicare and was instructed to make her MD aware.  The product is a Pull Tab system that gives her back support and they will be contacting her PCP to see if the PCP will approve her to get it for free.  ID # [id number]

## 2011-11-07 NOTE — Telephone Encounter (Signed)
Chart note to close encounter. Brittany Chung, [name]  

## 2011-11-08 ENCOUNTER — Ambulatory Visit (INDEPENDENT_AMBULATORY_CARE_PROVIDER_SITE_OTHER): Payer: Medicare HMO | Admitting: Obstetrics and Gynecology

## 2011-11-08 ENCOUNTER — Encounter: Payer: Self-pay | Admitting: Obstetrics and Gynecology

## 2011-11-08 ENCOUNTER — Other Ambulatory Visit (INDEPENDENT_AMBULATORY_CARE_PROVIDER_SITE_OTHER): Payer: Medicare HMO

## 2011-11-08 ENCOUNTER — Ambulatory Visit (INDEPENDENT_AMBULATORY_CARE_PROVIDER_SITE_OTHER): Payer: Medicare HMO

## 2011-11-08 VITALS — BP 122/58 | Resp 16 | Ht 63.5 in | Wt 163.0 lb

## 2011-11-08 DIAGNOSIS — M899 Disorder of bone, unspecified: Secondary | ICD-10-CM

## 2011-11-08 DIAGNOSIS — N949 Unspecified condition associated with female genital organs and menstrual cycle: Secondary | ICD-10-CM

## 2011-11-08 DIAGNOSIS — Z13820 Encounter for screening for osteoporosis: Secondary | ICD-10-CM

## 2011-11-08 DIAGNOSIS — M8580 Other specified disorders of bone density and structure, unspecified site: Secondary | ICD-10-CM

## 2011-11-08 DIAGNOSIS — N9489 Other specified conditions associated with female genital organs and menstrual cycle: Secondary | ICD-10-CM

## 2011-11-08 DIAGNOSIS — R102 Pelvic and perineal pain: Secondary | ICD-10-CM

## 2011-11-08 NOTE — Progress Notes (Signed)
Addended by: Stephens Shire on: 11/08/2011 03:02 PM   Modules accepted: Orders

## 2011-11-08 NOTE — Progress Notes (Signed)
Pt here to f/u u/s secondary to pelvic discomfort and will have BDS today as well U/S Reviewed - UT 10.6x1.7x4.0, ovaries not clearly visualized but no masses in adnexa, EE 0.3cm  Filed Vitals:   11/08/11 1205  BP: 122/58  Resp: 16   A/P BDS today - Osteopenia - check vit D If still with pelvic discomfort, I rec pt see 1 MD

## 2011-11-12 ENCOUNTER — Other Ambulatory Visit: Payer: Self-pay

## 2011-11-12 ENCOUNTER — Telehealth: Payer: Self-pay

## 2011-11-12 DIAGNOSIS — E559 Vitamin D deficiency, unspecified: Secondary | ICD-10-CM

## 2011-11-12 NOTE — Telephone Encounter (Signed)
Pt was called and given VIT-D protocol. RX was called to [facility] for Vit-D softgels 50,000 units 1 capsule 1x wk for 12 wks #20 0rf. Future lab ordered. Brittany Chung

## 2011-11-24 ENCOUNTER — Other Ambulatory Visit (HOSPITAL_COMMUNITY): Payer: Self-pay | Admitting: Physician Assistant

## 2011-11-26 ENCOUNTER — Other Ambulatory Visit: Payer: Self-pay | Admitting: Family Medicine

## 2011-11-26 ENCOUNTER — Encounter: Payer: Self-pay | Admitting: *Deleted

## 2011-11-26 NOTE — Telephone Encounter (Signed)
This encounter was created in error - please disregard.

## 2011-12-10 ENCOUNTER — Other Ambulatory Visit: Payer: Self-pay | Admitting: *Deleted

## 2011-12-10 MED ORDER — SIMVASTATIN 40 MG PO TABS: 40.0000 mg | ORAL_TABLET | Freq: Every day | ORAL | Status: DC

## 2011-12-11 ENCOUNTER — Other Ambulatory Visit: Payer: Self-pay | Admitting: *Deleted

## 2011-12-13 MED ORDER — ROPINIROLE HCL 1 MG PO TABS: 1.0000 mg | ORAL_TABLET | Freq: Every day | ORAL | Status: DC

## 2011-12-29 ENCOUNTER — Other Ambulatory Visit: Payer: Self-pay | Admitting: Family Medicine

## 2012-01-07 ENCOUNTER — Other Ambulatory Visit: Payer: Self-pay | Admitting: Family Medicine

## 2012-01-07 ENCOUNTER — Telehealth: Payer: Self-pay | Admitting: Family Medicine

## 2012-01-07 NOTE — Telephone Encounter (Signed)
She is out of her requip and wants to know if she can have the doctor increase the dose.  She has an appt on 8/16, but is asking for it to be increased since it doesn't seem to be helping as much any more.  [facility]

## 2012-01-09 MED ORDER — ROPINIROLE HCL 1 MG PO TABS: 2.0000 mg | ORAL_TABLET | Freq: Every day | ORAL | Status: DC

## 2012-01-09 NOTE — Telephone Encounter (Signed)
Please notify patient Rx with increased dose sent in Also, please notify her that medication changes prior to an appointment will not be done in the future

## 2012-01-18 ENCOUNTER — Encounter: Payer: Self-pay | Admitting: Family Medicine

## 2012-01-18 ENCOUNTER — Ambulatory Visit (INDEPENDENT_AMBULATORY_CARE_PROVIDER_SITE_OTHER): Payer: Medicare HMO | Admitting: Family Medicine

## 2012-01-18 VITALS — BP 103/50 | HR 83 | Temp 98.0°F | Ht 63.5 in | Wt 155.0 lb

## 2012-01-18 DIAGNOSIS — I10 Essential (primary) hypertension: Secondary | ICD-10-CM

## 2012-01-18 DIAGNOSIS — E559 Vitamin D deficiency, unspecified: Secondary | ICD-10-CM | POA: Insufficient documentation

## 2012-01-18 DIAGNOSIS — G2581 Restless legs syndrome: Secondary | ICD-10-CM

## 2012-01-18 DIAGNOSIS — R209 Unspecified disturbances of skin sensation: Secondary | ICD-10-CM

## 2012-01-18 LAB — CBC
HCT: 36.2 % (ref 36.0–46.0)
Hemoglobin: 12.5 g/dL (ref 12.0–15.0)
MCH: 29.7 pg (ref 26.0–34.0)
MCHC: 34.5 g/dL (ref 30.0–36.0)
MCV: 86 fL (ref 78.0–100.0)
RBC: 4.21 MIL/uL (ref 3.87–5.11)

## 2012-01-18 MED ORDER — ENALAPRIL MALEATE 2.5 MG PO TABS: 2.5000 mg | ORAL_TABLET | Freq: Every day | ORAL | Status: DC

## 2012-01-18 NOTE — Assessment & Plan Note (Signed)
-  Will repeat vitamin D level now she finished course of high dose vitamin D. Gynecologist had ordered for 09/02, but since checking other labs today, will check this as well. Gynecologist is in [place].

## 2012-01-18 NOTE — Assessment & Plan Note (Addendum)
BP have been on lower side or normal past several visits Will stop enalapril 5>>>Addendum. Renoprotective affect with diabetes. Will try decrease to 2.5>>>Called and left message with patient's daughter

## 2012-01-18 NOTE — Patient Instructions (Addendum)
For restless legs: -Try Requip 2-4 tablets at nighttime  If your lab results are normal, I will send you a letter with the results. If abnormal, someone at the clinic will get in touch with you.   For hypertension: -Stop enalapril -Check BP at pharmacy at least once a week for next 4 weeks. If consistently >130-140/90, you may re-start the enalapril.   Follow-up in 6 months or sooner if needed

## 2012-01-18 NOTE — Progress Notes (Signed)
  Subjective:    Patient ID: Brittany Chung, female    DOB: [birth year], 71 y.o.   MRN: [id number]  HPI # Restless leg syndrome She stopped taking Requip and then restarted at 1 mg instead of 2 mg because of cramping she was getting in her lower extremities Now she feels Requip is not working as well and wanted to discuss alternate medications ROS: denies calf pain, difficulty breathing, swelling in legs  # Hypertension Compliant with enalapril ROS: denies lightheadedness  # Vitamin D deficiency Checked at gynecologist's office and vitamin D found to be low Finished course of high dose vitamin D and requesting re-testing  Review of Systems Per HPI  Allergies, medication, past medical history reviewed.  Significant for: -T2DM -Genital herpes -HLD -IBS -Vitamin D deficiency -Chronic narrow angle glaucoma -ADHD    Objective:   Physical Exam GEN: NAD PULM: NI WOB EXT: no calf swelling, negative Homan's, no erythema/warmth/tenderness    Assessment & Plan:

## 2012-01-18 NOTE — Assessment & Plan Note (Addendum)
Worsening symptoms since decreased her dose of Requip due to cramping -Try Requip 2-4 mg daily -Will check BMET, vitamin B-12 for cramping

## 2012-01-19 LAB — TSH: TSH: 0.963 u[IU]/mL (ref 0.350–4.500)

## 2012-01-19 LAB — VITAMIN D 25 HYDROXY (VIT D DEFICIENCY, FRACTURES): Vit D, 25-Hydroxy: 30 ng/mL (ref 30–89)

## 2012-01-19 LAB — BASIC METABOLIC PANEL
BUN: 18 mg/dL (ref 6–23)
CO2: 26 mEq/L (ref 19–32)
Creat: 1.01 mg/dL (ref 0.50–1.10)
Glucose, Bld: 162 mg/dL — ABNORMAL HIGH (ref 70–99)
Sodium: 139 mEq/L (ref 135–145)

## 2012-01-19 LAB — VITAMIN B12: Vitamin B-12: 612 pg/mL (ref 211–911)

## 2012-01-21 ENCOUNTER — Telehealth: Payer: Self-pay | Admitting: Family Medicine

## 2012-01-21 MED ORDER — ERGOCALCIFEROL 1.25 MG (50000 UT) PO CAPS: 50000.0000 [IU] | ORAL_CAPSULE | ORAL | Status: DC

## 2012-01-21 NOTE — Telephone Encounter (Signed)
Vitamin improved but from 28 to 30. I think she only took a few weeks of high dose vitamin D. Will advise take for 6 weeks longer.   Discussed other normal labs.

## 2012-01-28 ENCOUNTER — Ambulatory Visit (INDEPENDENT_AMBULATORY_CARE_PROVIDER_SITE_OTHER): Payer: Medicare HMO | Admitting: Physician Assistant

## 2012-01-28 DIAGNOSIS — F39 Unspecified mood [affective] disorder: Secondary | ICD-10-CM

## 2012-01-28 DIAGNOSIS — F988 Other specified behavioral and emotional disorders with onset usually occurring in childhood and adolescence: Secondary | ICD-10-CM

## 2012-01-28 MED ORDER — AMPHETAMINE-DEXTROAMPHETAMINE 20 MG PO TABS: 20.0000 mg | ORAL_TABLET | Freq: Every day | ORAL | Status: DC

## 2012-01-28 MED ORDER — SERTRALINE HCL 50 MG PO TABS: 50.0000 mg | ORAL_TABLET | Freq: Every day | ORAL | Status: DC

## 2012-01-28 MED ORDER — LISDEXAMFETAMINE DIMESYLATE 70 MG PO CAPS: 70.0000 mg | ORAL_CAPSULE | ORAL | Status: DC

## 2012-01-28 NOTE — Progress Notes (Signed)
   [facility] Follow-up Outpatient Visit  Brittany Chung [birth year]  Date: 01/28/2012   Subjective: Brittany Chung presents today complaining of some depressive symptoms for about the past month. She reports that she has trouble getting out of bed in the morning, and that her energy is low through the day. She is experiencing some sadness and feelings of helplessness, but denies hopelessness. She also reports that she has been irritable, and has been isolating. She denies any suicidal or homicidal ideation. She denies any auditory or visual hallucinations. She admits that much of this may be situational, as her son-in-law is "driving her crazy." She endorses that she sleeps well by taking the Remeron. Her appetite is good, and she eats healthfully. She has discontinued her Seroquel. She denies that she gets any exercise, and reports that in the past she has walked, with good result in treating her mood.  There were no vitals filed for this visit.  Mental Status Examination  Appearance: Well groomed and casually dressed Alert: Yes Attention: good  Cooperative: Yes Eye Contact: Good Speech: Clear and coherent Psychomotor Activity: Normal Memory/Concentration: Intact Oriented: person, place, time/date and situation Mood: Dysphoric Affect: Blunt Thought Processes and Associations: Goal Directed and Logical Fund of Knowledge: Fair Thought Content: Normal Insight: Fair Judgement: Good  Diagnosis: ADHD, inattentive type; mood disorder not otherwise specified  Treatment Plan: We will start her on Zoloft 50 mg daily, and refer her for counseling therapy with Geanie Berlin. We'll continue her Remeron, Vyvanse and Adderall as previously prescribed. She will return for followup in 2 months.  Yuan Gann, PA-C

## 2012-02-23 ENCOUNTER — Other Ambulatory Visit: Payer: Self-pay | Admitting: Family Medicine

## 2012-02-26 ENCOUNTER — Other Ambulatory Visit (HOSPITAL_COMMUNITY): Payer: Self-pay | Admitting: Physician Assistant

## 2012-02-26 DIAGNOSIS — F39 Unspecified mood [affective] disorder: Secondary | ICD-10-CM

## 2012-03-26 ENCOUNTER — Other Ambulatory Visit: Payer: Self-pay | Admitting: Family Medicine

## 2012-03-27 ENCOUNTER — Ambulatory Visit (INDEPENDENT_AMBULATORY_CARE_PROVIDER_SITE_OTHER): Payer: Medicare HMO | Admitting: Family Medicine

## 2012-03-27 ENCOUNTER — Encounter: Payer: Self-pay | Admitting: Family Medicine

## 2012-03-27 VITALS — BP 137/77 | HR 96 | Temp 98.1°F | Ht 63.5 in | Wt 155.0 lb

## 2012-03-27 DIAGNOSIS — I10 Essential (primary) hypertension: Secondary | ICD-10-CM

## 2012-03-27 DIAGNOSIS — G2581 Restless legs syndrome: Secondary | ICD-10-CM

## 2012-03-27 DIAGNOSIS — E119 Type 2 diabetes mellitus without complications: Secondary | ICD-10-CM

## 2012-03-27 DIAGNOSIS — Z23 Encounter for immunization: Secondary | ICD-10-CM

## 2012-03-27 DIAGNOSIS — E785 Hyperlipidemia, unspecified: Secondary | ICD-10-CM

## 2012-03-27 MED ORDER — ROPINIROLE HCL 1 MG PO TABS: 2.0000 mg | ORAL_TABLET | Freq: Every day | ORAL | Status: DC

## 2012-03-27 MED ORDER — METFORMIN HCL 500 MG PO TABS: 500.0000 mg | ORAL_TABLET | Freq: Two times a day (BID) | ORAL | Status: DC

## 2012-03-27 MED ORDER — METFORMIN HCL 500 MG PO TABS: 1000.0000 mg | ORAL_TABLET | Freq: Two times a day (BID) | ORAL | Status: DC

## 2012-03-27 MED ORDER — SIMVASTATIN 40 MG PO TABS: 40.0000 mg | ORAL_TABLET | Freq: Every day | ORAL | Status: DC

## 2012-03-27 MED ORDER — ENALAPRIL MALEATE 2.5 MG PO TABS: 2.5000 mg | ORAL_TABLET | Freq: Every day | ORAL | Status: DC

## 2012-03-27 NOTE — Progress Notes (Signed)
  Subjective:    Patient ID: Brittany Chung, female    DOB: 04/25/1941, 71 y.o.   MRN: [id number]  HPI  Brittany Chung comes in for follow up of her chronic medical conditions.  She is overall doing well.    DM: Patient is taking metformin 500 mg PO bid.  Patient is not checking blood sugars.  No hyper or hypoglycemic episodes, no polyuria or polydypisa.  She does admit that she has not been very good with her diet lately.   HTN: Taking enalapril without difficulty.  Denies chest pain, dizziness, palpitations, LE edema.  Patient does not check blood pressures.    Restless legs: Now taking 2 mg of Requip QHS, she says this helps and she is sleeping much better.    HLD: Patient is taking Simvastatin 40 without difficulty.  Last lipid profile was February 2013.  Patient is is not making lifestyle modifications.   Past Medical History  Diagnosis Date  . Irregular heart beat   . Depression   . Stroke 2006  . Cancer     skin  . Carotid stenosis     s/p R CEA 2006  . Cataract     s/p surgery 2007   Family History  Problem Relation Age of Onset  . Cancer Mother     Breast  . Aneurysm Father   . Cancer Sister     lung cancer  . Cancer Daughter     colon cancer   History  Substance Use Topics  . Smoking status: Never Smoker   . Smokeless tobacco: Never Used  . Alcohol Use: No     Review of Systems Negative except stated in HPI    Objective:   Physical Exam BP 137/77  Pulse 96  Temp 98.1 F (36.7 C) (Oral)  Ht 5' 3.5" (1.613 m)  Wt 155 lb (70.308 kg)  BMI 27.03 kg/m2 General appearance: alert, cooperative and no distress Lungs: clear to auscultation bilaterally Heart: regular rate and rhythm, S1, S2 normal, no murmur, click, rub or gallop Extremities: extremities normal, atraumatic, no cyanosis or edema Pulses: 2+ and symmetric       Assessment & Plan:

## 2012-03-27 NOTE — Assessment & Plan Note (Signed)
Improved control on 2 mg of requip, advised to take lowest dose that is therapeutic so as to avoid drowsiness.

## 2012-03-27 NOTE — Assessment & Plan Note (Signed)
Well controlled on enalapril, no symptoms, continue at current dose.

## 2012-03-27 NOTE — Assessment & Plan Note (Signed)
Last profile well controlled, refill simvastatin, f/u with Dr. Madolyn Frieze, lipids will be due in February.

## 2012-03-27 NOTE — Assessment & Plan Note (Signed)
A1C slightly elevated to 8.1 today, will increase metformin to 1000 mg po bid for better control, also counseled about diet control.

## 2012-03-27 NOTE — Patient Instructions (Signed)
It was good to see you today.  Your Hemoglobin A1C is  Lab Results  Component Value Date   HGBA1C 8.1 03/27/2012  .  Remember your goal for A1C is less than 7.  Your goal for fasting morning blood sugar is 80-120.   I want you to increase your metformin, and start taking two pills in the morning and two pills in the evening.  Please start checking your blood sugars in the morning before you eat anything and call the office for an appointment if it is running above 150.    Your blood pressure today was BP: 137/77 mmHg.  Remember your goal blood pressure is about 130/80.  Please be sure to take your medication every day.    Please make an appointment to see Dr. Madolyn Frieze in about 3 months.

## 2012-04-01 ENCOUNTER — Ambulatory Visit (HOSPITAL_COMMUNITY): Payer: Self-pay | Admitting: Licensed Clinical Social Worker

## 2012-04-03 ENCOUNTER — Ambulatory Visit (INDEPENDENT_AMBULATORY_CARE_PROVIDER_SITE_OTHER): Payer: Medicare HMO | Admitting: Physician Assistant

## 2012-04-03 DIAGNOSIS — F39 Unspecified mood [affective] disorder: Secondary | ICD-10-CM

## 2012-04-03 DIAGNOSIS — F988 Other specified behavioral and emotional disorders with onset usually occurring in childhood and adolescence: Secondary | ICD-10-CM

## 2012-04-03 MED ORDER — AMPHETAMINE-DEXTROAMPHETAMINE 20 MG PO TABS: 20.0000 mg | ORAL_TABLET | Freq: Every day | ORAL | Status: DC

## 2012-04-03 MED ORDER — LISDEXAMFETAMINE DIMESYLATE 70 MG PO CAPS: 70.0000 mg | ORAL_CAPSULE | ORAL | Status: DC

## 2012-04-03 MED ORDER — SERTRALINE HCL 100 MG PO TABS: 100.0000 mg | ORAL_TABLET | Freq: Every day | ORAL | Status: DC

## 2012-04-03 NOTE — Progress Notes (Signed)
   [facility] Follow-up Outpatient Visit  [name] 1940-11-24  Date: 04/03/2012   Subjective: Brittany Chung presents today to followup on her treatment for ADHD and depressive symptoms. She reports that she was unable to make her appointment as scheduled with the therapist Geanie Berlin do to an illness. She feels the Zoloft has helped "some." She continues having some trouble getting up and out. She has been trying to increase her level of activity, and had been going to the park to walk 2-3 times per week. She reports that her energy is good now. She reports that her appetite is good and she eats well. She denies any auditory or visual hallucinations. She denies any suicidal or homicidal ideation.  There were no vitals filed for this visit.  Mental Status Examination  Appearance: Well groomed and nicely dressed Alert: Yes Attention: good  Cooperative: Yes Eye Contact: Good Speech: Clear and coherent Psychomotor Activity: Normal Memory/Concentration: Intact Oriented: person, place, time/date and situation Mood: Euthymic Affect: Appropriate Thought Processes and Associations: Linear Fund of Knowledge: Good Thought Content: Normal Insight: Good Judgement: Good  Diagnosis: ADHD, inattentive type; mood disorder not otherwise specified  Treatment Plan: We will increase her Zoloft to 100 mg, continue her Vyvanse 70 mg daily, and Adderall 20 mg daily, as well as Remeron 15 mg at bedtime. She will return for followup in 3 months.  Tajha Sammarco, PA-C

## 2012-04-07 ENCOUNTER — Ambulatory Visit (INDEPENDENT_AMBULATORY_CARE_PROVIDER_SITE_OTHER): Payer: Medicare HMO | Admitting: Family Medicine

## 2012-04-07 ENCOUNTER — Encounter: Payer: Self-pay | Admitting: Family Medicine

## 2012-04-07 VITALS — BP 130/74 | HR 105 | Temp 98.4°F | Ht 63.5 in | Wt 154.0 lb

## 2012-04-07 DIAGNOSIS — J069 Acute upper respiratory infection, unspecified: Secondary | ICD-10-CM

## 2012-04-07 NOTE — Patient Instructions (Signed)
I am sorry you do not feel good.  You have viral upper respiratory infection (or a cold).  Please get lots of rest and drink a lot of fluids.  You can take robitussin or a cold and flu medication.  If you develop high fevers, trouble breathing, or are coughing up green phlegm, please call the office for another visit.

## 2012-04-07 NOTE — Progress Notes (Signed)
Subjective:     Patient ID: Brittany Chung, female   DOB: 1940-08-07, 71 y.o.   MRN: [id number]  HPI Brittany Chung is a 71 yo female who presents with hoarseness, cough, and muscle soreness since yesterday. She started feeling like she was getting sick on Saturday, in which she began to notice increasing lethargy and diaphoresis. She denies any fevers. Admits to sick contacts, as she lives with her daughter and her daughter's 5 children, who have been sick recently. Denies any difficulty breathing or shortness of breath but does admit to soreness in her chest when she breaths.   Review of Systems See HPI above.    Objective:   Physical Exam BP 130/74  Pulse 105  Temp 98.4 F (36.9 C) (Oral)  Ht 5' 3.5" (1.613 m)  Wt 154 lb (69.854 kg)  BMI 26.85 kg/m2  SpO2 96% General appearance: alert, cooperative and no distress Head: Normocephalic, without obvious abnormality, atraumatic, sinuses nontender to percussion Ears: normal TM and external ear canal right ear and normal TM in left ear;  however note a bright blue plastic bead sitting in the left ear canal Nose: Nares normal. Septum midline. Mucosa normal. No drainage or sinus tenderness. Throat: lips, mucosa, and tongue normal; teeth and gums normal Neck: no adenopathy, no carotid bruit, no JVD, supple, symmetrical, trachea midline and thyroid not enlarged, symmetric, no tenderness/mass/nodules Lungs: clear to auscultation bilaterally Heart: regular rate and rhythm, S1, S2 normal, no murmur, click, rub or gallop    Assessment:     Viral URI Foreign body- left ear canal    Plan:     Instructed pt to rest and drink plenty of fluids. Suggested OTC medications for symptomatic control, such as Robitussin or cold & flu OTC medications.  Attempted to remove bead from left ear with some difficulty. Instructed the patient to try to flush her ear out with hydrogen peroxide and water and see if she is able to remove the bead that way.  Otherwise, we can refer her to ENT in the future if she is unsuccessful and it is bothering her.  Pt instructed to call office to schedule another visit if she begins getting high fevers, she is coughing up copious sputum, or she is having shortness of breath.

## 2012-04-07 NOTE — Assessment & Plan Note (Signed)
No red flags on exam or history today, advised supportive care and reviewed reasons to return to care.

## 2012-04-07 NOTE — Progress Notes (Signed)
PGY3 Resident Addendum to Student Note:   S: Brittany Chung comes in with 3 days or URI symptoms, she did get a flu shot this year.   O:  BP 130/74  Pulse 105  Temp 98.4 F (36.9 C) (Oral)  Ht 5' 3.5" (1.613 m)  Wt 154 lb (69.854 kg)  BMI 26.85 kg/m2  SpO2 96% General appearance: alert, cooperative and no distress Eyes: conjunctivae/corneas clear. PERRL, EOM's intact. Fundi benign. Ears: normal TM and external ear canal left ear and Normal TM right ear, but there is a small blue bead in left ear canal, unable to remove with curvette. Nose: clear discharge Throat: lips, mucosa, and tongue normal; teeth and gums normal Neck: no adenopathy and supple, symmetrical, trachea midline Lungs: clear to auscultation bilaterally Heart: regular rate and rhythm, S1, S2 normal, no murmur, click, rub or gallop

## 2012-05-12 ENCOUNTER — Other Ambulatory Visit: Payer: Self-pay | Admitting: Family Medicine

## 2012-06-05 ENCOUNTER — Other Ambulatory Visit (HOSPITAL_COMMUNITY): Payer: Self-pay | Admitting: Physician Assistant

## 2012-06-24 ENCOUNTER — Other Ambulatory Visit (HOSPITAL_COMMUNITY): Payer: Self-pay | Admitting: Physician Assistant

## 2012-07-07 ENCOUNTER — Ambulatory Visit (HOSPITAL_COMMUNITY): Payer: Self-pay | Admitting: Physician Assistant

## 2012-07-07 ENCOUNTER — Other Ambulatory Visit (HOSPITAL_COMMUNITY): Payer: Self-pay | Admitting: Physician Assistant

## 2012-07-07 DIAGNOSIS — F988 Other specified behavioral and emotional disorders with onset usually occurring in childhood and adolescence: Secondary | ICD-10-CM

## 2012-07-07 MED ORDER — AMPHETAMINE-DEXTROAMPHETAMINE 20 MG PO TABS: 20.0000 mg | ORAL_TABLET | Freq: Every day | ORAL | Status: DC

## 2012-07-07 MED ORDER — LISDEXAMFETAMINE DIMESYLATE 70 MG PO CAPS: 70.0000 mg | ORAL_CAPSULE | ORAL | Status: DC

## 2012-07-08 ENCOUNTER — Emergency Department (HOSPITAL_COMMUNITY): Payer: Medicare HMO

## 2012-07-08 ENCOUNTER — Emergency Department (HOSPITAL_COMMUNITY)
Admission: EM | Admit: 2012-07-08 | Discharge: 2012-07-08 | Disposition: A | Discharge status: Home / Self Care | Payer: Medicare HMO | Attending: Emergency Medicine | Admitting: Emergency Medicine

## 2012-07-08 ENCOUNTER — Ambulatory Visit (INDEPENDENT_AMBULATORY_CARE_PROVIDER_SITE_OTHER): Payer: Medicare HMO | Admitting: Physician Assistant

## 2012-07-08 DIAGNOSIS — F909 Attention-deficit hyperactivity disorder, unspecified type: Secondary | ICD-10-CM

## 2012-07-08 DIAGNOSIS — S6990XA Unspecified injury of unspecified wrist, hand and finger(s), initial encounter: Secondary | ICD-10-CM | POA: Insufficient documentation

## 2012-07-08 DIAGNOSIS — Z8673 Personal history of transient ischemic attack (TIA), and cerebral infarction without residual deficits: Secondary | ICD-10-CM | POA: Insufficient documentation

## 2012-07-08 DIAGNOSIS — Z7982 Long term (current) use of aspirin: Secondary | ICD-10-CM | POA: Insufficient documentation

## 2012-07-08 DIAGNOSIS — IMO0002 Reserved for concepts with insufficient information to code with codable children: Secondary | ICD-10-CM | POA: Insufficient documentation

## 2012-07-08 DIAGNOSIS — S060X1A Concussion with loss of consciousness of 30 minutes or less, initial encounter: Secondary | ICD-10-CM | POA: Insufficient documentation

## 2012-07-08 DIAGNOSIS — Y9389 Activity, other specified: Secondary | ICD-10-CM | POA: Insufficient documentation

## 2012-07-08 DIAGNOSIS — Y92410 Unspecified street and highway as the place of occurrence of the external cause: Secondary | ICD-10-CM | POA: Insufficient documentation

## 2012-07-08 DIAGNOSIS — Z9849 Cataract extraction status, unspecified eye: Secondary | ICD-10-CM | POA: Insufficient documentation

## 2012-07-08 DIAGNOSIS — F319 Bipolar disorder, unspecified: Secondary | ICD-10-CM

## 2012-07-08 DIAGNOSIS — S01501A Unspecified open wound of lip, initial encounter: Secondary | ICD-10-CM | POA: Insufficient documentation

## 2012-07-08 DIAGNOSIS — Z8679 Personal history of other diseases of the circulatory system: Secondary | ICD-10-CM | POA: Insufficient documentation

## 2012-07-08 DIAGNOSIS — S0081XA Abrasion of other part of head, initial encounter: Secondary | ICD-10-CM

## 2012-07-08 DIAGNOSIS — F329 Major depressive disorder, single episode, unspecified: Secondary | ICD-10-CM | POA: Insufficient documentation

## 2012-07-08 DIAGNOSIS — Z85828 Personal history of other malignant neoplasm of skin: Secondary | ICD-10-CM | POA: Insufficient documentation

## 2012-07-08 DIAGNOSIS — F988 Other specified behavioral and emotional disorders with onset usually occurring in childhood and adolescence: Secondary | ICD-10-CM

## 2012-07-08 DIAGNOSIS — Z79899 Other long term (current) drug therapy: Secondary | ICD-10-CM | POA: Insufficient documentation

## 2012-07-08 DIAGNOSIS — S59909A Unspecified injury of unspecified elbow, initial encounter: Secondary | ICD-10-CM | POA: Insufficient documentation

## 2012-07-08 DIAGNOSIS — R0789 Other chest pain: Secondary | ICD-10-CM | POA: Insufficient documentation

## 2012-07-08 DIAGNOSIS — S01511A Laceration without foreign body of lip, initial encounter: Secondary | ICD-10-CM

## 2012-07-08 DIAGNOSIS — F3289 Other specified depressive episodes: Secondary | ICD-10-CM | POA: Insufficient documentation

## 2012-07-08 DIAGNOSIS — T07XXXA Unspecified multiple injuries, initial encounter: Secondary | ICD-10-CM | POA: Insufficient documentation

## 2012-07-08 DIAGNOSIS — S0990XA Unspecified injury of head, initial encounter: Secondary | ICD-10-CM

## 2012-07-08 LAB — COMPREHENSIVE METABOLIC PANEL
ALT: 28 U/L (ref 0–35)
Alkaline Phosphatase: 96 U/L (ref 39–117)
CO2: 25 mEq/L (ref 19–32)
Calcium: 9.9 mg/dL (ref 8.4–10.5)
Chloride: 99 mEq/L (ref 96–112)
GFR calc Af Amer: >90 mL/min (ref >90)
GFR calc non Af Amer: 81 mL/min — ABNORMAL LOW (ref >90)
Glucose, Bld: 221 mg/dL — ABNORMAL HIGH (ref 70–99)
Sodium: 138 mEq/L (ref 135–145)
Total Bilirubin: 0.8 mg/dL (ref 0.3–1.2)

## 2012-07-08 LAB — CBC WITH DIFFERENTIAL/PLATELET
Eosinophils Relative: 1 % (ref 0–5)
HCT: 40.5 % (ref 36.0–46.0)
Lymphocytes Relative: 23 % (ref 12–46)
Lymphs Abs: 3.4 10*3/uL (ref 0.7–4.0)
MCV: 87.3 fL (ref 78.0–100.0)
Platelets: 236 10*3/uL (ref 150–400)
RBC: 4.64 MIL/uL (ref 3.87–5.11)
WBC: 15 10*3/uL — ABNORMAL HIGH (ref 4.0–10.5)

## 2012-07-08 LAB — APTT: aPTT: 28 seconds (ref 24–37)

## 2012-07-08 MED ORDER — METHOCARBAMOL 500 MG PO TABS: 500.0000 mg | ORAL_TABLET | Freq: Two times a day (BID) | ORAL | Status: DC

## 2012-07-08 MED ORDER — TRAMADOL HCL 50 MG PO TABS: 50.0000 mg | ORAL_TABLET | Freq: Four times a day (QID) | ORAL | Status: DC | PRN

## 2012-07-08 MED ORDER — ACETAMINOPHEN 325 MG PO TABS
650.0000 mg | ORAL_TABLET | Freq: Once | ORAL | Status: AC
  Administered 2012-07-08: 650 mg via ORAL

## 2012-07-08 MED ORDER — LIDOCAINE-EPINEPHRINE (PF) 2 %-1:200000 IJ SOLN: 10.0000 mL | Freq: Once | INTRAMUSCULAR | Status: DC

## 2012-07-08 MED ORDER — ACETAMINOPHEN 325 MG PO TABS
ORAL_TABLET | ORAL | Status: AC
  Filled 2012-07-08: qty 2

## 2012-07-08 NOTE — Progress Notes (Signed)
   [facility] Follow-up Outpatient Visit  JAIDAN STACHNIK 1940-09-04  Date: 07/08/2012   Subjective: Brittany Chung presents today to followup on her treatment for ADHD and depression. Her major concern today is that her insurance company, [place], wants her to change her Vyvanse and Adderall to other medications do to her age and risk for cardiac events. We reviewed her cardiac risk, and she reports that she had a carotid endarterectomy about 6 years ago, but she denies any arrhythmia or heart valve disease. She does have a benign essential hypertension that is controlled. She understands the risks with these medications, and she is willing to assume them as the benefits outweigh the risks.  She requests that we petition the insurance company to override their ruling concerning a stimulant medications.  She reports that her depression is ongoing, and rates it as an 8 or 9 on a scale of 1-10 where 10 is the worst. She cannot tell that the increase in the Zoloft dose has made much if any difference. She denies any suicidal or homicidal ideation. She denies any auditory or visual hallucinations. She reports that she is sleeping well with the Remeron. She endorses good appetite. She states that she is lonely, but she is not willing to try to meet people on the Internet, as many of her friends are doing.  There were no vitals filed for this visit.  Mental Status Examination  Appearance: Well groomed and nicely dressed Alert: Yes Attention: good  Cooperative: Yes Eye Contact: Good Speech: Clear and coherent Psychomotor Activity: Normal Memory/Concentration: Intact Oriented: person, place, time/date and situation Mood: Euthymic Affect: Appropriate Thought Processes and Associations: Linear Fund of Knowledge: Good Thought Content: Normal Insight: Good Judgement: Good  Diagnosis: ADHD, inattentive type; major bipolar disorder, currently stable Treatment Plan: We will make no changes to  her medications at this time, and we will petition her insurance company to be able to continue the Vyvanse at 70 mg daily.  Eltha Tingley, PA-C

## 2012-07-08 NOTE — ED Provider Notes (Signed)
Medical screening examination/treatment/procedure(s) were performed by non-physician practitioner and as supervising physician I was immediately available for consultation/collaboration.   [name], MD 07/08/12 [phone]
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
## 2012-07-09 ENCOUNTER — Telehealth (HOSPITAL_COMMUNITY): Payer: Self-pay

## 2012-07-14 ENCOUNTER — Telehealth (HOSPITAL_COMMUNITY): Payer: Self-pay | Admitting: *Deleted

## 2012-07-14 NOTE — Telephone Encounter (Signed)
Let her know request had been sent to insurance company and would notify her when approved.

## 2012-07-15 ENCOUNTER — Encounter: Payer: Self-pay | Admitting: Family Medicine

## 2012-07-15 ENCOUNTER — Encounter (HOSPITAL_COMMUNITY): Payer: Self-pay | Admitting: *Deleted

## 2012-07-15 ENCOUNTER — Ambulatory Visit (INDEPENDENT_AMBULATORY_CARE_PROVIDER_SITE_OTHER): Payer: Medicare HMO | Admitting: Family Medicine

## 2012-07-15 VITALS — BP 128/72 | HR 85 | Temp 98.7°F | Ht 63.5 in | Wt 150.0 lb

## 2012-07-15 DIAGNOSIS — L0291 Cutaneous abscess, unspecified: Secondary | ICD-10-CM

## 2012-07-15 DIAGNOSIS — L0390 Cellulitis, unspecified: Secondary | ICD-10-CM

## 2012-07-15 DIAGNOSIS — E119 Type 2 diabetes mellitus without complications: Secondary | ICD-10-CM

## 2012-07-15 DIAGNOSIS — H9190 Unspecified hearing loss, unspecified ear: Secondary | ICD-10-CM

## 2012-07-15 MED ORDER — DOXYCYCLINE HYCLATE 100 MG PO TABS: 100.0000 mg | ORAL_TABLET | Freq: Every day | ORAL | Status: DC
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
## 2012-07-15 NOTE — Progress Notes (Signed)
Vyvanse authorized by [facility] from 2/11/4 until 06/03/13. Patient and pharmacy notified

## 2012-07-15 NOTE — Patient Instructions (Signed)
We will refer you to ENT to get bead removed out of your left ear  Take antibiotic for skin infection If you develop any reaction such as a new rash, difficulty breathing, stop medication and call and let me know  Follow-up in 2-4 weeks to talk about your diabetes

## 2012-07-16 DIAGNOSIS — L0390 Cellulitis, unspecified: Secondary | ICD-10-CM | POA: Insufficient documentation
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
## 2012-08-06 ENCOUNTER — Telehealth (HOSPITAL_COMMUNITY): Payer: Self-pay | Admitting: *Deleted

## 2012-08-06 NOTE — Telephone Encounter (Signed)
See phone note

## 2012-08-07 ENCOUNTER — Telehealth (HOSPITAL_COMMUNITY): Payer: Self-pay | Admitting: *Deleted

## 2012-08-07 ENCOUNTER — Other Ambulatory Visit (HOSPITAL_COMMUNITY): Payer: Self-pay | Admitting: *Deleted

## 2012-08-07 DIAGNOSIS — F988 Other specified behavioral and emotional disorders with onset usually occurring in childhood and adolescence: Secondary | ICD-10-CM

## 2012-08-07 MED ORDER — AMPHETAMINE-DEXTROAMPHETAMINE 20 MG PO TABS
20.0000 mg | ORAL_TABLET | Freq: Every day | ORAL | Status: DC
Start: 2012-08-07 — End: 2012-09-08

## 2012-08-07 MED ORDER — LISDEXAMFETAMINE DIMESYLATE 70 MG PO CAPS: 70.0000 mg | ORAL_CAPSULE | ORAL | Status: DC

## 2012-08-07 NOTE — Telephone Encounter (Signed)
See phone note

## 2012-08-09 ENCOUNTER — Other Ambulatory Visit (HOSPITAL_COMMUNITY): Payer: Self-pay | Admitting: Physician Assistant

## 2012-09-03 ENCOUNTER — Other Ambulatory Visit: Payer: Self-pay | Admitting: *Deleted

## 2012-09-03 MED ORDER — SIMVASTATIN 40 MG PO TABS: 40.0000 mg | ORAL_TABLET | Freq: Every day | ORAL | Status: DC

## 2012-09-08 ENCOUNTER — Ambulatory Visit (INDEPENDENT_AMBULATORY_CARE_PROVIDER_SITE_OTHER): Payer: Medicare HMO | Admitting: Physician Assistant

## 2012-09-08 DIAGNOSIS — F988 Other specified behavioral and emotional disorders with onset usually occurring in childhood and adolescence: Secondary | ICD-10-CM

## 2012-09-08 DIAGNOSIS — F330 Major depressive disorder, recurrent, mild: Secondary | ICD-10-CM

## 2012-09-08 MED ORDER — LISDEXAMFETAMINE DIMESYLATE 70 MG PO CAPS: 70.0000 mg | ORAL_CAPSULE | ORAL | Status: DC

## 2012-09-08 MED ORDER — AMPHETAMINE-DEXTROAMPHETAMINE 20 MG PO TABS: 20.0000 mg | ORAL_TABLET | Freq: Every day | ORAL | Status: DC

## 2012-09-08 NOTE — Progress Notes (Signed)
   [facility] Health Follow-up Outpatient Visit  Brittany Chung 1941-03-08  Date: 09/08/2012   Subjective: Brittany Chung presents today to followup on her treatment for ADHD and depression. She reports that she is having to take care of 5 of her grandchildren for the weekend because her daughter has taken a trip out to [facility]. Otherwise she feels she is doing very well. She denies that she is having any depression, and reports that helping other people helps to eliminate her depression. She reports that she takes the Vyvanse around 6:30 AM and it wears off around 1:30 PM. She then takes Adderall around 2 or 3 in the afternoon. Her sleep and appetite are good. She denies any suicidal or homicidal ideation. She denies any auditory or visual hallucinations  There were no vitals filed for this visit.  Mental Status Examination  Appearance: Well groomed and nicely dressed Alert: Yes Attention: good  Cooperative: Yes Eye Contact: Good Speech: Clear and coherent Psychomotor Activity: Normal Memory/Concentration: Intact Oriented: person, place, time/date and situation Mood: Euthymic Affect: Appropriate Thought Processes and Associations: Circumstantial Fund of Knowledge: Good Thought Content: Normal Insight: Good Judgement: Good  Diagnosis: ADHD, inattentive type; major depressive disorder, recurrent, mild  Treatment Plan: We will continue her Vyvanse 70 mg each morning, and Adderall 20 mg each afternoon. We will also continue her Zoloft 100 mg daily, and Remeron 15 mg at bedtime. She will return for followup in 3 months.  Saw [place], PA-C

## 2012-09-13 ENCOUNTER — Other Ambulatory Visit (HOSPITAL_COMMUNITY): Payer: Self-pay | Admitting: Physician Assistant

## 2012-09-27 ENCOUNTER — Other Ambulatory Visit: Payer: Self-pay | Admitting: Family Medicine

## 2012-10-22 ENCOUNTER — Encounter: Payer: Self-pay | Admitting: Home Health Services

## 2012-12-08 ENCOUNTER — Ambulatory Visit (INDEPENDENT_AMBULATORY_CARE_PROVIDER_SITE_OTHER): Payer: Federal, State, Local not specified - Other | Admitting: Physician Assistant

## 2012-12-08 VITALS — BP 113/74 | HR 86 | Ht 63.5 in | Wt 152.8 lb

## 2012-12-08 DIAGNOSIS — F331 Major depressive disorder, recurrent, moderate: Secondary | ICD-10-CM

## 2012-12-08 DIAGNOSIS — F988 Other specified behavioral and emotional disorders with onset usually occurring in childhood and adolescence: Secondary | ICD-10-CM

## 2012-12-08 MED ORDER — AMPHETAMINE-DEXTROAMPHETAMINE 20 MG PO TABS: 20.0000 mg | ORAL_TABLET | Freq: Every day | ORAL | Status: DC

## 2012-12-08 MED ORDER — LISDEXAMFETAMINE DIMESYLATE 70 MG PO CAPS: 70.0000 mg | ORAL_CAPSULE | ORAL | Status: DC

## 2012-12-10 ENCOUNTER — Encounter (HOSPITAL_COMMUNITY): Payer: Self-pay | Admitting: Physician Assistant

## 2012-12-10 NOTE — Progress Notes (Signed)
   [facility] Behavioral Health Follow-up Outpatient Visit  Brittany Chung 1940-09-25  Date: 12/08/2012   Subjective: Brittany Chung presents today to followup on her treatment for anxiety, depression, and ADHD. She endorses experiencing anxiety and depression, but states that it is situational. She initially reports that her mood is stable, but then she rates her depression as a 5 on a scale of 1-10, where 10 is the worst. She rates her anxiety as a 9. She reports that she may lose her house because she has not been paying her taxes and insurance. She uses the money from her reverse mortgage to fix her house up, and now she is unable to pay those bills. She endorses good sleep and appetite. She denies any suicidal or homicidal ideation. She denies any auditory or visual hallucinations.  Filed Vitals:   12/08/12 1338  BP: 113/74  Pulse: 86    Mental Status Examination  Appearance: Well groomed and casually dressed Alert: Yes Attention: good  Cooperative: Yes Eye Contact: Good Speech: Clear and coherent Psychomotor Activity: Normal Memory/Concentration: Intact Oriented: person, place, time/date and situation Mood: Euthymic Affect: Congruent Thought Processes and Associations: Tangential Fund of Knowledge: Fair Thought Content: Normal Insight: Fair Judgement: Fair  Diagnosis: ADHD, inattentive type; major depressive disorder, recurrent, moderate  Treatment Plan: We will continue her Vyvanse 70 mg each morning, and Adderall 20 mg each afternoon. We will also continue her Zoloft 100 mg daily, and Remeron 15 mg at bedtime. She will return for followup in 3 months.   Cartina Brousseau, PA-C

## 2012-12-11 ENCOUNTER — Other Ambulatory Visit: Payer: Self-pay

## 2013-01-30 ENCOUNTER — Other Ambulatory Visit (HOSPITAL_COMMUNITY): Payer: Self-pay | Admitting: Physician Assistant

## 2013-03-06 ENCOUNTER — Other Ambulatory Visit (HOSPITAL_COMMUNITY): Payer: Self-pay | Admitting: Physician Assistant

## 2013-03-06 ENCOUNTER — Telehealth (HOSPITAL_COMMUNITY): Payer: Self-pay

## 2013-03-06 DIAGNOSIS — F988 Other specified behavioral and emotional disorders with onset usually occurring in childhood and adolescence: Secondary | ICD-10-CM

## 2013-03-06 MED ORDER — AMPHETAMINE-DEXTROAMPHETAMINE 20 MG PO TABS: 20.0000 mg | ORAL_TABLET | Freq: Every day | ORAL | Status: DC

## 2013-03-06 MED ORDER — LISDEXAMFETAMINE DIMESYLATE 70 MG PO CAPS: 70.0000 mg | ORAL_CAPSULE | ORAL | Status: DC

## 2013-03-06 NOTE — Telephone Encounter (Signed)
03/06/13 4:24pm Patient pick -up rx script for Vyvanse.Marland KitchenMarguerite Olea

## 2013-03-10 ENCOUNTER — Ambulatory Visit (HOSPITAL_COMMUNITY): Payer: Self-pay | Admitting: Physician Assistant

## 2013-03-21 ENCOUNTER — Other Ambulatory Visit (HOSPITAL_COMMUNITY): Payer: Self-pay | Admitting: Physician Assistant

## 2013-03-23 ENCOUNTER — Telehealth: Payer: Self-pay | Admitting: Family Medicine

## 2013-03-23 MED ORDER — ROPINIROLE HCL 1 MG PO TABS: ORAL_TABLET | ORAL | Status: DC

## 2013-03-23 NOTE — Telephone Encounter (Signed)
Pt would like a refill on her requip sent to her pharmacy. She has an appointment for 10/27. JW

## 2013-03-23 NOTE — Telephone Encounter (Signed)
Will forward to MD. Jet Traynham,CMA  

## 2013-03-30 ENCOUNTER — Ambulatory Visit (INDEPENDENT_AMBULATORY_CARE_PROVIDER_SITE_OTHER): Payer: Medicare HMO | Admitting: Family Medicine

## 2013-03-30 ENCOUNTER — Encounter: Payer: Self-pay | Admitting: Family Medicine

## 2013-03-30 VITALS — BP 110/60 | HR 72 | Temp 97.8°F | Ht 63.5 in | Wt 158.0 lb

## 2013-03-30 DIAGNOSIS — Z23 Encounter for immunization: Secondary | ICD-10-CM

## 2013-03-30 DIAGNOSIS — G2581 Restless legs syndrome: Secondary | ICD-10-CM

## 2013-03-30 DIAGNOSIS — H409 Unspecified glaucoma: Secondary | ICD-10-CM

## 2013-03-30 DIAGNOSIS — H402224 Chronic angle-closure glaucoma, left eye, indeterminate stage: Secondary | ICD-10-CM

## 2013-03-30 DIAGNOSIS — I10 Essential (primary) hypertension: Secondary | ICD-10-CM

## 2013-03-30 DIAGNOSIS — E119 Type 2 diabetes mellitus without complications: Secondary | ICD-10-CM

## 2013-03-30 DIAGNOSIS — E785 Hyperlipidemia, unspecified: Secondary | ICD-10-CM

## 2013-03-30 DIAGNOSIS — H402290 Chronic angle-closure glaucoma, unspecified eye, stage unspecified: Secondary | ICD-10-CM

## 2013-03-30 LAB — COMPREHENSIVE METABOLIC PANEL
ALT: 25 U/L (ref 0–35)
AST: 28 U/L (ref 0–37)
Albumin: 4.5 g/dL (ref 3.5–5.2)
Alkaline Phosphatase: 61 U/L (ref 39–117)
BUN: 20 mg/dL (ref 6–23)
Creat: 0.94 mg/dL (ref 0.50–1.10)
Potassium: 4.4 mEq/L (ref 3.5–5.3)
Total Bilirubin: 0.9 mg/dL (ref 0.3–1.2)

## 2013-03-30 LAB — LIPID PANEL
Cholesterol: 196 mg/dL (ref 0–200)
HDL: 62 mg/dL (ref >39)
Total CHOL/HDL Ratio: 3.2 Ratio
Triglycerides: 150 mg/dL — ABNORMAL HIGH (ref <150)
VLDL: 30 mg/dL (ref 0–40)

## 2013-03-30 MED ORDER — ENALAPRIL MALEATE 2.5 MG PO TABS: 2.5000 mg | ORAL_TABLET | Freq: Every day | ORAL | Status: DC

## 2013-03-30 MED ORDER — METFORMIN HCL 850 MG PO TABS: 850.0000 mg | ORAL_TABLET | Freq: Two times a day (BID) | ORAL | Status: DC

## 2013-03-30 MED ORDER — ROPINIROLE HCL 1 MG PO TABS: ORAL_TABLET | ORAL | Status: DC

## 2013-03-30 NOTE — Patient Instructions (Signed)
For your diabetes (your a1c 8.7 and you set a goal of 7's)  Exercise 3x a week  Cut out the Junk  Increase metformin dose to 850 mg  Follow up in 3 months  Please have your eye doctor send [place] records.   For your labs, I will send you a letter if there are no medication changes needed. I will call you if we need to discuss your lab results.   You got your flu shot.   Great to meet you and see you in 3 months,  Dr. Durene Cal

## 2013-04-01 ENCOUNTER — Encounter: Payer: Self-pay | Admitting: Family Medicine

## 2013-04-01 ENCOUNTER — Ambulatory Visit (HOSPITAL_COMMUNITY): Payer: Commercial Managed Care - HMO | Admitting: Physician Assistant

## 2013-04-01 VITALS — BP 132/70 | HR 88 | Ht 63.5 in | Wt 158.2 lb

## 2013-04-01 DIAGNOSIS — Z9889 Other specified postprocedural states: Secondary | ICD-10-CM | POA: Insufficient documentation

## 2013-04-01 DIAGNOSIS — F988 Other specified behavioral and emotional disorders with onset usually occurring in childhood and adolescence: Secondary | ICD-10-CM

## 2013-04-01 MED ORDER — LISDEXAMFETAMINE DIMESYLATE 70 MG PO CAPS: 70.0000 mg | ORAL_CAPSULE | ORAL | Status: DC

## 2013-04-01 MED ORDER — AMPHETAMINE-DEXTROAMPHETAMINE 20 MG PO TABS: 20.0000 mg | ORAL_TABLET | Freq: Every day | ORAL | Status: DC

## 2013-04-01 NOTE — Assessment & Plan Note (Signed)
To see optho tomorrow. Continue lumigan.

## 2013-04-01 NOTE — Assessment & Plan Note (Signed)
Well controlled. Continue current meds: enalapril.

## 2013-04-01 NOTE — Progress Notes (Signed)
[facility] Tana Conch, MD Phone: [phone]  Subjective:  Chief complaint-noted  # DIABETES Type II -patient admits to recent backsliding  Medications taking and tolerating-yes, metformin 500 BID Blood Sugars per patient-does not check regularly Diet-has been eating a large amount of sweets on a regular basis until 3 weeks ago as people from church were dropping these off because they thought it would benefit patient Regular Exercise-none  Health Maintenance Due  Topic Date Due  . Ophthalmology Exam -to see Dr. Elmer Picker 02/10/1949  . Foot Exam -to complete today  07/12/2012  On Aspirin-yes On statin-yes, medium potency Daily foot monitoring-yes  ROS- Denies Polyuria,Polydipsia, nocturia, Vision changes, feet or hand numbness/pain/tingling. Denies  Hypoglycemia symptoms (shaky, sweaty, hungry, weak anxious, tremor, palpitations, confusion, behavior change).   Hemoglobin a1c:  Lab Results  Component Value Date   HGBA1C 8.7 03/30/2013   HGBA1C 8.0 07/15/2012   HGBA1C 8.1 03/27/2012   # Hypertension/hyperlipidemia BP Readings from Last 3 Encounters:  03/30/13 110/60  12/08/12 113/74  07/15/12 128/72  Home BP monitoring-no Compliant with medications-yes without side effects, enalapril and simvastatin.  Denies any chest pain or shortness of breath. No lightheadedness.   # Restless legs Patient states no complaints when taking requip. Formerly had some cramping on this medication.   Past Medical History Patient Active Problem List   Diagnosis Date Noted  . S/P carotid endarterectomy 04/01/2013    Priority: High  . DIABETES-TYPE 2 10/01/2006    Priority: High  . BIPOLAR DISORDER UNSPECIFIED 06/22/2009    Priority: Medium  . HYPERLIPIDEMIA 10/01/2006    Priority: Medium  . HYPERTENSION, BENIGN ESSENTIAL 10/01/2006    Priority: Medium  . Restless leg syndrome 01/18/2012    Priority: Low  . Vitamin D deficiency 01/18/2012    Priority: Low  .  Chronic narrow angle glaucoma 10/12/2011    Priority: Low  . Right knee pain 09/20/2011    Priority: Low  . Counseling regarding advanced directives 08/18/2010    Priority: Low  . ADHD 03/16/2010    Priority: Low  . HERPES, GENITAL NOS 02/24/2007    Priority: Low  . Irritable bowel syndrome 09/19/2006    Priority: Low   Reviewed problem list.  Medications- reviewed and updated Current Outpatient Prescriptions on File Prior to Visit  Medication Sig Dispense Refill  . amphetamine-dextroamphetamine (ADDERALL) 20 MG tablet Take 1 tablet (20 mg total) by mouth daily.  30 tablet  0  . amphetamine-dextroamphetamine (ADDERALL) 20 MG tablet Take 1 tablet (20 mg total) by mouth daily.  30 tablet  0  . amphetamine-dextroamphetamine (ADDERALL) 20 MG tablet Take 1 tablet (20 mg total) by mouth daily.  30 tablet  0  . aspirin 81 MG chewable tablet Chew 81 mg by mouth every morning.       . bimatoprost (LUMIGAN) 0.03 % ophthalmic drops Place 1 drop into both eyes at bedtime.        Marland Kitchen lisdexamfetamine (VYVANSE) 70 MG capsule Take 1 capsule (70 mg total) by mouth every morning.  30 capsule  0  . lisdexamfetamine (VYVANSE) 70 MG capsule Take 1 capsule (70 mg total) by mouth every morning.  30 capsule  0  . lisdexamfetamine (VYVANSE) 70 MG capsule Take 1 capsule (70 mg total) by mouth every morning.  30 capsule  0  . mirtazapine (REMERON) 15 MG tablet TAKE 1 TABLET BY MOUTH AT BEDTIME  30 tablet  5  . Multiple Vitamins-Minerals (MULTIVITAMIN WITH MINERALS) tablet Take 1 tablet  by mouth every evening.       . sertraline (ZOLOFT) 100 MG tablet TAKE 1 TABLET EVERY DAY  30 tablet  3  . simvastatin (ZOCOR) 40 MG tablet Take 1 tablet (40 mg total) by mouth at bedtime.  90 tablet  3  . [DISCONTINUED] rOPINIRole (REQUIP) 1 MG tablet Take 1 tablet (1 mg total) by mouth at bedtime.  30 tablet  3   No current facility-administered medications on file prior to visit.    Objective: BP 110/60  Pulse 72   Temp(Src) 97.8 F (36.6 C) (Oral)  Ht 5' 3.5" (1.613 m)  Wt 158 lb (71.668 kg)  BMI 27.55 kg/m2 Gen: NAD, resting comfortably CV: RRR no murmurs rubs or gallops Lungs: CTAB no crackles, wheeze, rhonchi Skin: warm, dry Neuro: grossly normal, moves all extremities DM foot exam: no deformites, ulcerations. Intact sensation to monofilament testing. 2+ DP and PT pulses.   Assessment/Plan:  Of note, psychiatric illness (bipolar and ADHD) are followed by psychiatry and patietn receives zoloft, remeron, and adderral and lisdexamfetamine through psychiatry.

## 2013-04-01 NOTE — Assessment & Plan Note (Signed)
Refilled requp. This is working well for the patient.

## 2013-04-01 NOTE — Assessment & Plan Note (Signed)
LDL incrased from previous but given dietary indiscretions and not fasting, will not alter therapy at this time. Believe <110 not fasting is reasonable.

## 2013-04-01 NOTE — Assessment & Plan Note (Signed)
Poorly controlled. Patient admits to dietary indiscretions. Will increase metformin from 500 to 850mg  and patient to start exercise program and cut back on sweets intake. Follow up in 3 months.

## 2013-07-06 ENCOUNTER — Ambulatory Visit (INDEPENDENT_AMBULATORY_CARE_PROVIDER_SITE_OTHER): Payer: Medicare HMO | Admitting: Psychiatry

## 2013-07-06 ENCOUNTER — Encounter (HOSPITAL_COMMUNITY): Payer: Self-pay | Admitting: Psychiatry

## 2013-07-06 VITALS — BP 146/60 | HR 88 | Ht 63.5 in | Wt 155.8 lb

## 2013-07-06 DIAGNOSIS — F3289 Other specified depressive episodes: Secondary | ICD-10-CM

## 2013-07-06 DIAGNOSIS — F988 Other specified behavioral and emotional disorders with onset usually occurring in childhood and adolescence: Secondary | ICD-10-CM

## 2013-07-06 DIAGNOSIS — F329 Major depressive disorder, single episode, unspecified: Secondary | ICD-10-CM

## 2013-07-06 MED ORDER — MIRTAZAPINE 15 MG PO TABS: ORAL_TABLET | ORAL | Status: DC

## 2013-07-06 MED ORDER — SERTRALINE HCL 100 MG PO TABS: ORAL_TABLET | ORAL | Status: DC

## 2013-07-06 MED ORDER — LISDEXAMFETAMINE DIMESYLATE 70 MG PO CAPS: 70.0000 mg | ORAL_CAPSULE | ORAL | Status: DC

## 2013-07-06 NOTE — Progress Notes (Signed)
[facility] [phone] Progress Note  Brittany Chung [id number] 73 y.o.  07/06/2013 4:46 PM  Chief Complaint:  I need my medication.  History of Present Illness:  Patient is a 73 year old Caucasian female who has been seen in this office since December 2011 for the management of ADD.  Patient told that she has diagnoses of mood disorder, depression, anxiety and ADD for many years.  It is unclear if she has a neuropsychological testing but she's been taking stimulant for a long time.  She was getting medication from Dr. Deatra Ina and then [facility] for a long time.  She start seeing provider in this office and she was given moderate dose of stimulant.  She was seeing physician assistant who has left the practice.  Patient is taking higher doses of Vyvanse , Adderall along with Zoloft and Remeron.  Patient endorsed multiple psychosocial stressors.  Her daughter is living with her 5 kids .  Patient admitted more anxious and irritable.  The patient is not working , she is not going to school at this time.  She wanted to do psychology but never finished the college.  She has ninth grade education.  Patient reported that stimulant helping her multitasking and she is less depressed and less anxious.  Patient denies any agitation, anger, mood swings but admitted irritability and sometime insomnia.  She also mentioned that she has diagnosed with bipolar disorder by her primary care physician and given Seroquel but she stopped taking it because it was making her very sleepy and groggy.  She denies any hallucination, paranoia, suicidal thoughts or any homicidal thoughts.  She is not drinking or using any illegal substances.  Patient has cardiac disease, she has hypertension.  She is not seeing any therapist.  Suicidal Ideation: No Plan Formed: No Patient has means to carry out plan: No  Homicidal Ideation: No Plan Formed: No Patient has means to carry out plan: No  Medical  History; Patient has history of irregular heartbeat, stroke, cardiac stenosis, hypertension.  Education and Work History; Patient has ninth grade education.  Currently she is unemployed.  Psychosocial History; Patient lives by herself.  Currently her daughter moved in with her 5 children.  Substance Abuse History; Patient denies any history of drinking or any illegal substance use.  Past Psychiatric History/Hospitalization(s) Patient denies any history of suicidal attempt, inpatient psychiatric treatment of any psychosis.  She told that she was diagnosed with bipolar disorder but denies any history of mania.  She was given Seroquel by her primary care physician but she stopped taking it because of sedation.   Anxiety: Yes Bipolar Disorder: She mentioned diagnosed with bipolar disorder but denies any mania.  We do not have any clear history of mania. Depression: Yes Mania: No Psychosis: No Schizophrenia: No Personality Disorder: No Hospitalization for psychiatric illness: No History of Electroconvulsive Shock Therapy: No Prior Suicide Attempts: No   Review of Systems: Psychiatric: Agitation: No Hallucination: No Depressed Mood: No Insomnia: Yes Hypersomnia: No Altered Concentration: No Feels Worthless: No Grandiose Ideas: No Belief In Special Powers: No New/Increased Substance Abuse: No Compulsions: No  Neurologic: Headache: No Seizure: No Paresthesias: No    Outpatient Encounter Prescriptions as of 07/06/2013  Medication Sig  . aspirin 81 MG chewable tablet Chew 81 mg by mouth every morning.   . bimatoprost (LUMIGAN) 0.03 % ophthalmic drops Place 1 drop into both eyes at bedtime.    . enalapril (VASOTEC) 2.5 MG tablet Take 1 tablet (2.5 mg  total) by mouth daily.  Marland Kitchen lisdexamfetamine (VYVANSE) 70 MG capsule Take 1 capsule (70 mg total) by mouth every morning.  . metFORMIN (GLUCOPHAGE) 850 MG tablet Take 1 tablet (850 mg total) by mouth 2 (two) times daily with a meal.   . mirtazapine (REMERON) 15 MG tablet TAKE 1 TABLET BY MOUTH AT BEDTIME  . Multiple Vitamins-Minerals (MULTIVITAMIN WITH MINERALS) tablet Take 1 tablet by mouth every evening.   Marland Kitchen rOPINIRole (REQUIP) 1 MG tablet TAKE 2 TABLETS (2 MG TOTAL) BY MOUTH AT BEDTIME.  Marland Kitchen sertraline (ZOLOFT) 100 MG tablet TAKE 1 TABLET EVERY DAY  . simvastatin (ZOCOR) 40 MG tablet Take 1 tablet (40 mg total) by mouth at bedtime.  . [DISCONTINUED] amphetamine-dextroamphetamine (ADDERALL) 20 MG tablet Take 1 tablet (20 mg total) by mouth daily.  . [DISCONTINUED] amphetamine-dextroamphetamine (ADDERALL) 20 MG tablet Take 1 tablet (20 mg total) by mouth daily.  . [DISCONTINUED] lisdexamfetamine (VYVANSE) 70 MG capsule Take 1 capsule (70 mg total) by mouth every morning.  . [DISCONTINUED] lisdexamfetamine (VYVANSE) 70 MG capsule Take 1 capsule (70 mg total) by mouth every morning.  . [DISCONTINUED] lisdexamfetamine (VYVANSE) 70 MG capsule Take 1 capsule (70 mg total) by mouth every morning.  . [DISCONTINUED] lisdexamfetamine (VYVANSE) 70 MG capsule Take 1 capsule (70 mg total) by mouth every morning.  . [DISCONTINUED] mirtazapine (REMERON) 15 MG tablet TAKE 1 TABLET BY MOUTH AT BEDTIME  . [DISCONTINUED] sertraline (ZOLOFT) 100 MG tablet TAKE 1 TABLET EVERY DAY  . [DISCONTINUED] amphetamine-dextroamphetamine (ADDERALL) 20 MG tablet Take 1 tablet (20 mg total) by mouth daily.    No results found for this or any previous visit (from the past 2160 hour(s)).    Physical Exam: Constitutional:  BP 146/60  Pulse 88  Ht 5' 3.5" (1.613 m)  Wt 155 lb 12.8 oz (70.67 kg)  BMI 27.16 kg/m2  Musculoskeletal: Strength & Muscle Tone: within normal limits Gait & Station: normal Patient leans: Patient has a normal posture  Mental Status Examination;  Patient is well groomed well dressed female who appears to be her stated age.  Her speech is fast but coherent.  Her thought processes logical and goal directed.  She describes her  mood is neutral and her affect is mood appropriate.  She denies any auditory or visual hallucination.  She denies any active or passive suicidal thoughts or homicidal thoughts.  There were no paranoia or any delusions present at this time.  Her fund of knowledge is average.  She has difficulty organizing her thoughts.  She has no flight of ideas however her thought processes circumstantial.  Her psychomotor activity is slightly increased.  She has difficulty remembering things.  Attention and concentration is fair.  She is alert and oriented x3.  Her insight judgment and impulse control is okay.   Established Problem, Stable/Improving (1), Review of Psycho-Social Stressors (1), Review or order clinical lab tests (1), Decision to obtain old records (1), Review and summation of old records (2), Review of Last Therapy Session (1), Review of Medication Regimen & Side Effects (2) and Review of New Medication or Change in Dosage (2)  Assessment: Axis I: ADD by history, depressive disorder NOS  Axis II: Deferred  Axis III:  Past Medical History  Diagnosis Date  . Irregular heart beat   . Depression   . Stroke 2006  . Cancer     skin  . Carotid stenosis     s/p R CEA 2006  . Cataract     s/p  surgery 2007    Axis IV: Mild   Plan:  I reviewed her symptoms, history, current medication.  Given the fact that she has coronary artery disease, irregular heart rate and hypertension.  I do not believe she requires higher dose of stimulant.  I recommend to discontinue Adderall.  For now we'll continue Vyvanse at present dose but we will consider lowering the dose in the future.  I will continue Zoloft and Remeron at present dose.  We will get collateral information.  I had a long discussion with the patient about risks and benefits of medication.  Patient agreed to the plan.  I will see him again in 2 months.Time spent 25 minutes.  More than 50% of the time spent in psychoeducation, counseling and  coordination of care.  Discuss safety plan that anytime having active suicidal thoughts or homicidal thoughts then patient need to call 911 or go to the local emergency room.    an. Ryun Velez T., MD 07/06/2013

## 2013-07-22 ENCOUNTER — Other Ambulatory Visit (HOSPITAL_COMMUNITY): Payer: Self-pay | Admitting: Psychiatry

## 2013-07-23 ENCOUNTER — Telehealth (HOSPITAL_COMMUNITY): Payer: Self-pay | Admitting: *Deleted

## 2013-07-23 NOTE — Telephone Encounter (Signed)
Patient requested refill of Sertraline Notified pt that new RX sent to pharmacy on 2/2.She can call the pharamcy and ask to have refill given from that RX

## 2013-08-31 ENCOUNTER — Other Ambulatory Visit (HOSPITAL_COMMUNITY): Payer: Self-pay | Admitting: *Deleted

## 2013-08-31 DIAGNOSIS — F988 Other specified behavioral and emotional disorders with onset usually occurring in childhood and adolescence: Secondary | ICD-10-CM

## 2013-08-31 MED ORDER — LISDEXAMFETAMINE DIMESYLATE 70 MG PO CAPS: 70.0000 mg | ORAL_CAPSULE | ORAL | Status: DC

## 2013-09-03 ENCOUNTER — Ambulatory Visit (HOSPITAL_COMMUNITY): Payer: Self-pay | Admitting: Psychiatry

## 2013-09-15 ENCOUNTER — Ambulatory Visit (HOSPITAL_COMMUNITY): Payer: Self-pay | Admitting: Psychiatry

## 2013-09-15 ENCOUNTER — Other Ambulatory Visit (HOSPITAL_COMMUNITY): Payer: Self-pay | Admitting: Psychiatry

## 2013-09-16 ENCOUNTER — Ambulatory Visit (INDEPENDENT_AMBULATORY_CARE_PROVIDER_SITE_OTHER): Payer: Medicare HMO | Admitting: Psychiatry

## 2013-09-16 ENCOUNTER — Encounter (HOSPITAL_COMMUNITY): Payer: Self-pay | Admitting: Psychiatry

## 2013-09-16 VITALS — BP 141/74 | HR 80 | Ht 63.5 in | Wt 151.8 lb

## 2013-09-16 DIAGNOSIS — F3289 Other specified depressive episodes: Secondary | ICD-10-CM

## 2013-09-16 DIAGNOSIS — F988 Other specified behavioral and emotional disorders with onset usually occurring in childhood and adolescence: Secondary | ICD-10-CM

## 2013-09-16 DIAGNOSIS — F329 Major depressive disorder, single episode, unspecified: Secondary | ICD-10-CM

## 2013-09-16 MED ORDER — MIRTAZAPINE 15 MG PO TABS: ORAL_TABLET | ORAL | Status: DC

## 2013-09-16 MED ORDER — LISDEXAMFETAMINE DIMESYLATE 70 MG PO CAPS: 70.0000 mg | ORAL_CAPSULE | ORAL | Status: DC

## 2013-09-16 MED ORDER — SERTRALINE HCL 100 MG PO TABS: ORAL_TABLET | ORAL | Status: DC

## 2013-09-16 NOTE — Progress Notes (Signed)
[facility] [phone] Progress Note  Brittany Chung [id number] 73 y.o.  09/16/2013 3:29 PM  Chief Complaint:  I feel sometimes depressed.    History of Present Illness:  Brittany Chung came for her followup appointment.  On her last visit we stopped Adderall because patient is taking 2 stimulants .  She was taking Vyvanse 70mg  for her ADD symptoms .  Patient has history of coronary artery disease .  She is also taking Zoloft.  She is experiencing increase in anxiety and depression.  Patient admitted that she has no issue with attention and concentration but she's feeling more down and sad but no active or passive suicidal thoughts and homicidal thought.  It is unclear why she has given two stimulant.  Patient is not in the school and she's not working.  She admitted psychosocial stressors.  Her daughter who has 5 kids living with her.  Patient admitted her daughter has a lot of psychosocial issues.  She does not get along with her husband.  This is not interested in counseling.  She denies any agitation, anger, mood swing.  She denies any hallucination, paranoia or any aggression.  Patient is not drinking or using any illicit substances.  She admitted feeling sad due to her living condition.  She does not report any changes in her appetite.  She has lost some weight from the past.    Suicidal Ideation: No Plan Formed: No Patient has means to carry out plan: No  Homicidal Ideation: No Plan Formed: No Patient has means to carry out plan: No  Medical History; Patient has history of irregular heartbeat, stroke, cardiac stenosis, hypertension.  Past Psychiatric History/Hospitalization(s) Patient denies any history of suicidal attempt, inpatient psychiatric treatment of any psychosis.  She told that she was diagnosed with bipolar disorder but denies any history of mania.  She was given Seroquel by her primary care physician but she stopped taking it because of sedation.   Anxiety: Yes Bipolar  Disorder: She mentioned diagnosed with bipolar disorder but denies any mania.  We do not have any clear history of mania. Depression: Yes Mania: No Psychosis: No Schizophrenia: No Personality Disorder: No Hospitalization for psychiatric illness: No History of Electroconvulsive Shock Therapy: No Prior Suicide Attempts: No   Review of Systems: Psychiatric: Agitation: No Hallucination: No Depressed Mood: No Insomnia: Yes Hypersomnia: No Altered Concentration: No Feels Worthless: No Grandiose Ideas: No Belief In Special Powers: No New/Increased Substance Abuse: No Compulsions: No  Neurologic: Headache: No Seizure: No Paresthesias: No    Outpatient Encounter Prescriptions as of 09/16/2013  Medication Sig  . aspirin 81 MG chewable tablet Chew 81 mg by mouth every morning.   . bimatoprost (LUMIGAN) 0.03 % ophthalmic drops Place 1 drop into both eyes at bedtime.    . enalapril (VASOTEC) 2.5 MG tablet Take 1 tablet (2.5 mg total) by mouth daily.  Marland Kitchen lisdexamfetamine (VYVANSE) 70 MG capsule Take 1 capsule (70 mg total) by mouth every morning.  . metFORMIN (GLUCOPHAGE) 850 MG tablet Take 1 tablet (850 mg total) by mouth 2 (two) times daily with a meal.  . mirtazapine (REMERON) 15 MG tablet TAKE 1 TABLET BY MOUTH AT BEDTIME  . Multiple Vitamins-Minerals (MULTIVITAMIN WITH MINERALS) tablet Take 1 tablet by mouth every evening.   Marland Kitchen rOPINIRole (REQUIP) 1 MG tablet TAKE 2 TABLETS (2 MG TOTAL) BY MOUTH AT BEDTIME.  Marland Kitchen sertraline (ZOLOFT) 100 MG tablet TAKE 1 TABLET AND HALF EVERY DAY  . simvastatin (ZOCOR) 40 MG tablet Take  1 tablet (40 mg total) by mouth at bedtime.  . [DISCONTINUED] lisdexamfetamine (VYVANSE) 70 MG capsule Take 1 capsule (70 mg total) by mouth every morning.  . [DISCONTINUED] mirtazapine (REMERON) 15 MG tablet TAKE 1 TABLET BY MOUTH AT BEDTIME  . [DISCONTINUED] sertraline (ZOLOFT) 100 MG tablet TAKE 1 TABLET EVERY DAY    No results found for this or any previous visit  (from the past 2160 hour(s)).    Physical Exam: Constitutional:  BP 141/74  Pulse 80  Ht 5' 3.5" (1.613 m)  Wt 151 lb 12.8 oz (68.856 kg)  BMI 26.47 kg/m2  Musculoskeletal: Strength & Muscle Tone: within normal limits Gait & Station: normal Patient leans: Patient has a normal posture  Mental Status Examination;  Patient is well groomed well dressed female who appears to be her stated age.  Her speech is fast but coherent.  Her thought processes logical and goal directed.  She describes her mood is sad and her affect is mood appropriate.  She denies any auditory or visual hallucination.  She denies any active or passive suicidal thoughts or homicidal thoughts.  There were no paranoia or any delusions present at this time.  Her fund of knowledge is average.  She has difficulty organizing her thoughts.  She has no flight of ideas however her thought processes circumstantial.  Her psychomotor activity is slightly increased.  Her attention and concentration is fair.  She is alert and oriented x3.  Her insight judgment and impulse control is okay.   Established Problem, Stable/Improving (1), Review of Psycho-Social Stressors (1), Review and summation of old records (2), Review of Last Therapy Session (1), Review of Medication Regimen & Side Effects (2) and Review of New Medication or Change in Dosage (2)  Assessment: Axis I: ADD by history, depressive disorder NOS  Axis II: Deferred  Axis III:  Past Medical History  Diagnosis Date  . Irregular heart beat   . Depression   . Stroke 2006  . Cancer     skin  . Carotid stenosis     s/p R CEA 2006  . Cataract     s/p surgery 2007    Axis IV: Mild   Plan:  I reviewed her symptoms, history, current medication.  Given the fact that she has coronary artery disease, irregular heart rate and hypertension.  I do not believe she requires higher dose of stimulant or another stimulant.  Patient to have some depressive symptoms.  I recommended  to try Zoloft 150 mg to help the depression.  I offer counseling but patient declined.  Discussed in detail risks and benefits of medication.  Recommended to call us back if she has any question or any concern.  I will see her in 2 months. Time spent 25 minutes.  More than 50% of the time spent in psychoeducation, counseling and coordination of care.  Discuss safety plan that anytime having active suicidal thoughts or homicidal thoughts then patient need to call 911 or go to the local emergency room.    German Manke T., MD 09/16/2013

## 2013-09-22 ENCOUNTER — Other Ambulatory Visit (HOSPITAL_COMMUNITY): Payer: Self-pay | Admitting: Psychiatry

## 2013-09-22 NOTE — Telephone Encounter (Signed)
Given on 09/16/13 with refill.

## 2013-09-24 ENCOUNTER — Other Ambulatory Visit: Payer: Self-pay | Admitting: Family Medicine

## 2013-09-28 ENCOUNTER — Other Ambulatory Visit (HOSPITAL_COMMUNITY): Payer: Self-pay | Admitting: Psychiatry

## 2013-10-21 ENCOUNTER — Ambulatory Visit (INDEPENDENT_AMBULATORY_CARE_PROVIDER_SITE_OTHER): Payer: Medicare HMO | Admitting: Family Medicine

## 2013-10-21 ENCOUNTER — Encounter: Payer: Self-pay | Admitting: Family Medicine

## 2013-10-21 VITALS — BP 119/64 | HR 86 | Wt 154.0 lb

## 2013-10-21 DIAGNOSIS — F8081 Childhood onset fluency disorder: Secondary | ICD-10-CM

## 2013-10-21 DIAGNOSIS — Z9889 Other specified postprocedural states: Secondary | ICD-10-CM

## 2013-10-21 DIAGNOSIS — K08109 Complete loss of teeth, unspecified cause, unspecified class: Secondary | ICD-10-CM

## 2013-10-21 DIAGNOSIS — E119 Type 2 diabetes mellitus without complications: Secondary | ICD-10-CM

## 2013-10-21 LAB — POCT GLYCOSYLATED HEMOGLOBIN (HGB A1C): HEMOGLOBIN A1C: 8.4

## 2013-10-21 NOTE — Patient Instructions (Signed)
We are referring you to the following:  Orders Placed This Encounter  Procedures  . Ambulatory referral to Speech Therapy- for your stuttering  . Ambulatory referral to Cardiology- to follow up on your former carotid blockage  . Ambulatory referral to Dentistry- for loss of teeth  . POCT A1C - 8.4 today   See me back as soon as possible to talk about your diabetes and health maintainence Dr. Yong Channel  Also, let's check in within 1-2 months on the stuttering or sooner if it worsens.   Health Maintenance Due  Topic Date Due  . Ophthalmology Exam  02/10/1951  . Mammogram  06/25/2009  . Hemoglobin A1c  09/28/2013

## 2013-10-21 NOTE — Assessment & Plan Note (Signed)
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 Review of MRI shows no prior CVA (with reported history of CVA in 2006). Given that patient can resolve symptoms with focus and they are worse with stress/increased emotions and that they have been gradually progressive over a year and that patient has completely normal neuro exam with no evidence of stuttering on my exam, I strongly doubt this represents CVA. I think follow up for carotid arteries with Dr. Gwenlyn Found is reasonable and I have placed a speech consult for evaluation as well. At this time, do no tthink pursuing further workup would be fruitful. Will follow up in 1-2 months and have patient back ASAP to discuss diabetes.

## 2013-10-21 NOTE — Progress Notes (Signed)
Garret Reddish, MD Phone: [phone]  Subjective:   Brittany Chung is a 73 y.o. year old very pleasant female patient who presents with the following:  Stuttering Patient states she has a history of stuttering before she had her carotid artery procedure  In 2008? Endarterectomy on right side (though previously reported L side to me). She states she had a stroke at that time in 2008. She also reports having balance issues since that time. She says after the procedure her symptoms resolved completely.   1 year ago, her stuttering returned and has been gradually progressive. Stuttering goes away if she focuses. Is worse in times of stress or if she is emotional. No difficulty word finding. When she finally is able to state the sentence it is the complete sentence that she intended to say.   Review of medical records shows she had a R carotid endarterectomy in 2006 (per H+P from 2006 though cannot find d/c summary) after she had a negative stroke workup previously due to left arm weakness. She did have dizziness at that time but there was no mention of stuttering.  ROS- no unilateral weakness, no headaches, does have some chronic balance issues for 10 years, does have hearing loss which has been previously evaluated by ENT and appears had hearing aids suggested but patient statest they would not be paid for. She states she has weird facial movements due to the hesitancy at times. No paresthesias, dysphagia, ,visual changes or syncope.   Past Medical History- Patient Active Problem List   Diagnosis Date Noted  . S/P carotid endarterectomy 04/01/2013    Priority: High  . DIABETES-TYPE 2 10/01/2006    Priority: High  . BIPOLAR DISORDER UNSPECIFIED 06/22/2009    Priority: Medium  . HYPERLIPIDEMIA 10/01/2006    Priority: Medium  . HYPERTENSION, BENIGN ESSENTIAL 10/01/2006    Priority: Medium  . Restless leg syndrome 01/18/2012    Priority: Low  . Vitamin D deficiency 01/18/2012   Priority: Low  . Chronic narrow angle glaucoma 10/12/2011    Priority: Low  . Right knee pain 09/20/2011    Priority: Low  . Counseling regarding advanced directives 08/18/2010    Priority: Low  . ADHD 03/16/2010    Priority: Low  . HERPES, GENITAL NOS 02/24/2007    Priority: Low  . Irritable bowel syndrome 09/19/2006    Priority: Low   Medications- reviewed and updated Current Outpatient Prescriptions  Medication Sig Dispense Refill  . aspirin 81 MG chewable tablet Chew 81 mg by mouth every morning.       . bimatoprost (LUMIGAN) 0.03 % ophthalmic drops Place 1 drop into both eyes at bedtime.        . enalapril (VASOTEC) 2.5 MG tablet Take 1 tablet (2.5 mg total) by mouth daily.  90 tablet  3  . lisdexamfetamine (VYVANSE) 70 MG capsule Take 1 capsule (70 mg total) by mouth every morning.  30 capsule  0  . metFORMIN (GLUCOPHAGE) 850 MG tablet Take 1 tablet (850 mg total) by mouth 2 (two) times daily with a meal.  60 tablet  5  . mirtazapine (REMERON) 15 MG tablet TAKE 1 TABLET BY MOUTH AT BEDTIME  30 tablet  1  . Multiple Vitamins-Minerals (MULTIVITAMIN WITH MINERALS) tablet Take 1 tablet by mouth every evening.       Marland Kitchen rOPINIRole (REQUIP) 1 MG tablet TAKE 2 TABLETS (2 MG TOTAL) BY MOUTH AT BEDTIME.  180 tablet  3  . sertraline (ZOLOFT) 100 MG tablet TAKE  1 TABLET AND HALF EVERY DAY  45 tablet  1  . simvastatin (ZOCOR) 40 MG tablet TAKE 1 TABLET BY MOUTH AT BEDTIME  90 tablet  3  . [DISCONTINUED] rOPINIRole (REQUIP) 1 MG tablet Take 1 tablet (1 mg total) by mouth at bedtime.  30 tablet  3   No current facility-administered medications for this visit.    Objective: BP 119/64  Pulse 86  Wt 154 lb (69.854 kg) Gen: NAD, resting comfortably in chair HEENT: poor dentition with multiple lost teeth on bottom and extensive work on top CV: RRR no mrg  Lungs: CTAB  MSK: moves all extremities, no edema  Skin: warm and dry, no rash  Neuro: CN II-XII intact, sensation and reflexes normal  throughout, 5/5 muscle strength in bilateral upper and lower extremities. Normal finger to nose. Normal rapid alternating movements. Normal gait. Alert and oriented x 4. Can repeat No ifs ands or buts with no difficulty. 3 word recall 3/3.   Assessment/Plan:  S/P carotid endarterectomy Given history of carotid endarterectomy on right and history of 60-80% blockage on left, will refer back to Dr. Gwenlyn Found at this time for follow up as has been > 5 years per patient. I doubt this is the cause of her stuttering. Her symptoms are not consistent with CVA.   Stuttering Patient with stuttering that reportedly resolved after a right carotid endarterectomy and has now been recurrent and gradually progressive over last year. Review of MRI shows no prior CVA (with reported history of CVA in 2006). Given that patient can resolve symptoms with focus and they are worse with stress/increased emotions and that they have been gradually progressive over a year and that patient has completely normal neuro exam with no evidence of stuttering on my exam, I strongly doubt this represents CVA. I think follow up for carotid arteries with Dr. Gwenlyn Found is reasonable and I have placed a speech consult for evaluation as well. At this time, do no tthink pursuing further workup would be fruitful. Will follow up in 1-2 months and have patient back ASAP to discuss diabetes.    Orders Placed This Encounter  Procedures  . Ambulatory referral to Speech Therapy- for your stuttering  . Ambulatory referral to Cardiology- to follow up on your former carotid blockage  . Ambulatory referral to Dentistry- for loss of teeth

## 2013-10-21 NOTE — Assessment & Plan Note (Signed)
Given history of carotid endarterectomy on right and history of 60-80% blockage on left, will refer back to Dr. Gwenlyn Found at this time for follow up as has been > 5 years per patient. I doubt this is the cause of her stuttering. Her symptoms are not consistent with CVA.

## 2013-11-02 ENCOUNTER — Telehealth (HOSPITAL_COMMUNITY): Payer: Self-pay

## 2013-11-02 ENCOUNTER — Telehealth (HOSPITAL_COMMUNITY): Payer: Self-pay | Admitting: *Deleted

## 2013-11-02 DIAGNOSIS — F988 Other specified behavioral and emotional disorders with onset usually occurring in childhood and adolescence: Secondary | ICD-10-CM

## 2013-11-02 MED ORDER — LISDEXAMFETAMINE DIMESYLATE 70 MG PO CAPS: 70.0000 mg | ORAL_CAPSULE | ORAL | Status: DC

## 2013-11-02 NOTE — Telephone Encounter (Signed)
Notified patient that prescription ready to pick up

## 2013-11-02 NOTE — Telephone Encounter (Signed)
rx filled

## 2013-11-02 NOTE — Telephone Encounter (Signed)
11/02/13 4:51pm Patient came and pick-up rx script [id number]

## 2013-11-16 ENCOUNTER — Ambulatory Visit (HOSPITAL_COMMUNITY): Payer: Self-pay | Admitting: Psychiatry

## 2013-11-19 ENCOUNTER — Ambulatory Visit (HOSPITAL_COMMUNITY): Payer: Self-pay | Admitting: Psychiatry

## 2013-11-23 ENCOUNTER — Telehealth: Payer: Self-pay | Admitting: Family Medicine

## 2013-11-25 NOTE — Telephone Encounter (Signed)
Call Pt about DM care. When Pt calls back please schedule appointment to check LDL. ° °

## 2013-12-02 ENCOUNTER — Other Ambulatory Visit (HOSPITAL_COMMUNITY): Payer: Self-pay | Admitting: *Deleted

## 2013-12-02 ENCOUNTER — Telehealth (HOSPITAL_COMMUNITY): Payer: Self-pay

## 2013-12-02 DIAGNOSIS — F329 Major depressive disorder, single episode, unspecified: Secondary | ICD-10-CM

## 2013-12-02 DIAGNOSIS — F988 Other specified behavioral and emotional disorders with onset usually occurring in childhood and adolescence: Secondary | ICD-10-CM

## 2013-12-02 DIAGNOSIS — F3289 Other specified depressive episodes: Secondary | ICD-10-CM

## 2013-12-02 MED ORDER — LISDEXAMFETAMINE DIMESYLATE 70 MG PO CAPS: 70.0000 mg | ORAL_CAPSULE | ORAL | Status: DC

## 2013-12-02 MED ORDER — MIRTAZAPINE 15 MG PO TABS: ORAL_TABLET | ORAL | Status: DC

## 2013-12-02 NOTE — Telephone Encounter (Signed)
Dr. Adele Schilder out of office.He requested RX for Vyvanse be signed by Madison Hickman, NP New RX printed with new provider's name

## 2013-12-02 NOTE — Telephone Encounter (Signed)
Brittany Chung picked up her prescription on 12/02/13  DL #  [id number]

## 2013-12-11 ENCOUNTER — Other Ambulatory Visit (HOSPITAL_COMMUNITY): Payer: Self-pay | Admitting: Psychiatry

## 2013-12-14 ENCOUNTER — Ambulatory Visit (INDEPENDENT_AMBULATORY_CARE_PROVIDER_SITE_OTHER): Payer: Medicare HMO | Admitting: Psychiatry

## 2013-12-14 ENCOUNTER — Encounter (HOSPITAL_COMMUNITY): Payer: Self-pay | Admitting: Psychiatry

## 2013-12-14 VITALS — BP 170/76 | HR 93 | Ht 63.5 in | Wt 151.4 lb

## 2013-12-14 DIAGNOSIS — F329 Major depressive disorder, single episode, unspecified: Secondary | ICD-10-CM

## 2013-12-14 DIAGNOSIS — F3289 Other specified depressive episodes: Secondary | ICD-10-CM

## 2013-12-14 DIAGNOSIS — F988 Other specified behavioral and emotional disorders with onset usually occurring in childhood and adolescence: Secondary | ICD-10-CM

## 2013-12-14 MED ORDER — LISDEXAMFETAMINE DIMESYLATE 70 MG PO CAPS: 70.0000 mg | ORAL_CAPSULE | ORAL | Status: DC

## 2013-12-14 MED ORDER — SERTRALINE HCL 100 MG PO TABS: ORAL_TABLET | ORAL | Status: DC

## 2013-12-14 MED ORDER — MIRTAZAPINE 15 MG PO TABS: ORAL_TABLET | ORAL | Status: DC

## 2013-12-14 NOTE — Progress Notes (Signed)
[place] Progress Note  Brittany Chung [id number] 73 y.o.  12/14/2013 4:54 PM  Chief Complaint:  Medication management and followup.      History of Present Illness:  Brittany Chung came for her followup appointment.  She arrived 20 minutes late .  She mentioned some time unable to keep track of the time and get distracted.  She liked agree Zoloft which was adjusted on her last visit.  Patient denies any irritability, anger, mood swings.  She continues to have some ADD symptoms but overall she is as stable on one stimulant.  Patient denies any agitation, anger, mood swing.  She denies any feelings of hopelessness or worthlessness.  There were no paranoia or any delusions.  She has no tremors or shakes. Patient is not drinking or using any illicit substances.  Her vitals are stable.  Her weight and appetite is good.  Suicidal Ideation: No Plan Formed: No Patient has means to carry out plan: No  Homicidal Ideation: No Plan Formed: No Patient has means to carry out plan: No  Medical History; Patient has history of irregular heartbeat, stroke, cardiac stenosis, hypertension.  Past Psychiatric History/Hospitalization(s) Patient denies any history of suicidal attempt, inpatient psychiatric treatment of any psychosis.  She told that she was diagnosed with bipolar disorder but denies any history of mania.  She was given Seroquel by her primary care physician but she stopped taking it because of sedation.   Anxiety: Yes Bipolar Disorder: She mentioned diagnosed with bipolar disorder but denies any mania.  We do not have any clear history of mania. Depression: Yes Mania: No Psychosis: No Schizophrenia: No Personality Disorder: No Hospitalization for psychiatric illness: No History of Electroconvulsive Shock Therapy: No Prior Suicide Attempts: No   Review of Systems: Psychiatric: Agitation: No Hallucination: No Depressed Mood: No Insomnia: Yes Hypersomnia: No Altered  Concentration: No Feels Worthless: No Grandiose Ideas: No Belief In Special Powers: No New/Increased Substance Abuse: No Compulsions: No  Neurologic: Headache: No Seizure: No Paresthesias: No    Outpatient Encounter Prescriptions as of 12/14/2013  Medication Sig  . aspirin 81 MG chewable tablet Chew 81 mg by mouth every morning.   . bimatoprost (LUMIGAN) 0.03 % ophthalmic drops Place 1 drop into both eyes at bedtime.    . enalapril (VASOTEC) 2.5 MG tablet Take 1 tablet (2.5 mg total) by mouth daily.  Marland Kitchen lisdexamfetamine (VYVANSE) 70 MG capsule Take 1 capsule (70 mg total) by mouth every morning.  . metFORMIN (GLUCOPHAGE) 850 MG tablet Take 1 tablet (850 mg total) by mouth 2 (two) times daily with a meal.  . mirtazapine (REMERON) 15 MG tablet TAKE 1 TABLET BY MOUTH AT BEDTIME  . Multiple Vitamins-Minerals (MULTIVITAMIN WITH MINERALS) tablet Take 1 tablet by mouth every evening.   Marland Kitchen rOPINIRole (REQUIP) 1 MG tablet TAKE 2 TABLETS (2 MG TOTAL) BY MOUTH AT BEDTIME.  Marland Kitchen sertraline (ZOLOFT) 100 MG tablet TAKE 1 TABLET AND HALF EVERY DAY  . simvastatin (ZOCOR) 40 MG tablet TAKE 1 TABLET BY MOUTH AT BEDTIME  . [DISCONTINUED] lisdexamfetamine (VYVANSE) 70 MG capsule Take 1 capsule (70 mg total) by mouth every morning.  . [DISCONTINUED] lisdexamfetamine (VYVANSE) 70 MG capsule Take 1 capsule (70 mg total) by mouth every morning.  . [DISCONTINUED] mirtazapine (REMERON) 15 MG tablet TAKE 1 TABLET BY MOUTH AT BEDTIME  . [DISCONTINUED] sertraline (ZOLOFT) 100 MG tablet TAKE 1 TABLET AND HALF EVERY DAY    Recent Results (from the past 2160 hour(s))  POCT GLYCOSYLATED HEMOGLOBIN (  HGB A1C)     Status: Abnormal   Collection Time    10/21/13 11:36 AM      Result Value Ref Range   Hemoglobin A1C 8.4        Physical Exam: Constitutional:  BP 170/76  Pulse 93  Ht 5' 3.5" (1.613 m)  Wt 151 lb 6.4 oz (68.675 kg)  BMI 26.40 kg/m2  Musculoskeletal: Strength & Muscle Tone: within normal  limits Gait & Station: normal Patient leans: Patient has a normal posture  Mental Status Examination;  Patient is well groomed well dressed female who appears to be her stated age.  Her speech is fast but coherent.  Her thought processes logical and goal directed.  She describes her mood is euthymic and her affect is mood appropriate.  She denies any auditory or visual hallucination.  She denies any active or passive suicidal thoughts or homicidal thoughts.  There were no paranoia or any delusions present at this time.  Her fund of knowledge is average.  Her thought process is logical.  Her psychomotor activity is slightly increased.  Her attention and concentration is fair.  She is alert and oriented x3.  Her insight judgment and impulse control is okay.   Established Problem, Stable/Improving (1), Review of Psycho-Social Stressors (1), Review of Last Therapy Session (1) and Review of Medication Regimen & Side Effects (2)  Assessment: Axis I: ADD by history, depressive disorder NOS  Axis II: Deferred  Axis III:  Past Medical History  Diagnosis Date  . Irregular heart beat   . Depression   . Stroke 2006  . Cancer     skin  . Carotid stenosis     s/p R CEA 2006  . Cataract     s/p surgery 2007    Axis IV: Mild   Plan:  Patient is doing better on her current psychotropic medication.  She still have some residual symptoms of ADD but given the history of coronary artery disease I will defer any further increase in her stimulant.  Continue Vyvanse M.D. milligram daily and Zoloft 150 mg.  Continue Remeron at bedtime for insomnia and anxiety symptoms.  Recommended to call us back if she has any question or any concern.  Followup in 3 months.  Keishawn Darsey T., MD 12/14/2013

## 2013-12-23 ENCOUNTER — Telehealth: Payer: Self-pay | Admitting: Family Medicine

## 2013-12-23 NOTE — Telephone Encounter (Signed)
Pt called because she is unsure why is no longer taking Strattera? She found a bottle at her home and she asked the pharmacist and they told her that she has not had any refills on this since 08/2011. Please call to verify why she is not taking this. jw

## 2013-12-23 NOTE — Telephone Encounter (Signed)
Spoke with patient and advised her to speak with her [facility] doctor as to why this was stopped.  She states that they told her it was for her heart condition.  Pt has an appt on 01-05-14 to see pcp and will ask about this then. Jazmin Hartsell,CMA

## 2014-01-05 ENCOUNTER — Encounter: Payer: Self-pay | Admitting: Family Medicine

## 2014-01-05 ENCOUNTER — Ambulatory Visit (INDEPENDENT_AMBULATORY_CARE_PROVIDER_SITE_OTHER): Payer: Medicare HMO | Admitting: Family Medicine

## 2014-01-05 VITALS — BP 123/50 | HR 78 | Temp 98.6°F | Wt 154.0 lb

## 2014-01-05 DIAGNOSIS — E119 Type 2 diabetes mellitus without complications: Secondary | ICD-10-CM

## 2014-01-05 DIAGNOSIS — M542 Cervicalgia: Secondary | ICD-10-CM

## 2014-01-05 MED ORDER — SITAGLIPTIN PHOSPHATE 100 MG PO TABS: 100.0000 mg | ORAL_TABLET | Freq: Every day | ORAL | Status: DC

## 2014-01-05 NOTE — Assessment & Plan Note (Addendum)
A1C 8.4 in May. Will start Januvia today. Informed patient about warning signs of hypoglycemia. Also stressed importance of lifestyle modifications. Will follow up in 2-3 months for recheck.

## 2014-01-05 NOTE — Progress Notes (Signed)
Brittany Chung is a 73 y.o. female who presents to [facility] today for diabetes follow up. Her concerns today include:  HPI: Diabetes: A1c 3 months ago 8.4. On chart review it appears as if her A1C has been hovering in the 7-8 range for the past several years compared to being in the 6 range 5+ years ago. Patient attributes this to exercising less and eating unhealthy due to depression since the death of her husband. Patient also lives with her daughter and 5 children and reports eating a lot of pasta and fast food. Reports good compliance with metformin and no side effects. No symptoms of hypoglycemia. No vision changes. No chest pain or shortness of breath. No polyuria or polydipsia. Some urinary incontinence at baseline.  Left Posterior Neck Pain Started several months ago. Says it feels like a "crick" in her neck. 10/10 at worst. 2/10 at best. Occurs everyday. Has tried ibuprofen and massages. Does not radiate into arms. No weakness. Worried that it may be due to ear infection.      Past Medical History  Diagnosis Date  . Irregular heart beat   . Depression   . Stroke 2006  . Cancer     skin  . Carotid stenosis     s/p R CEA 2006  . Cataract     s/p surgery 2007    ROS as above.    Medications reviewed. Current Outpatient Prescriptions  Medication Sig Dispense Refill  . aspirin 81 MG chewable tablet Chew 81 mg by mouth every morning.       . bimatoprost (LUMIGAN) 0.03 % ophthalmic drops Place 1 drop into both eyes at bedtime.        . enalapril (VASOTEC) 2.5 MG tablet Take 1 tablet (2.5 mg total) by mouth daily.  90 tablet  3  . lisdexamfetamine (VYVANSE) 70 MG capsule Take 1 capsule (70 mg total) by mouth every morning.  30 capsule  0  . metFORMIN (GLUCOPHAGE) 850 MG tablet Take 1 tablet (850 mg total) by mouth 2 (two) times daily with a meal.  60 tablet  5  . mirtazapine (REMERON) 15 MG tablet TAKE 1 TABLET BY MOUTH AT BEDTIME  30 tablet  2  . Multiple Vitamins-Minerals  (MULTIVITAMIN WITH MINERALS) tablet Take 1 tablet by mouth every evening.       Marland Kitchen rOPINIRole (REQUIP) 1 MG tablet TAKE 2 TABLETS (2 MG TOTAL) BY MOUTH AT BEDTIME.  180 tablet  3  . sertraline (ZOLOFT) 100 MG tablet TAKE 1 TABLET AND HALF EVERY DAY  45 tablet  2  . simvastatin (ZOCOR) 40 MG tablet TAKE 1 TABLET BY MOUTH AT BEDTIME  90 tablet  3  . sitaGLIPtin (JANUVIA) 100 MG tablet Take 1 tablet (100 mg total) by mouth daily.  30 tablet  2  . [DISCONTINUED] rOPINIRole (REQUIP) 1 MG tablet Take 1 tablet (1 mg total) by mouth at bedtime.  30 tablet  3   No current facility-administered medications for this visit.    Exam:  BP 123/50  Pulse 78  Temp(Src) 98.6 F (37 C) (Oral)  Wt 154 lb (69.854 kg) Gen: Well NAD HEENT: EOMI,  MMM, neck FROM, mildly tender to palpation just lateral to cervical spine on left. L and R TM with moderate sclerosis, no effusions or erythema appreciated. Lungs: CTABL Nl WOB Heart: RRR no MRG Abd: NABS, NT, ND Exts: Non edematous BL  LE, warm and well perfused.   No results found for this or  any previous visit (from the past 72 hour(s)).  A/P: See problem list  DIABETES-TYPE 2 A1C 8.4 in May. Will start Januvia today. Informed patient about warning signs of hypoglycemia. Also stressed importance of lifestyle modifications. Will follow up in 2-3 months for recheck.  Neck pain Left sided neck pain. No signs of ear infection. Likely due to muscle sprain. Recommended continued OTC NSAIDs and massages.   Algis Greenhouse. Jerline Pain, [place] Resident PGY-1 01/05/2014 3:47 PM

## 2014-01-05 NOTE — Patient Instructions (Addendum)
Thank you for coming to the clinic today. Your A1C is 8.4. Today we started a medicine for your diabetes called [place]. It is important to watch for signs of low blood sugar while on this medicine. We also looked at ear, there were no signs of infection. Continue ibuprofen and massaging youtr neck.

## 2014-01-05 NOTE — Assessment & Plan Note (Signed)
Left sided neck pain. No signs of ear infection. Likely due to muscle sprain. Recommended continued OTC NSAIDs and massages.

## 2014-02-09 ENCOUNTER — Other Ambulatory Visit: Payer: Self-pay | Admitting: Family Medicine

## 2014-02-09 ENCOUNTER — Ambulatory Visit (INDEPENDENT_AMBULATORY_CARE_PROVIDER_SITE_OTHER): Payer: Medicare HMO | Admitting: Family Medicine

## 2014-02-09 ENCOUNTER — Encounter: Payer: Self-pay | Admitting: Family Medicine

## 2014-02-09 VITALS — BP 104/59 | HR 81 | Temp 98.0°F | Ht 63.5 in | Wt 142.0 lb

## 2014-02-09 DIAGNOSIS — J189 Pneumonia, unspecified organism: Secondary | ICD-10-CM | POA: Insufficient documentation

## 2014-02-09 DIAGNOSIS — F0390 Unspecified dementia without behavioral disturbance: Secondary | ICD-10-CM

## 2014-02-09 DIAGNOSIS — A481 Legionnaires' disease: Secondary | ICD-10-CM | POA: Insufficient documentation

## 2014-02-09 NOTE — Progress Notes (Signed)
Brittany Chung is a 73 y.o. female who presents today for hospital follow up for SDA??  Hospital F/U - Pt d/c from hospital on 9/1 in [place] from [facility] with Legionella PNA.  She has been on Levaquin now for 8 days and her last dose is tomorrow.  Daughter is worried about her polypharmacy and is interested in geriatric clinic.   Past Medical History  Diagnosis Date  . Irregular heart beat   . Depression   . Stroke 2006  . Cancer     skin  . Carotid stenosis     s/p R CEA 2006  . Cataract     s/p surgery 2007    History  Smoking status  . Never Smoker   Smokeless tobacco  . Never Used    Family History  Problem Relation Age of Onset  . Cancer Mother     Breast  . Aneurysm Father   . Cancer Sister     lung cancer  . Cancer Daughter     colon cancer    Current Outpatient Prescriptions on File Prior to Visit  Medication Sig Dispense Refill  . aspirin 81 MG chewable tablet Chew 81 mg by mouth every morning.       . bimatoprost (LUMIGAN) 0.03 % ophthalmic drops Place 1 drop into both eyes at bedtime.        . enalapril (VASOTEC) 2.5 MG tablet Take 1 tablet (2.5 mg total) by mouth daily.  90 tablet  3  . lisdexamfetamine (VYVANSE) 70 MG capsule Take 1 capsule (70 mg total) by mouth every morning.  30 capsule  0  . metFORMIN (GLUCOPHAGE) 850 MG tablet Take 1 tablet (850 mg total) by mouth 2 (two) times daily with a meal.  60 tablet  5  . mirtazapine (REMERON) 15 MG tablet TAKE 1 TABLET BY MOUTH AT BEDTIME  30 tablet  2  . Multiple Vitamins-Minerals (MULTIVITAMIN WITH MINERALS) tablet Take 1 tablet by mouth every evening.       Marland Kitchen rOPINIRole (REQUIP) 1 MG tablet TAKE 2 TABLETS (2 MG TOTAL) BY MOUTH AT BEDTIME.  180 tablet  3  . sertraline (ZOLOFT) 100 MG tablet TAKE 1 TABLET AND HALF EVERY DAY  45 tablet  2  . simvastatin (ZOCOR) 40 MG tablet TAKE 1 TABLET BY MOUTH AT BEDTIME  90 tablet  3  . sitaGLIPtin (JANUVIA) 100 MG tablet Take 1 tablet (100 mg total) by mouth  daily.  30 tablet  2  . [DISCONTINUED] rOPINIRole (REQUIP) 1 MG tablet Take 1 tablet (1 mg total) by mouth at bedtime.  30 tablet  3   No current facility-administered medications on file prior to visit.    ROS: Per HPI.  All other systems reviewed and are negative.   Physical Exam Filed Vitals:   02/09/14 1427  BP: 104/59  Pulse: 81  Temp: 98 F (36.7 C)    Physical Examination: General appearance - alert, well appearing, and in no distress Chest - clear to auscultation, no wheezes, rales or rhonchi, symmetric air entry Heart - normal rate and regular rhythm, no murmurs appreciated    Lab Results  Component Value Date   HGBA1C 8.4 10/21/2013

## 2014-02-09 NOTE — Patient Instructions (Signed)
Please see Dr. McDiarmid in [facility].  Thank you, Dr. Awanda Mink

## 2014-02-09 NOTE — Assessment & Plan Note (Signed)
Resolving Legionnaires' disease (+ Urine Legionella from OSH [ [facility] in [place]).  Completes last dose of Levaquin tomorrow and her lungs are clear today.  Will refer to geriatric clinic for polypharmacy and help in medication/home management as daughter is quite worried about this.

## 2014-02-16 ENCOUNTER — Telehealth: Payer: Self-pay | Admitting: Family Medicine

## 2014-02-16 NOTE — Telephone Encounter (Signed)
FYI

## 2014-02-16 NOTE — Telephone Encounter (Signed)
[place] 02-02-14  diagnois code [phone] 02-02-14---02-04-14

## 2014-02-18 MED ORDER — REALITY INCONTINENT BRIEFS SM MISC: Status: DC

## 2014-02-18 NOTE — Telephone Encounter (Signed)
[facility] I am unsure what type of glucometer or strips patient was using, so I will not be able to refill those at this time, however I would be happy to refill if she could find what kind she needs.   Algis Greenhouse. Jerline Pain, [place] Resident PGY-1 02/18/2014 12:22 PM

## 2014-02-18 NOTE — Telephone Encounter (Signed)
Daughter calling to say that mom's test strip rx hasn't been filled since 2013.  Patient hasn't been testing her b/s.  Concerned about this.  Want to know if she need to wait until seen in [facility] next Tuesday to get new refills or if some and be sent to her pharmacy until appt.  Please advise.  You can reach her daughter,Melanie at [phone].  Also need a rx written for incontinent adult diapers that are the brief type sent to [facility].

## 2014-02-20 ENCOUNTER — Other Ambulatory Visit: Payer: Self-pay | Admitting: Family Medicine

## 2014-02-23 ENCOUNTER — Ambulatory Visit (INDEPENDENT_AMBULATORY_CARE_PROVIDER_SITE_OTHER): Payer: Commercial Managed Care - HMO | Admitting: Family Medicine

## 2014-02-23 VITALS — BP 135/70 | HR 78 | Temp 98.5°F | Ht 63.5 in

## 2014-02-23 DIAGNOSIS — Z9189 Other specified personal risk factors, not elsewhere classified: Secondary | ICD-10-CM

## 2014-02-23 DIAGNOSIS — G3184 Mild cognitive impairment, so stated: Secondary | ICD-10-CM

## 2014-02-23 MED ORDER — REALITY INCONTINENT BRIEFS SM MISC: Status: DC

## 2014-02-23 NOTE — Progress Notes (Signed)
Results from Geriatric Depression Screening 02/23/2014  Satisfied with your life   Yes Dropped many of your activities Yes  Feel life is empty   No Often get bored   No In good spirits most of time  No Feel something bad will happen No Feel happy most of the time  Yes Feel helpless    Yes Prefer to stay home   Yes Feel you have more problems with memory No Think it is wonderful to be alive Yes Feel worthless   No Feel full of energy   Yes Feel your situation is hopeless No Think most persons are better off No   Who lives with you: daughter, Deland Pretty and 5 grandchildren live with her in her house Pt reports having smoke detectors, does not have adaptive equipment in bathroom, reports living in ranch home, has several area rugs, reports good lightening.  Hobbies: shopping, music
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
## 2014-02-23 NOTE — Assessment & Plan Note (Signed)
Mildly low Moca score of 24/30 with limitations in iADLs Discussed normal progression/course of the disease Given packet for geriatric community resources Recommended strongly group activities and regular exercise Follow up with PCP in 2 months
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
## 2014-02-23 NOTE — Patient Instructions (Addendum)
If the patient continues to drive, recommend that they attend a safe driving class sponsored by [facility] and by [employer].  Other options are referral to [facility] in [place], [place] for evaluation and recommendations by Certified Driving Rehabilitation occupational therapist.   If you still have concerns about driver and public safety, contact the [place] [employer]'s Medical Evaluation Program (find online) to report your concern.  The report is not anonymous.   The Medical Evaluation Program will gather medical information about the patient to decide if the patient requires a Road Test and if they may continue to have the privilege to drive.   Contact [facility] audiology for questions about hearing tests and hearing aids.

## 2014-02-23 NOTE — Progress Notes (Deleted)
   Subjective:    Patient ID: Brittany Chung, female    DOB: [birth year], 73 y.o.   MRN: [id number]  HPI    Review of Systems     Objective:   Physical Exam        Assessment & Plan:

## 2014-02-26 ENCOUNTER — Telehealth: Payer: Self-pay | Admitting: Family Medicine

## 2014-02-26 MED ORDER — METFORMIN HCL 850 MG PO TABS: 850.0000 mg | ORAL_TABLET | Freq: Two times a day (BID) | ORAL | Status: DC

## 2014-02-26 NOTE — Telephone Encounter (Signed)
Refilled given the circumstance and that I last saw her in Geriatric clinic. Will notify PCP.   She will need an appt for follow up appt with Dr. Jerline Pain in the next 2 months. Will ask staff to inform and arrange appt.   Laroy Apple, MD [place] Resident, PGY-3 02/26/2014, 12:28 PM

## 2014-02-26 NOTE — Telephone Encounter (Signed)
Refill request for Metformin. Original request sent on 9/19 under Dr. Yong Channel and was refused by his new nurse at [facility]. Please refill ASAP, as she is completely out. Also, Incontinence Supplies and diabetic supplies were never received by pharmacy after Geri appt on 9/22. Please resend.

## 2014-02-26 NOTE — Telephone Encounter (Signed)
Pt informed. Blount, [place] CMA 

## 2014-03-08 ENCOUNTER — Other Ambulatory Visit: Payer: Self-pay | Admitting: Family Medicine

## 2014-03-08 MED ORDER — REALITY INCONTINENT BRIEFS SM MISC: Status: DC

## 2014-03-08 NOTE — Telephone Encounter (Signed)
Printed and signed incontinent supplies prescription. I still do not see what diabetic supplies the patient has used in the past, nor do I see a clear indication for her to check her blood sugar daily. I do not think she needs a refill at this time, however we can discuss this at our next appointment.  Algis Greenhouse. Jerline Pain, [place] Resident PGY-1 03/08/2014 4:46 PM

## 2014-03-08 NOTE — Telephone Encounter (Signed)
Needs incontinent supplies and diabetic supplies refilled

## 2014-03-08 NOTE — Telephone Encounter (Signed)
Will send to MD to write an order for these supplies.  Will call patient to see where she wants this sent or does she want to pick it up. Brittany Chung,CMA

## 2014-03-08 NOTE — Telephone Encounter (Signed)
LM for patient to call back.  Please give message from MD.  Ask if she wants to pick up rx for incontinence supplies or where she would like it faxed.  Thanks [employer]

## 2014-03-11 ENCOUNTER — Telehealth (HOSPITAL_COMMUNITY): Payer: Self-pay | Admitting: *Deleted

## 2014-03-11 ENCOUNTER — Telehealth: Payer: Self-pay | Admitting: Family Medicine

## 2014-03-11 DIAGNOSIS — F988 Other specified behavioral and emotional disorders with onset usually occurring in childhood and adolescence: Secondary | ICD-10-CM

## 2014-03-11 MED ORDER — LISDEXAMFETAMINE DIMESYLATE 70 MG PO CAPS: 70.0000 mg | ORAL_CAPSULE | ORAL | Status: DC

## 2014-03-11 NOTE — Telephone Encounter (Signed)
Chart reviewed, refill appropriate. Called [facility] to clarify that patient did fill 02/09/14 as she stated. Last printed July 2015. [facility] confirmed she filled it [facility]/09/14 and had prior script from [facility] for Aug refill.

## 2014-03-11 NOTE — Telephone Encounter (Signed)
Daughter called regarding rx for diabetic supplies and incontinent supplies.  Do not see rx at front desk for incontinent supplies.  Daughter wanted to know why necessary to be seen for diabetic supplies rx when pat was seen in [facility] and Dr. McDiarmid informed daughter that he sent in rx for the above.  Please confer with Dr. McDiarmid regarding this and contact daughter to inform her as to what is going on.

## 2014-03-16 ENCOUNTER — Ambulatory Visit (INDEPENDENT_AMBULATORY_CARE_PROVIDER_SITE_OTHER): Payer: Commercial Managed Care - HMO | Admitting: Psychiatry

## 2014-03-16 ENCOUNTER — Encounter (HOSPITAL_COMMUNITY): Payer: Self-pay | Admitting: Psychiatry

## 2014-03-16 VITALS — BP 130/68 | HR 76 | Ht 63.5 in | Wt 141.8 lb

## 2014-03-16 DIAGNOSIS — F32A Depression, unspecified: Secondary | ICD-10-CM

## 2014-03-16 DIAGNOSIS — F909 Attention-deficit hyperactivity disorder, unspecified type: Secondary | ICD-10-CM

## 2014-03-16 DIAGNOSIS — F988 Other specified behavioral and emotional disorders with onset usually occurring in childhood and adolescence: Secondary | ICD-10-CM

## 2014-03-16 DIAGNOSIS — F329 Major depressive disorder, single episode, unspecified: Secondary | ICD-10-CM

## 2014-03-16 MED ORDER — SERTRALINE HCL 100 MG PO TABS: ORAL_TABLET | ORAL | Status: DC

## 2014-03-16 MED ORDER — MIRTAZAPINE 15 MG PO TABS: ORAL_TABLET | ORAL | Status: DC

## 2014-03-16 MED ORDER — LISDEXAMFETAMINE DIMESYLATE 70 MG PO CAPS: 70.0000 mg | ORAL_CAPSULE | ORAL | Status: DC

## 2014-03-16 NOTE — Progress Notes (Signed)
[facility] Progress Note  Brittany Chung [id number] 73 y.o.  03/16/2014 3:57 PM  Chief Complaint:  Medication management and followup.      History of Present Illness:  Brittany Chung came for her followup appointment.  She is taking Zoloft Remeron and Vyvanse.  I reviewed her chart, on her medication list is Seroquel 100 mg at bedtime.  It is unclear who was prescribing the medication.  When I ask about the patient patient told that she was prescribed by the clinician however she had stopped taking it.  Patient was admitted because of legionaire pneumonia in [place] when she was visiting her younger son.  She became very sick and she developed respiratory signs symptoms while she was on the plane with [employer].  She remembered being very sick and she was given antibiotic.  However she is feeling better now.  She is not taking Seroquel.  She likes Remeron and Zoloft.  She is to have some time anxiety and depression but her mood has been mostly stable.   She denies any agitation, anger, mood swings or any crying spells.  Her ADD symptoms are under control with Vyvanse.  She gets sometime forgetful and has difficulty in concentration and attention.  However her appetite is okay.  Her sleep is okay.  She had a good time with her son.  She lives with her daughter and 5 grandkids.  She had 5 other children who lives close by.  Together she has 24 grandchildren.  Her appetite is okay.  Her weight is stable.  She recently seen her primary care physician.  Suicidal Ideation: No Plan Formed: No Patient has means to carry out plan: No  Homicidal Ideation: No Plan Formed: No Patient has means to carry out plan: No  Medical History; Patient has history of irregular heartbeat, stroke, cardiac stenosis, hypertension.  Past Psychiatric History/Hospitalization(s) Patient denies any history of suicidal attempt, inpatient psychiatric treatment of any psychosis.  She told that she was  diagnosed with bipolar disorder but denies any history of mania.  She was given Seroquel by her primary care physician but she stopped taking it because of sedation.   Anxiety: Yes Bipolar Disorder: She mentioned diagnosed with bipolar disorder but denies any mania.  We do not have any clear history of mania. Depression: Yes Mania: No Psychosis: No Schizophrenia: No Personality Disorder: No Hospitalization for psychiatric illness: No History of Electroconvulsive Shock Therapy: No Prior Suicide Attempts: No   Review of Systems: Psychiatric: Agitation: No Hallucination: No Depressed Mood: No Insomnia: Yes Hypersomnia: No Altered Concentration: No Feels Worthless: No Grandiose Ideas: No Belief In Special Powers: No New/Increased Substance Abuse: No Compulsions: No  Neurologic: Headache: No Seizure: No Paresthesias: No    Outpatient Encounter Prescriptions as of 03/16/2014  Medication Sig  . aspirin 81 MG chewable tablet Chew 81 mg by mouth every morning.   Marland Kitchen b complex vitamins tablet Take 1 tablet by mouth daily.  . bimatoprost (LUMIGAN) 0.03 % ophthalmic drops Place 1 drop into both eyes at bedtime.    . Coenzyme Q10 (CO Q-10) 100 MG CAPS Take by mouth.  . enalapril (VASOTEC) 2.5 MG tablet Take 1 tablet (2.5 mg total) by mouth daily.  Donnie Aho (MSM GLUCOSAMINE COMPLEX PO) Take by mouth.  . Incontinence Supply Disposable (REALITY INCONTINENT BRIEFS SM) MISC Use as needed.  Marland Kitchen lisdexamfetamine (VYVANSE) 70 MG capsule Take 1 capsule (70 mg total) by mouth every morning.  . metFORMIN (GLUCOPHAGE) 850 MG tablet  Take 1 tablet (850 mg total) by mouth 2 (two) times daily with a meal.  . mirtazapine (REMERON) 15 MG tablet TAKE 1 TABLET BY MOUTH AT BEDTIME  . Multiple Vitamins-Minerals (MULTIVITAMIN WITH MINERALS) tablet Take 1 tablet by mouth every evening.   . Omega-3 Fatty Acids (FISH OIL) 1000 MG CAPS Take 1 capsule by mouth daily.  Marland Kitchen rOPINIRole (REQUIP)  1 MG tablet TAKE 2 TABLETS (2 MG TOTAL) BY MOUTH AT BEDTIME.  Marland Kitchen sertraline (ZOLOFT) 100 MG tablet TAKE 1 TABLET AND HALF EVERY DAY  . simvastatin (ZOCOR) 40 MG tablet TAKE 1 TABLET BY MOUTH AT BEDTIME  . sitaGLIPtin (JANUVIA) 100 MG tablet Take 1 tablet (100 mg total) by mouth daily.  . [DISCONTINUED] lisdexamfetamine (VYVANSE) 70 MG capsule Take 1 capsule (70 mg total) by mouth every morning.  . [DISCONTINUED] mirtazapine (REMERON) 15 MG tablet TAKE 1 TABLET BY MOUTH AT BEDTIME  . [DISCONTINUED] QUEtiapine (SEROQUEL) 100 MG tablet Take 100 mg by mouth at bedtime.  . [DISCONTINUED] sertraline (ZOLOFT) 100 MG tablet TAKE 1 TABLET AND HALF EVERY DAY    No results found for this or any previous visit (from the past 2160 hour(s)).    Physical Exam: Constitutional:  BP 130/68  Pulse 76  Ht 5' 3.5" (1.613 m)  Wt 141 lb 12.8 oz (64.32 kg)  BMI 24.72 kg/m2  Musculoskeletal: Strength & Muscle Tone: within normal limits Gait & Station: normal Patient leans: Patient has a normal posture  Mental Status Examination;  Patient is well groomed well dressed female who appears to be her stated age.  Her speech is fast but coherent.  Her thought processes logical and goal directed.  She describes her mood happy and good .  Her affect is in full range .  She denies any auditory or visual hallucination.  She denies any active or passive suicidal thoughts or homicidal thoughts.  There were no paranoia or any delusions present at this time.  Her fund of knowledge is average.  Her thought process is logical.  Her psychomotor activity is slightly increased.  Her attention and concentration is fair.  She is alert and oriented x3.  Her insight judgment and impulse control is okay.   Established Problem, Stable/Improving (1), Review of Psycho-Social Stressors (1), Review or order clinical lab tests (1), Review and summation of old records (2), Review of Last Therapy Session (1), Review of Medication Regimen &  Side Effects (2) and Review of New Medication or Change in Dosage (2)  Assessment: Axis I: ADD by history, depressive disorder NOS  Axis II: Deferred  Axis III:  Past Medical History  Diagnosis Date  . Irregular heart beat   . Depression   . Stroke 2006  . Cancer     skin  . Carotid stenosis     s/p R CEA 2006  . Cataract     s/p surgery 2007    Axis IV: Mild   Plan:  I will discontinue quetiapine since patient is not taking it and it is unclear who started her on quetiapine.  She is doing better on her current psychotropic medication.  She still have some residual symptoms of ADD but given the history of coronary artery disease we will defer any adjustment n her stimulant.  Continue Vyvanse 70 mg daily, Remeron 15 mg at bedtime and Zoloft 150 mg.  Recommended to call us back if she has any question or any concern.  Followup in 3 months.  Burke Terry T., MD 03/16/2014

## 2014-03-24 ENCOUNTER — Other Ambulatory Visit (HOSPITAL_COMMUNITY): Payer: Self-pay | Admitting: Psychiatry

## 2014-03-26 ENCOUNTER — Ambulatory Visit (INDEPENDENT_AMBULATORY_CARE_PROVIDER_SITE_OTHER): Payer: Commercial Managed Care - HMO | Admitting: Family Medicine

## 2014-03-26 ENCOUNTER — Encounter: Payer: Self-pay | Admitting: Family Medicine

## 2014-03-26 VITALS — BP 141/77 | HR 82 | Temp 98.4°F | Ht 64.0 in | Wt 143.0 lb

## 2014-03-26 DIAGNOSIS — A481 Legionnaires' disease: Secondary | ICD-10-CM

## 2014-03-26 DIAGNOSIS — N393 Stress incontinence (female) (male): Secondary | ICD-10-CM

## 2014-03-26 DIAGNOSIS — Z23 Encounter for immunization: Secondary | ICD-10-CM

## 2014-03-26 DIAGNOSIS — E119 Type 2 diabetes mellitus without complications: Secondary | ICD-10-CM

## 2014-03-26 DIAGNOSIS — R32 Unspecified urinary incontinence: Secondary | ICD-10-CM

## 2014-03-26 DIAGNOSIS — F8081 Childhood onset fluency disorder: Secondary | ICD-10-CM

## 2014-03-26 DIAGNOSIS — H9193 Unspecified hearing loss, bilateral: Secondary | ICD-10-CM

## 2014-03-26 DIAGNOSIS — H9190 Unspecified hearing loss, unspecified ear: Secondary | ICD-10-CM | POA: Insufficient documentation

## 2014-03-26 LAB — POCT GLYCOSYLATED HEMOGLOBIN (HGB A1C): HEMOGLOBIN A1C: 6.8

## 2014-03-26 MED ORDER — REALITY INCONTINENT BRIEFS SM MISC: Status: DC

## 2014-03-26 NOTE — Patient Instructions (Addendum)
Thank you for coming to the clinic today. It was nice seeing you.  For your diabetes, continue your metformin and januvia. You A1C today was improved to 6.8. I have additionally give you a prescription for diabetic testing supplies, though it is not medically necessary for you to routinely check your blood sugar.  For your stuttering, we have put in a referral for the speech therapy. They should call you with an appointment.  For your hearing, we have made a referral to audiology for potential hearing aid placement.   For your incontinent supplies, I have [facility]. Please call them at [phone] if you have not heard from them by next week.  See you again in 6 months, or sooner if you need anything else.

## 2014-03-26 NOTE — Assessment & Plan Note (Signed)
Resolved. Breathing and strength improved back to baseline.

## 2014-03-26 NOTE — Assessment & Plan Note (Signed)
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 Will refer to audiology for possible hearing aid placement.

## 2014-03-26 NOTE — Assessment & Plan Note (Signed)
Will refer to speech therapy. No signs of neurologic deficits on physical exam and worsened with stress and improved with focus, thus low likelihood of CVA. Additionally, no stroke on MRI from 2011.

## 2014-03-26 NOTE — Assessment & Plan Note (Signed)
Doing well on metformin and januvia, will continue.  A1C 6.8 today. Gave written prescription for blood glucose monitoring supplies, though informed patient that this was not medically necessary given her good glycemic control.

## 2014-03-26 NOTE — Progress Notes (Signed)
Patient ID: Brittany Chung, female   DOB: [birth year], 73 y.o.   MRN: [id number]    Brittany Chung is a 73 y.o. female who presents to the [facility] today with for DM follow up. Her concerns today include:  HPI:  DIABETES Type II Medications: Reports taking and tolerating without side effects. Diet-Trying to reduce carbs  Health Maintenance Due  Topic Date Due  . Ophthalmology Exam  [birth year]10/1951  . Mammogram  06/25/2009  . Influenza Vaccine  01/02/2014   On Aspirin-yes On statin-yes Daily foot monitoring-yes Last eye exam: never Last foot exam: 03/31/2013  ROS- Denies Polyuria,Polydipsia, nocturia, Vision changes, feet or hand numbness/pain/tingling. Denies  Hypoglycemia symptoms (shaky, sweaty, hungry, weak anxious, tremor, palpitations, confusion, behavior change).   Hemoglobin a1c:  Lab Results  Component Value Date   HGBA1C 6.8 03/26/2014   HGBA1C 8.4 10/21/2013   HGBA1C 8.7 03/30/2013   Stuttering Several year history, worsening past couple of years. Worse with stress. 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 Doing well now. Breathing better. Strength is back  Incontinence Long standing history. Requesting prescription for adult diapers.   Hearing Loss Several year history, left worse than right. Interested in getting hearing aid.    ROS: As per HPI, otherwise all systems reviewed and are negative.  Past Medical History - Reviewed and updated Patient Active Problem List   Diagnosis Date Noted  . Incontinence in female 03/26/2014  . Hearing loss 03/26/2014  . Mild cognitive impairment 02/23/2014  . Driving safety issue 02/23/2014  . Legionella pneumonia 02/09/2014  . Neck pain 01/05/2014  . Stuttering 10/21/2013  . S/P carotid endarterectomy 04/01/2013  . Restless leg syndrome 01/18/2012  . Vitamin D deficiency 01/18/2012  . Chronic narrow angle glaucoma 10/12/2011  . Right knee pain  09/20/2011  . Counseling regarding advanced directives 08/18/2010  . ADHD 03/16/2010  . BIPOLAR DISORDER UNSPECIFIED 06/22/2009  . HERPES, GENITAL NOS 02/24/2007  . Diabetes mellitus, type II 10/01/2006  . HYPERLIPIDEMIA 10/01/2006  . HYPERTENSION, BENIGN ESSENTIAL 10/01/2006  . Irritable bowel syndrome 09/19/2006    Medications- reviewed and updated Current Outpatient Prescriptions  Medication Sig Dispense Refill  . aspirin 81 MG chewable tablet Chew 81 mg by mouth every morning.       Marland Kitchen b complex vitamins tablet Take 1 tablet by mouth daily.      . bimatoprost (LUMIGAN) 0.03 % ophthalmic drops Place 1 drop into both eyes at bedtime.        . Coenzyme Q10 (CO Q-10) 100 MG CAPS Take by mouth.      . enalapril (VASOTEC) 2.5 MG tablet Take 1 tablet (2.5 mg total) by mouth daily.  90 tablet  3  . Glucos-MSM-C-Mn-Ginger-Willow (MSM GLUCOSAMINE COMPLEX PO) Take by mouth.      . Incontinence Supply Disposable (REALITY INCONTINENT BRIEFS SM) MISC Use as needed.  1 each  prn  . lisdexamfetamine (VYVANSE) 70 MG capsule Take 1 capsule (70 mg total) by mouth every morning.  30 capsule  0  . metFORMIN (GLUCOPHAGE) 850 MG tablet Take 1 tablet (850 mg total) by mouth 2 (two) times daily with a meal.  60 tablet  5  . mirtazapine (REMERON) 15 MG tablet TAKE 1 TABLET BY MOUTH AT BEDTIME  30 tablet  2  . Multiple Vitamins-Minerals (MULTIVITAMIN WITH MINERALS) tablet Take 1 tablet by mouth every evening.       . Omega-3 Fatty Acids (FISH OIL)  1000 MG CAPS Take 1 capsule by mouth daily.      Marland Kitchen rOPINIRole (REQUIP) 1 MG tablet TAKE 2 TABLETS (2 MG TOTAL) BY MOUTH AT BEDTIME.  180 tablet  3  . sertraline (ZOLOFT) 100 MG tablet TAKE 1 TABLET AND HALF EVERY DAY  45 tablet  2  . simvastatin (ZOCOR) 40 MG tablet TAKE 1 TABLET BY MOUTH AT BEDTIME  90 tablet  3  . sitaGLIPtin (JANUVIA) 100 MG tablet Take 1 tablet (100 mg total) by mouth daily.  30 tablet  2  . [DISCONTINUED] rOPINIRole (REQUIP) 1 MG tablet Take 1  tablet (1 mg total) by mouth at bedtime.  30 tablet  3   No current facility-administered medications for this visit.    Objective: Physical Exam: BP 141/77  Pulse 82  Temp(Src) 98.4 F (36.9 C) (Oral)  Ht 5\' 4"  (1.626 m)  Wt 143 lb (64.864 kg)  BMI 24.53 kg/m2  Gen: NAD, resting comfortably CV: RRR with no murmurs appreciated Lungs: NWOB, CTAB with no crackles, wheezes, or rhonchi Abdomen: Normal bowel sounds present. Soft, Nontender, Nondistended. Ext: no edema Skin: warm, dry Neuro: CN2-12 intact. Strength 5/5 in upper and lower extremities. No gross sensory deficits.   Results for orders placed in visit on 03/26/14 (from the past 72 hour(s))  POCT GLYCOSYLATED HEMOGLOBIN (HGB A1C)     Status: None   Collection Time    03/26/14  3:09 PM      Result Value Ref Range   Hemoglobin A1C 6.8      A/P: See problem list  Diabetes mellitus, type II Doing well on metformin and januvia, will continue.  A1C 6.8 today. Gave written prescription for blood glucose monitoring supplies, though informed patient that this was not medically necessary given her good glycemic control.   Stuttering Will refer to speech therapy. No signs of neurologic deficits on physical exam and worsened with stress and improved with focus, thus low likelihood of CVA. Additionally, no stroke on MRI from 2011.   Legionella pneumonia Resolved. Breathing and strength improved back to baseline.   Incontinence in female Long standing history. Has been using adult diapers. Will send prescription to [facility]. Can consider further work up if desired by patient.   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 Will refer to audiology for possible hearing aid placement.     Orders Placed This Encounter  Procedures  . Ambulatory referral to Speech Therapy    Referral Priority:  Routine    Referral Type:  Speech Therapy    Referral Reason:  Specialty Services Required     Requested Specialty:  Speech Pathology    Number of Visits Requested:  1  . Ambulatory referral to Audiology    Referral Priority:  Routine    Referral Type:  Audiology Exam    Referral Reason:  Specialty Services Required    Number of Visits Requested:  1  . POCT A1C    Meds ordered this encounter  Medications  . Incontinence Supply Disposable (REALITY INCONTINENT BRIEFS SM) MISC    Sig: Use as needed.    Dispense:  1 each    Refill:  prn     Kelilah Hebard M. Jerline Pain, [place] Resident PGY-1 03/26/2014 4:32 PM

## 2014-03-26 NOTE — Assessment & Plan Note (Signed)
Long standing history. Has been using adult diapers. Will send prescription to [facility]. Can consider further work up if desired by patient.

## 2014-04-05 ENCOUNTER — Other Ambulatory Visit: Payer: Self-pay | Admitting: Family Medicine

## 2014-04-05 ENCOUNTER — Encounter: Payer: Self-pay | Admitting: Family Medicine

## 2014-04-08 ENCOUNTER — Telehealth: Payer: Self-pay | Admitting: Family Medicine

## 2014-04-08 NOTE — Telephone Encounter (Signed)
Spoke with daughter [name] and i gave her number for [facility].  Also she would like to know if you can rewrite the rx for her incontinence pads.  They have yet to receive anything.  [facility].  She would like her number placed on it so they can call if they have questions.  [phone].

## 2014-04-08 NOTE — Telephone Encounter (Signed)
Daughter returned call.

## 2014-04-12 ENCOUNTER — Other Ambulatory Visit: Payer: Self-pay | Admitting: Family Medicine

## 2014-04-12 ENCOUNTER — Other Ambulatory Visit (HOSPITAL_COMMUNITY): Payer: Self-pay | Admitting: Psychiatry

## 2014-04-12 MED ORDER — REALITY INCONTINENT BRIEFS SM MISC: Status: DC

## 2014-04-12 NOTE — Telephone Encounter (Signed)
Prescription for incontinence supplies faxed in.  Algis Greenhouse. Jerline Pain, [place] Resident PGY-1 04/12/2014 9:25 PM

## 2014-04-12 NOTE — Telephone Encounter (Signed)
Requip refilled.   Algis Greenhouse. Jerline Pain, [place] Resident PGY-1 04/12/2014 9:28 PM

## 2014-04-13 ENCOUNTER — Telehealth: Payer: Self-pay | Admitting: Family Medicine

## 2014-04-13 NOTE — Telephone Encounter (Signed)
[facility] did not receive pt height, weigh, insurance info and diagnosis. This is incontinent supplies

## 2014-04-13 NOTE — Telephone Encounter (Signed)
[facility] called to report because Patient's insurance [facility]) they were not going to be able to get incontinence briefs.

## 2014-04-13 NOTE — Telephone Encounter (Signed)
Patient information faxed to [facility]. Jazmin Hartsell,CMA

## 2014-04-15 ENCOUNTER — Other Ambulatory Visit (HOSPITAL_COMMUNITY): Payer: Self-pay | Admitting: Psychiatry

## 2014-04-15 NOTE — Telephone Encounter (Signed)
Daughter of pt calls, the wrong incontinence supplies were ordered. Incontinence briefs were order and it should have been the incontinence underwear/panties size S/M. Pt also uses incontinence panty liners, underpad/dry pad for bedding, and aloe flush wipes. Daughter would also like to know if they should keep the briefs as a reserve or send them back? [facility] told them that it was up to them and PCP. Please advise.

## 2014-04-16 ENCOUNTER — Other Ambulatory Visit (HOSPITAL_COMMUNITY): Payer: Self-pay | Admitting: Psychiatry

## 2014-04-16 MED ORDER — INCONTINENCE SUPPLIES MISC: Status: DC

## 2014-04-16 NOTE — Telephone Encounter (Signed)
Faxed in another prescription for the supplies listed.  Algis Greenhouse. Jerline Pain, [place] Resident PGY-1 04/16/2014 4:07 PM

## 2014-04-16 NOTE — Telephone Encounter (Signed)
It was send e script. We have confirmation it was received on 03/06/14

## 2014-04-21 ENCOUNTER — Other Ambulatory Visit (HOSPITAL_COMMUNITY): Payer: Self-pay | Admitting: Psychiatry

## 2014-04-21 ENCOUNTER — Telehealth (HOSPITAL_COMMUNITY): Payer: Self-pay | Admitting: *Deleted

## 2014-04-21 DIAGNOSIS — F32A Depression, unspecified: Secondary | ICD-10-CM

## 2014-04-21 DIAGNOSIS — F329 Major depressive disorder, single episode, unspecified: Secondary | ICD-10-CM

## 2014-04-21 MED ORDER — MIRTAZAPINE 15 MG PO TABS: ORAL_TABLET | ORAL | Status: DC

## 2014-04-21 NOTE — Telephone Encounter (Signed)
Received LV:DIXVE refill of Mirtazapine.Pharmacy states denied from office (should be one on file), but they do not have an RX.Pt out of medicine.  Per chart review: RX sent 03/16/14 for #30 w/2 refills - received/confirmed by pharmacy on 03/16/14 @ 3:37pm. Contacted pharmacy: They have no record of ever receiving this prescription   Prescription resent as originally ordered by Dr. Adele Schilder. Patient notifed

## 2014-04-22 ENCOUNTER — Telehealth: Payer: Self-pay | Admitting: Family Medicine

## 2014-04-22 NOTE — Telephone Encounter (Signed)
Received prescriptions from [facility] for incontinence supplies. Filled out and faxed back.  Algis Greenhouse. Jerline Pain, [place] Resident PGY-1 04/22/2014 2:10 PM

## 2014-05-12 ENCOUNTER — Telehealth (HOSPITAL_COMMUNITY): Payer: Self-pay

## 2014-05-12 ENCOUNTER — Telehealth: Payer: Self-pay | Admitting: *Deleted

## 2014-05-12 ENCOUNTER — Other Ambulatory Visit (HOSPITAL_COMMUNITY): Payer: Self-pay | Admitting: Psychiatry

## 2014-05-12 DIAGNOSIS — F988 Other specified behavioral and emotional disorders with onset usually occurring in childhood and adolescence: Secondary | ICD-10-CM

## 2014-05-12 DIAGNOSIS — H402224 Chronic angle-closure glaucoma, left eye, indeterminate stage: Secondary | ICD-10-CM

## 2014-05-12 DIAGNOSIS — H402290 Chronic angle-closure glaucoma, unspecified eye, stage unspecified: Secondary | ICD-10-CM

## 2014-05-12 MED ORDER — LISDEXAMFETAMINE DIMESYLATE 70 MG PO CAPS: 70.0000 mg | ORAL_CAPSULE | ORAL | Status: DC

## 2014-05-12 NOTE — Telephone Encounter (Signed)
Pt left a voice message on nurse line stating she needed a referral to her ophthalmologist.  She has [employer]; ophthalmologist phone number [phone] Dr. Herbert Deaner.  Please call pt regarding this referral.  Derl Barrow, RN

## 2014-05-12 NOTE — Telephone Encounter (Signed)
Will forward to MD to place referral for this.  DX:  DM2

## 2014-05-13 NOTE — Telephone Encounter (Signed)
Referral to ophthalmology placed.  Algis Greenhouse. Jerline Pain, [place] Resident PGY-1 05/13/2014 1:33 PM

## 2014-05-13 NOTE — Addendum Note (Signed)
Addended by: Vivi Barrack on: 05/13/2014 01:33 PM   Modules accepted: Orders

## 2014-05-17 ENCOUNTER — Telehealth (HOSPITAL_COMMUNITY): Payer: Self-pay

## 2014-05-17 ENCOUNTER — Other Ambulatory Visit: Payer: Self-pay | Admitting: *Deleted

## 2014-05-17 MED ORDER — ENALAPRIL MALEATE 2.5 MG PO TABS: 2.5000 mg | ORAL_TABLET | Freq: Every day | ORAL | Status: DC

## 2014-05-19 ENCOUNTER — Other Ambulatory Visit (HOSPITAL_COMMUNITY): Payer: Self-pay | Admitting: Psychiatry

## 2014-05-19 DIAGNOSIS — F988 Other specified behavioral and emotional disorders with onset usually occurring in childhood and adolescence: Secondary | ICD-10-CM

## 2014-05-19 MED ORDER — LISDEXAMFETAMINE DIMESYLATE 70 MG PO CAPS: 70.0000 mg | ORAL_CAPSULE | ORAL | Status: DC

## 2014-05-19 NOTE — Progress Notes (Signed)
Patient came without appointment requesting to refill her Vyvanse.  She lost the prescription.  She went to police to file a report but police decline as medications was not stolen.  Explain control substance abuse and encouraged to keep her prescription in a safe box .  We will provide a new prescription of Vyvanse however in the future no early refills.

## 2014-06-16 ENCOUNTER — Encounter (HOSPITAL_COMMUNITY): Payer: Self-pay | Admitting: Psychiatry

## 2014-06-16 ENCOUNTER — Ambulatory Visit (INDEPENDENT_AMBULATORY_CARE_PROVIDER_SITE_OTHER): Payer: Commercial Managed Care - HMO | Admitting: Psychiatry

## 2014-06-16 VITALS — BP 129/70 | HR 90 | Ht 63.5 in | Wt 146.4 lb

## 2014-06-16 DIAGNOSIS — F909 Attention-deficit hyperactivity disorder, unspecified type: Secondary | ICD-10-CM | POA: Diagnosis not present

## 2014-06-16 DIAGNOSIS — F32A Depression, unspecified: Secondary | ICD-10-CM

## 2014-06-16 DIAGNOSIS — F329 Major depressive disorder, single episode, unspecified: Secondary | ICD-10-CM | POA: Diagnosis not present

## 2014-06-16 DIAGNOSIS — F988 Other specified behavioral and emotional disorders with onset usually occurring in childhood and adolescence: Secondary | ICD-10-CM

## 2014-06-16 MED ORDER — SERTRALINE HCL 100 MG PO TABS: ORAL_TABLET | ORAL | Status: DC

## 2014-06-16 MED ORDER — LISDEXAMFETAMINE DIMESYLATE 70 MG PO CAPS: 70.0000 mg | ORAL_CAPSULE | ORAL | Status: DC

## 2014-06-16 MED ORDER — MIRTAZAPINE 15 MG PO TABS: ORAL_TABLET | ORAL | Status: DC

## 2014-06-16 NOTE — Progress Notes (Signed)
[facility] [phone] Progress Note  Brittany Chung [id number] 74 y.o.  06/16/2014 10:33 AM  Chief Complaint:  Medication management and followup.      History of Present Illness:  Brittany Chung came for her followup appointment.  She had a good Christmas.  She enjoys spending time with her 24 grandkids.  She is taking Zoloft, Remeron and Vyvanse.  She denies any side effects including any shakes tremors or any chest pain.  She sleeping good.  Her attention and focus is good.  She is able to do multitasking.  She saw her primary care physician and she is happy that her hemoglobin A1c is improved from the past.  Her appetite is okay.  Her vitals are stable.  Patient lives with her daughter and her 5 grandkids.    Suicidal Ideation: No Plan Formed: No Patient has means to carry out plan: No  Homicidal Ideation: No Plan Formed: No Patient has means to carry out plan: No  Medical History; Patient has history of irregular heartbeat, stroke, cardiac stenosis, hypertension.  Past Psychiatric History/Hospitalization(s) Patient denies any history of suicidal attempt, inpatient psychiatric treatment of any psychosis.  She told that she was diagnosed with bipolar disorder but denies any history of mania.  She was given Seroquel by her primary care physician but she stopped taking it because of sedation.   Anxiety: Yes Bipolar Disorder: She mentioned diagnosed with bipolar disorder but denies any mania.  We do not have any clear history of mania. Depression: Yes Mania: No Psychosis: No Schizophrenia: No Personality Disorder: No Hospitalization for psychiatric illness: No History of Electroconvulsive Shock Therapy: No Prior Suicide Attempts: No   Review of Systems: Psychiatric: Agitation: No Hallucination: No Depressed Mood: No Insomnia: No Hypersomnia: No Altered Concentration: No Feels Worthless: No Grandiose Ideas: No Belief In Special Powers: No New/Increased Substance  Abuse: No Compulsions: No  Neurologic: Headache: No Seizure: No Paresthesias: No    Outpatient Encounter Prescriptions as of 06/16/2014  Medication Sig  . aspirin 81 MG chewable tablet Chew 81 mg by mouth every morning.   Marland Kitchen b complex vitamins tablet Take 1 tablet by mouth daily.  . bimatoprost (LUMIGAN) 0.03 % ophthalmic drops Place 1 drop into both eyes at bedtime.    . Coenzyme Q10 (CO Q-10) 100 MG CAPS Take by mouth.  . enalapril (VASOTEC) 2.5 MG tablet Take 1 tablet (2.5 mg total) by mouth daily.  Donnie Aho (MSM GLUCOSAMINE COMPLEX PO) Take by mouth.  . Incontinence Supplies MISC Use as directed  . Incontinence Supply Disposable (REALITY INCONTINENT BRIEFS SM) MISC Use as needed.  Marland Kitchen JANUVIA 100 MG tablet TAKE 1 TABLET BY MOUTH EVERY DAY  . lisdexamfetamine (VYVANSE) 70 MG capsule Take 1 capsule (70 mg total) by mouth every morning. Patient lost her prescription. Please check before refill.  . metFORMIN (GLUCOPHAGE) 850 MG tablet Take 1 tablet (850 mg total) by mouth 2 (two) times daily with a meal.  . mirtazapine (REMERON) 15 MG tablet TAKE 1 TABLET BY MOUTH AT BEDTIME  . Multiple Vitamins-Minerals (MULTIVITAMIN WITH MINERALS) tablet Take 1 tablet by mouth every evening.   . Omega-3 Fatty Acids (FISH OIL) 1000 MG CAPS Take 1 capsule by mouth daily.  Marland Kitchen rOPINIRole (REQUIP) 1 MG tablet TAKE 2 TABLETS BY MOUTH AT BEDTIME  . sertraline (ZOLOFT) 100 MG tablet TAKE 1 TABLET AND HALF EVERY DAY  . simvastatin (ZOCOR) 40 MG tablet TAKE 1 TABLET BY MOUTH AT BEDTIME  . [DISCONTINUED] lisdexamfetamine (VYVANSE) 70  MG capsule Take 1 capsule (70 mg total) by mouth every morning. Patient lost her prescription. Please check before refill.  . [DISCONTINUED] lisdexamfetamine (VYVANSE) 70 MG capsule Take 1 capsule (70 mg total) by mouth every morning. Patient lost her prescription. Please check before refill.  . [DISCONTINUED] lisdexamfetamine (VYVANSE) 70 MG capsule Take 1  capsule (70 mg total) by mouth every morning. Patient lost her prescription. Please check before refill.  . [DISCONTINUED] mirtazapine (REMERON) 15 MG tablet TAKE 1 TABLET BY MOUTH AT BEDTIME  . [DISCONTINUED] sertraline (ZOLOFT) 100 MG tablet TAKE 1 TABLET AND HALF EVERY DAY    Recent Results (from the past 2160 hour(s))  POCT A1C     Status: None   Collection Time: 03/26/14  3:09 PM  Result Value Ref Range   Hemoglobin A1C 6.8       Physical Exam: Constitutional:  BP 129/70 mmHg  Pulse 90  Ht 5' 3.5" (1.613 m)  Wt 146 lb 6.4 oz (66.407 kg)  BMI 25.52 kg/m2  Musculoskeletal: Strength & Muscle Tone: within normal limits Gait & Station: normal Patient leans: Patient has a normal posture  Mental Status Examination;  Patient is well groomed well dressed female who appears to be her stated age.  Her speech is fast but coherent.  Her thought processes logical and goal directed.  She describes her mood pleasant and her affect is appropriate. She denies any auditory or visual hallucination.  She denies any active or passive suicidal thoughts or homicidal thoughts.  There were no paranoia or any delusions present at this time.  Her fund of knowledge is average.  Her thought process is logical.  Her psychomotor activity is normal. Her attention and concentration is fair.  She is alert and oriented x3.  Her insight judgment and impulse control is okay.   Established Problem, Stable/Improving (1), Review of Psycho-Social Stressors (1), Review or order clinical lab tests (1), Review of Last Therapy Session (1) and Review of Medication Regimen & Side Effects (2)  Assessment: Axis I: ADD by history, depressive disorder NOS  Axis II: Deferred  Axis III:  Past Medical History  Diagnosis Date  . Irregular heart beat   . Depression   . Stroke 2006  . Cancer     skin  . Carotid stenosis     s/p R CEA 2006  . Cataract     s/p surgery 2007    Axis IV: Mild   Plan:  Patient is doing  better on her current medication.  Her hemoglobin A1c is dropped from 8.4 to-6.8.  She denies any side effects of medication.  I will continue Vyvanse 70 mg daily, Remeron 15 mg at bedtime and Zoloft 150 mg daily.  Discussed medication side effects and benefits.  Recommended to call us back if she has any question or any concern.  Follow-up in 3 months.    Jadin Creque T., MD 06/16/2014

## 2014-06-18 ENCOUNTER — Other Ambulatory Visit: Payer: Self-pay | Admitting: Family Medicine

## 2014-06-18 ENCOUNTER — Other Ambulatory Visit (HOSPITAL_COMMUNITY): Payer: Self-pay | Admitting: Psychiatry

## 2014-06-21 ENCOUNTER — Other Ambulatory Visit (HOSPITAL_COMMUNITY): Payer: Self-pay | Admitting: Psychiatry

## 2014-06-21 NOTE — Telephone Encounter (Signed)
Given last week with 2 refills

## 2014-06-22 ENCOUNTER — Other Ambulatory Visit: Payer: Self-pay | Admitting: Family Medicine

## 2014-06-22 NOTE — Telephone Encounter (Signed)
Rx for Januvia filled yesterday.  Algis Greenhouse. Jerline Pain, [place] Resident PGY-1 06/22/2014 1:32 PM

## 2014-06-29 ENCOUNTER — Telehealth: Payer: Self-pay | Admitting: Family Medicine

## 2014-06-29 NOTE — Telephone Encounter (Signed)
Dr Herbert Deaner needs another referral. Has to be from a dr not a resident. Pt needs the referral to receive her eyedrops. Please advise

## 2014-06-29 NOTE — Telephone Encounter (Signed)
Will reactivate referral for ophthalmology to see if pcp has changed on card. Jazmin Hartsell,CMA

## 2014-06-30 NOTE — Telephone Encounter (Signed)
Referral placed online and new pcp was from our office.  LM with Dr. Payton Emerald office that this has been taken care of. [facility]

## 2014-08-14 ENCOUNTER — Other Ambulatory Visit: Payer: Self-pay | Admitting: Family Medicine

## 2014-08-16 NOTE — Telephone Encounter (Signed)
Letter mailed to patient. Jazmin Hartsell,CMA  

## 2014-08-19 ENCOUNTER — Other Ambulatory Visit: Payer: Self-pay | Admitting: Family Medicine

## 2014-08-19 ENCOUNTER — Other Ambulatory Visit (HOSPITAL_COMMUNITY): Payer: Self-pay | Admitting: Psychiatry

## 2014-08-19 MED ORDER — SIMVASTATIN 40 MG PO TABS: 40.0000 mg | ORAL_TABLET | Freq: Every day | ORAL | Status: DC

## 2014-08-20 NOTE — Telephone Encounter (Signed)
Medication refill request received too early to authorize.  Patient had order for Zoloft e-scribed 06/16/14 plus 2 refills and returns for evaluation on 09/16/14.

## 2014-09-16 ENCOUNTER — Encounter (HOSPITAL_COMMUNITY): Payer: Self-pay | Admitting: Psychiatry

## 2014-09-16 ENCOUNTER — Ambulatory Visit (INDEPENDENT_AMBULATORY_CARE_PROVIDER_SITE_OTHER): Payer: Medicare HMO | Admitting: Psychiatry

## 2014-09-16 VITALS — BP 118/67 | HR 78 | Ht 63.5 in | Wt 148.6 lb

## 2014-09-16 DIAGNOSIS — F988 Other specified behavioral and emotional disorders with onset usually occurring in childhood and adolescence: Secondary | ICD-10-CM

## 2014-09-16 DIAGNOSIS — F329 Major depressive disorder, single episode, unspecified: Secondary | ICD-10-CM | POA: Diagnosis not present

## 2014-09-16 DIAGNOSIS — F909 Attention-deficit hyperactivity disorder, unspecified type: Secondary | ICD-10-CM | POA: Diagnosis not present

## 2014-09-16 DIAGNOSIS — F310 Bipolar disorder, current episode hypomanic: Secondary | ICD-10-CM

## 2014-09-16 DIAGNOSIS — F32A Depression, unspecified: Secondary | ICD-10-CM

## 2014-09-16 MED ORDER — LISDEXAMFETAMINE DIMESYLATE 40 MG PO CAPS: 40.0000 mg | ORAL_CAPSULE | ORAL | Status: DC

## 2014-09-16 MED ORDER — SERTRALINE HCL 100 MG PO TABS: 100.0000 mg | ORAL_TABLET | Freq: Every day | ORAL | Status: DC

## 2014-09-16 MED ORDER — QUETIAPINE FUMARATE 50 MG PO TABS: 50.0000 mg | ORAL_TABLET | Freq: Every day | ORAL | Status: DC

## 2014-09-16 NOTE — Progress Notes (Signed)
[facility] [phone] Progress Note  Brittany Chung [id number] 73 y.o.  09/16/2014 2:35 PM  Chief Complaint:  I'm not sleeping well.        History of Present Illness:  Brittany Chung came for her followup appointment with her daughter.  Her daughter is very concerned about patient's behavior.  She mentioned that mother is not telling everything psychiatrist.  The past few months she's been experiencing irritability, anger, mood swing and forgetful.  She is going into phase of mania but she described unable to sleep, excessive shopping and buying and getting forgetful.  Her daughter endorsed that she had bought so many things at home the patient has noted collection and now she is having financial debt.  She is getting easily irritable and argumentative.  Patient admitted these symptoms but apologize not telling this writer in the past.  Daughter is also concerned because sometimes patient keeps grandson for few hours and there are time when she goes with her grandson outside and does not come back until very late.  Patient told that she used to go for shopping and there has been no violence or aggression or any abusive behavior.  When I ask the patient that why she did not inform this before patient apologize and mention that she felt that she is doing good.  She also admitted feeling embarrassed and in denial to accepting bipolar disorder.  Patient denies any paranoia, hallucination, suicidal thoughts or homicidal thought.  She recalled giving Seroquel by primary care physician however she stopped because she does not like to take Seroquel.  Her daughter endorse that patient has ADD but currently she is more manic and not sleeping.  Her attention and concentration is distracted.  Patient denies drinking or using any illegal substances.  She lives with her daughter and 5 grandkids.  Suicidal Ideation: No Plan Formed: No Patient has means to carry out plan: No  Homicidal Ideation: No Plan Formed:  No Patient has means to carry out plan: No  Medical History; Patient has history of irregular heartbeat, stroke, cardiac stenosis, hypertension.  Past Psychiatric History/Hospitalization(s) Patient denies any history of suicidal attempt, inpatient psychiatric treatment of any psychosis.  As per daughter she has history of bipolar disorder but she feels embarrassed to accept it.  She was given Seroquel by her primary care physician but she stopped taking it because of sedation.   Anxiety: Yes Bipolar Disorder: Yes Depression: Yes Mania: No Psychosis: No Schizophrenia: No Personality Disorder: No Hospitalization for psychiatric illness: No History of Electroconvulsive Shock Therapy: No Prior Suicide Attempts: No   Review of Systems  Constitutional: Negative.   Neurological: Positive for headaches. Negative for tremors.  Psychiatric/Behavioral: The patient is nervous/anxious and has insomnia.        Irritability    Psychiatric: Agitation: Irritability Hallucination: No Depressed Mood: No Insomnia: Yes Hypersomnia: No Altered Concentration: No Feels Worthless: No Grandiose Ideas: No Belief In Special Powers: No New/Increased Substance Abuse: No Compulsions: No  Neurologic: Headache: No Seizure: No Paresthesias: No    Outpatient Encounter Prescriptions as of 09/16/2014  Medication Sig  . aspirin 81 MG chewable tablet Chew 81 mg by mouth every morning.   Marland Kitchen b complex vitamins tablet Take 1 tablet by mouth daily.  . bimatoprost (LUMIGAN) 0.03 % ophthalmic drops Place 1 drop into both eyes at bedtime.    . Coenzyme Q10 (CO Q-10) 100 MG CAPS Take by mouth.  . enalapril (VASOTEC) 2.5 MG tablet Take 1 tablet (2.5 mg  total) by mouth daily.  Donnie Aho (MSM GLUCOSAMINE COMPLEX PO) Take by mouth.  . Incontinence Supplies MISC Use as directed  . Incontinence Supply Disposable (REALITY INCONTINENT BRIEFS SM) MISC Use as needed.  Marland Kitchen JANUVIA 100 MG tablet TAKE  1 TABLET BY MOUTH EVERY DAY  . lisdexamfetamine (VYVANSE) 40 MG capsule Take 1 capsule (40 mg total) by mouth every morning.  . metFORMIN (GLUCOPHAGE) 850 MG tablet TAKE 1 TABLET (850 MG TOTAL) BY MOUTH 2 (TWO) TIMES DAILY WITH A MEAL.  . Multiple Vitamins-Minerals (MULTIVITAMIN WITH MINERALS) tablet Take 1 tablet by mouth every evening.   . Omega-3 Fatty Acids (FISH OIL) 1000 MG CAPS Take 1 capsule by mouth daily.  . QUEtiapine (SEROQUEL) 50 MG tablet Take 1 tablet (50 mg total) by mouth at bedtime.  Marland Kitchen rOPINIRole (REQUIP) 1 MG tablet TAKE 2 TABLETS BY MOUTH AT BEDTIME  . sertraline (ZOLOFT) 100 MG tablet Take 1 tablet (100 mg total) by mouth daily.  . simvastatin (ZOCOR) 40 MG tablet Take 1 tablet (40 mg total) by mouth at bedtime.  . [DISCONTINUED] JANUVIA 100 MG tablet TAKE 1 TABLET BY MOUTH EVERY DAY  . [DISCONTINUED] lisdexamfetamine (VYVANSE) 70 MG capsule Take 1 capsule (70 mg total) by mouth every morning. Patient lost her prescription. Please check before refill.  . [DISCONTINUED] metFORMIN (GLUCOPHAGE) 850 MG tablet TAKE 1 TABLET (850 MG TOTAL) BY MOUTH 2 (TWO) TIMES DAILY WITH A MEAL.  . [DISCONTINUED] mirtazapine (REMERON) 15 MG tablet TAKE 1 TABLET BY MOUTH AT BEDTIME  . [DISCONTINUED] sertraline (ZOLOFT) 100 MG tablet TAKE 1 TABLET AND HALF EVERY DAY    No results found for this or any previous visit (from the past 2160 hour(s)).    Physical Exam: Constitutional:  BP 118/67 mmHg  Pulse 78  Ht 5' 3.5" (1.613 m)  Wt 148 lb 9.6 oz (67.405 kg)  BMI 25.91 kg/m2  Musculoskeletal: Strength & Muscle Tone: within normal limits Gait & Station: normal Patient leans: Patient has a normal posture  Mental Status Examination;  Patient is groomed and well dressed female who appears to be her stated age.  Her speech is fast but coherent.  She described her mood anxious and her affect is labile.  Her attention concentration is distracted.  She denies any auditory or visual  hallucination.  She denies any active or passive suicidal thoughts or homicidal thoughts.  There were no paranoia or any delusions present at this time.  Her fund of knowledge is average.  Her thought process is logical.  Her psychomotor activity is slightly increased.  Her attention and concentration is fair.  She is alert and oriented x3.  Her insight judgment and impulse control is okay.   Established Problem, Stable/Improving (1), New problem, with additional work up planned, Review of Psycho-Social Stressors (1), Review or order clinical lab tests (1), Established Problem, Worsening (2), New Problem, with no additional work-up planned (3), Review of Last Therapy Session (1), Review of Medication Regimen & Side Effects (2) and Review of New Medication or Change in Dosage (2)  Assessment: Axis I: ADD by history, depressive disorder NOS  Axis II: Deferred  Axis III:  Past Medical History  Diagnosis Date  . Irregular heart beat   . Depression   . Stroke 2006  . Cancer     skin  . Carotid stenosis     s/p R CEA 2006  . Cataract     s/p surgery 2007   Plan:  I had a  long discussion with the patient and her daughter about her underlying psychiatric illness.  I do believe patient is exhibiting mania which could be due to increase stimulant, increase antidepressant and not taking mood stabilizer.  I will reduce Vyvanse 40 mg as patient is still needed to help her ADD .  I will reduce Zoloft 100 mg to see if antidepressant not contributing to mania.  We will discontinue Remeron and try Seroquel 50 mg at bedtime.  Her daughter believes she was given 100 mg in the past however given the history of hypoglycemia and coronary artery disease we will see if low-dose antipsychotic medication may help her.  I also encouraged to call her primary care physician and provide update on her medication list.  Recommended to call us back if she has any question or any concern.  Her last hemoglobin A1c was 6.8.  I  will see her again in 3-4 weeks.  Time spent 25 minutes.  More than 50% of the time spent in psychoeducation, counseling and coordination of care.  Discuss safety plan that anytime having active suicidal thoughts or homicidal thoughts then patient need to call 911 or go to the local emergency room.   Ambika Zettlemoyer T., MD 09/16/2014

## 2014-09-18 ENCOUNTER — Other Ambulatory Visit (HOSPITAL_COMMUNITY): Payer: Self-pay | Admitting: Psychiatry

## 2014-09-18 ENCOUNTER — Other Ambulatory Visit: Payer: Self-pay | Admitting: Family Medicine

## 2014-09-20 NOTE — Telephone Encounter (Signed)
Refill request for patient's Zoloft declined at this time due to requested too early.   Patient had a order e-scribed 09/16/14 and returns for follow up appointment on 10/08/14.

## 2014-09-28 ENCOUNTER — Encounter: Payer: Self-pay | Admitting: Family Medicine

## 2014-09-28 ENCOUNTER — Ambulatory Visit (INDEPENDENT_AMBULATORY_CARE_PROVIDER_SITE_OTHER): Payer: Commercial Managed Care - HMO | Admitting: Family Medicine

## 2014-09-28 VITALS — BP 130/51 | HR 84 | Temp 98.5°F | Ht 63.5 in | Wt 153.5 lb

## 2014-09-28 DIAGNOSIS — G2581 Restless legs syndrome: Secondary | ICD-10-CM

## 2014-09-28 DIAGNOSIS — I10 Essential (primary) hypertension: Secondary | ICD-10-CM | POA: Diagnosis not present

## 2014-09-28 DIAGNOSIS — Z0000 Encounter for general adult medical examination without abnormal findings: Secondary | ICD-10-CM

## 2014-09-28 DIAGNOSIS — Z23 Encounter for immunization: Secondary | ICD-10-CM | POA: Diagnosis not present

## 2014-09-28 DIAGNOSIS — E119 Type 2 diabetes mellitus without complications: Secondary | ICD-10-CM

## 2014-09-28 LAB — CBC
HCT: 34.3 % — ABNORMAL LOW (ref 36.0–46.0)
Hemoglobin: 11.5 g/dL — ABNORMAL LOW (ref 12.0–15.0)
MCH: 29 pg (ref 26.0–34.0)
MCHC: 33.5 g/dL (ref 30.0–36.0)
MCV: 86.6 fL (ref 78.0–100.0)
MPV: 10.7 fL (ref 8.6–12.4)
Platelets: 250 10*3/uL (ref 150–400)
RBC: 3.96 MIL/uL (ref 3.87–5.11)
RDW: 14 % (ref 11.5–15.5)
WBC: 10.3 10*3/uL (ref 4.0–10.5)

## 2014-09-28 LAB — BASIC METABOLIC PANEL
BUN: 22 mg/dL (ref 6–23)
CALCIUM: 9.6 mg/dL (ref 8.4–10.5)
CHLORIDE: 104 meq/L (ref 96–112)
CO2: 24 mEq/L (ref 19–32)
Creat: 1.14 mg/dL — ABNORMAL HIGH (ref 0.50–1.10)
GLUCOSE: 182 mg/dL — AB (ref 70–99)
POTASSIUM: 5.4 meq/L — AB (ref 3.5–5.3)
Sodium: 141 mEq/L (ref 135–145)

## 2014-09-28 LAB — LIPID PANEL
CHOLESTEROL: 180 mg/dL (ref 0–200)
HDL: 67 mg/dL (ref >=46)
LDL CALC: 85 mg/dL (ref 0–99)
TRIGLYCERIDES: 140 mg/dL (ref <150)
Total CHOL/HDL Ratio: 2.7 Ratio
VLDL: 28 mg/dL (ref 0–40)

## 2014-09-28 LAB — POCT GLYCOSYLATED HEMOGLOBIN (HGB A1C): Hemoglobin A1C: 7.1

## 2014-09-28 MED ORDER — ROPINIROLE HCL 1 MG PO TABS: 3.0000 mg | ORAL_TABLET | Freq: Every day | ORAL | Status: DC

## 2014-09-28 NOTE — Assessment & Plan Note (Signed)
PNA vaccine today. Referral for colonoscopy and mammograms (strong family history of breast cancer) sent.

## 2014-09-28 NOTE — Assessment & Plan Note (Signed)
Increased Requip to 3mg  nightly. If still poorly controlled, can increase to 4mg  nightly. Will check CBC today to rule out Fe deficiency.

## 2014-09-28 NOTE — Patient Instructions (Signed)
Thank you for coming to the clinic today. It was nice seeing you.  For your restless legs, we will increase your requip to 3mg  daily.  For your Blood pressure, continue taking the enalapril  For your diabetes, your A1c today was 7.1. It is important to continue to take your medications as prescribed. We will not make any changes to your medications today. Please try to avoid sugary foods.  We gave you a pneumonia vaccine today.  We also referred you to have your mammogram and colonoscopy.  We will see you again in 3-6 months.

## 2014-09-28 NOTE — Assessment & Plan Note (Signed)
A1c 7.1 today likely secondary to missed doses of oral agents. Re-inforced adherence to medications and avoidance of high glycemic foods. Will recheck in 3-6 months. Pneumonia vaccine. Will need foot exam at next visit. Will also need eye exam soon. On ASA and statin.

## 2014-09-28 NOTE — Progress Notes (Signed)
Brittany Chung is a 74 y.o. female who presents to the [facility] today with a chief complaint of follow up. Her concerns today include:  HPI:  Restless Legs Patient currently on Requip 2mg  nightly. States that it helps with symptoms somewhat, though she continues to has a restless sensation, especially when she is in a more manic mood.  Hypertension BP Readings from Last 3 Encounters:  09/28/14 130/51  09/16/14 118/67  06/16/14 129/70   Patient reports checking BP at home. Usually runs in 130-140s/80s range. Daughter reports that patient often forgets to take medications and has missed up to 2-3 doses per week. Daughter states that she has a new strategy with administering medications daily that has worked better (previously put them in a pill box for a week)  ROS-Denies any CP, HA, SOB, blurry vision, LE edema, transient weakness, orthopnea, PND.   T2DM Patient has been taking metformin and Januvia with no side effects. Misses doses as noted above. No polyuria or polydipsia.  ROS- Denies Polyuria,Polydipsia, nocturia, Vision changes, feet or hand numbness/pain/tingling. Denies  Hypoglycemia symptoms (shaky, sweaty, hungry, weak anxious, tremor, palpitations, confusion, behavior change).   Hemoglobin a1c:  Lab Results  Component Value Date   HGBA1C 7.1 09/28/2014   HGBA1C 6.8 03/26/2014   HGBA1C 8.4 10/21/2013     ROS: As per HPI, otherwise all systems reviewed and are negative.  Past Medical History - Reviewed and updated Patient Active Problem List   Diagnosis Date Noted  . Healthcare maintenance 09/28/2014  . Incontinence in female 03/26/2014  . Hearing loss 03/26/2014  . Mild cognitive impairment 02/23/2014  . Driving safety issue 02/23/2014  . Legionella pneumonia 02/09/2014  . Neck pain 01/05/2014  . Stuttering 10/21/2013  . S/P carotid endarterectomy 04/01/2013  . Restless leg syndrome 01/18/2012  . Vitamin D deficiency 01/18/2012  . 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 Right knee pain 09/20/2011  . Counseling regarding advanced directives 08/18/2010  . ADHD 03/16/2010  . BIPOLAR DISORDER UNSPECIFIED 06/22/2009  . HERPES, GENITAL NOS 02/24/2007  . Diabetes mellitus, type II 10/01/2006  . HYPERLIPIDEMIA 10/01/2006  . HYPERTENSION, BENIGN ESSENTIAL 10/01/2006  . Irritable bowel syndrome 09/19/2006    Medications- reviewed and updated Current Outpatient Prescriptions  Medication Sig Dispense Refill  . aspirin 81 MG chewable tablet Chew 81 mg by mouth every morning.     Marland Kitchen b complex vitamins tablet Take 1 tablet by mouth daily.    . bimatoprost (LUMIGAN) 0.03 % ophthalmic drops Place 1 drop into both eyes at bedtime.      . Coenzyme Q10 (CO Q-10) 100 MG CAPS Take by mouth.    . enalapril (VASOTEC) 2.5 MG tablet Take 1 tablet (2.5 mg total) by mouth daily. 90 tablet 3  . Glucos-MSM-C-Mn-Ginger-Willow (MSM GLUCOSAMINE COMPLEX PO) Take by mouth.    . Incontinence Supplies MISC Use as directed 1 each prn  . Incontinence Supply Disposable (REALITY INCONTINENT BRIEFS SM) MISC Use as needed. 1 each prn  . JANUVIA 100 MG tablet TAKE 1 TABLET BY MOUTH EVERY DAY 30 tablet 2  . lisdexamfetamine (VYVANSE) 40 MG capsule Take 1 capsule (40 mg total) by mouth every morning. 30 capsule 0  . metFORMIN (GLUCOPHAGE) 850 MG tablet TAKE 1 TABLET (850 MG TOTAL) BY MOUTH 2 (TWO) TIMES DAILY WITH A MEAL. 60 tablet 5  . Multiple Vitamins-Minerals (MULTIVITAMIN WITH MINERALS) tablet Take 1 tablet by mouth every evening.     . Omega-3 Fatty Acids (FISH OIL) 1000  MG CAPS Take 1 capsule by mouth daily.    . QUEtiapine (SEROQUEL) 50 MG tablet Take 1 tablet (50 mg total) by mouth at bedtime. 30 tablet 0  . rOPINIRole (REQUIP) 1 MG tablet Take 3 tablets (3 mg total) by mouth at bedtime. 180 tablet 3  . sertraline (ZOLOFT) 100 MG tablet Take 1 tablet (100 mg total) by mouth daily. 30 tablet 0  . simvastatin (ZOCOR) 40 MG tablet Take 1 tablet (40 mg total) by mouth  at bedtime. 90 tablet 3  . [DISCONTINUED] rOPINIRole (REQUIP) 1 MG tablet Take 1 tablet (1 mg total) by mouth at bedtime. 30 tablet 3   No current facility-administered medications for this visit.    Objective: Physical Exam: BP 130/51 mmHg  Pulse 84  Temp(Src) 98.5 F (36.9 C) (Oral)  Ht 5' 3.5" (1.613 m)  Wt 153 lb 8 oz (69.627 kg)  BMI 26.76 kg/m2  Gen: NAD, resting comfortably CV: RRR with no murmurs appreciated Lungs: NWOB, CTAB with no crackles, wheezes, or rhonchi Abdomen: Normal bowel sounds present. Soft, Nontender, Nondistended. Ext: no edema Skin: warm, dry Neuro: grossly normal, moves all extremities  Results for orders placed or performed in visit on 09/28/14 (from the past 72 hour(s))  POCT HgB A1C     Status: None   Collection Time: 09/28/14  4:35 PM  Result Value Ref Range   Hemoglobin A1C 7.1     A/P: See problem list  Restless leg syndrome Increased Requip to 3mg  nightly. If still poorly controlled, can increase to 4mg  nightly. Will check CBC today to rule out Fe deficiency.    HYPERTENSION, BENIGN ESSENTIAL Well controlled on enalapril. Will continue. Will check BMP today.    Diabetes mellitus, type II A1c 7.1 today likely secondary to missed doses of oral agents. Re-inforced adherence to medications and avoidance of high glycemic foods. Will recheck in 3-6 months. Pneumonia vaccine. Will need foot exam at next visit. Will also need eye exam soon. On ASA and statin.    Healthcare maintenance PNA vaccine today. Referral for colonoscopy and mammograms (strong family history of breast cancer) sent.     Orders Placed This Encounter  Procedures  . MM DIGITAL SCREENING BILATERAL    Standing Status: Future     Number of Occurrences:      Standing Expiration Date: 11/28/2015    Order Specific Question:  Reason for Exam (SYMPTOM  OR DIAGNOSIS REQUIRED)    Answer:  breast cancer screening    Order Specific Question:  Preferred imaging location?     Answer:  [facility]  . Pneumococcal polysaccharide vaccine 23-valent greater than or equal to 2yo subcutaneous/IM  . CBC  . Basic metabolic panel  . Lipid Panel  . Ambulatory referral to Gastroenterology    Referral Priority:  Routine    Referral Type:  Consultation    Referral Reason:  Specialty Services Required    Requested Specialty:  Gastroenterology    Number of Visits Requested:  1  . POCT HgB A1C    Meds ordered this encounter  Medications  . rOPINIRole (REQUIP) 1 MG tablet    Sig: Take 3 tablets (3 mg total) by mouth at bedtime.    Dispense:  180 tablet    Refill:  3     Shalice Woodring M. Jerline Pain, [place] Resident PGY-1 09/28/2014 5:37 PM

## 2014-09-28 NOTE — Assessment & Plan Note (Signed)
Well controlled on enalapril. Will continue. Will check BMP today.

## 2014-10-01 ENCOUNTER — Encounter: Payer: Self-pay | Admitting: Family Medicine

## 2014-10-01 NOTE — Progress Notes (Signed)
Addendum: Results reviewed. Would likely benefit from increasing statin to high intensity. Can discuss at next appointment. Letter sent.   Algis Greenhouse. Jerline Pain, [place] Resident PGY-1 10/01/2014 11:42 AM

## 2014-10-08 ENCOUNTER — Encounter (HOSPITAL_COMMUNITY): Payer: Self-pay | Admitting: Psychiatry

## 2014-10-08 ENCOUNTER — Other Ambulatory Visit (HOSPITAL_COMMUNITY): Payer: Self-pay

## 2014-10-08 ENCOUNTER — Ambulatory Visit (INDEPENDENT_AMBULATORY_CARE_PROVIDER_SITE_OTHER): Payer: Medicare HMO | Admitting: Psychiatry

## 2014-10-08 VITALS — BP 113/65 | HR 82 | Ht 63.5 in | Wt 151.4 lb

## 2014-10-08 DIAGNOSIS — F329 Major depressive disorder, single episode, unspecified: Secondary | ICD-10-CM

## 2014-10-08 DIAGNOSIS — F310 Bipolar disorder, current episode hypomanic: Secondary | ICD-10-CM

## 2014-10-08 DIAGNOSIS — F988 Other specified behavioral and emotional disorders with onset usually occurring in childhood and adolescence: Secondary | ICD-10-CM

## 2014-10-08 DIAGNOSIS — R413 Other amnesia: Secondary | ICD-10-CM

## 2014-10-08 DIAGNOSIS — F909 Attention-deficit hyperactivity disorder, unspecified type: Secondary | ICD-10-CM | POA: Diagnosis not present

## 2014-10-08 DIAGNOSIS — F32A Depression, unspecified: Secondary | ICD-10-CM

## 2014-10-08 MED ORDER — QUETIAPINE FUMARATE 100 MG PO TABS
100.0000 mg | ORAL_TABLET | Freq: Every day | ORAL | Status: DC
Start: 2014-10-08 — End: 2014-11-05

## 2014-10-08 MED ORDER — SERTRALINE HCL 100 MG PO TABS: 100.0000 mg | ORAL_TABLET | Freq: Every day | ORAL | Status: DC

## 2014-10-08 MED ORDER — LISDEXAMFETAMINE DIMESYLATE 30 MG PO CAPS: 30.0000 mg | ORAL_CAPSULE | ORAL | Status: DC

## 2014-10-08 MED ORDER — QUETIAPINE FUMARATE 50 MG PO TABS: 50.0000 mg | ORAL_TABLET | Freq: Every day | ORAL | Status: DC

## 2014-10-08 NOTE — Progress Notes (Signed)
[facility] Progress Note  Brittany Chung [id number] 73 y.o.  10/08/2014 11:12 AM  Chief Complaint:  I'm sleeping better.          History of Present Illness:  Brittany Chung came for her followup appointment with her daughter.  She is taking Seroquel 50 mg at bedtime.  Her daughter endorsed improvement in her sleep but patient remains very inattentive , manic and impulsive.  She continues to do poor judgment in her daily life.  She admitted recently her memory has been declining .  She is forgetful and some time lost directions while driving.  Her daughter also endorsed improvement in her agitation and anger but patient remains easily irritable and frustrated.  Now she is accepting that she has bipolar disorder and she had done some research .  She is taking Seroquel 50 mg and in that initially she has some sedation and fatigue but now things are getting better.  She has no tremors or shakes.  Patient admitted impulsive buying and shopping and having financial strains.  I have noticed gradually decline in her memory but she does not believe in it.  She recently seen her primary care physician and she had blood work.  Her hemoglobin A1c is 7.1.  Patient denies any self abusive behavior, suicidal thoughts or homicidal thought.  Her attention and concentration is poor.  She is very labile.  Daughter is wondering if Seroquel dose further increase.  Her appetite is okay.  Her vitals are stable. Patient denies drinking or using any illegal substances.  She lives with her daughter and 5 grandkids.  Suicidal Ideation: No Plan Formed: No Patient has means to carry out plan: No  Homicidal Ideation: No Plan Formed: No Patient has means to carry out plan: No  Medical History; Patient has history of irregular heartbeat, stroke, cardiac stenosis, hypertension and diabetes mellitus .  She goes to  [facility].      Past Psychiatric History/Hospitalization(s) Patient denies any  history of suicidal attempt, inpatient psychiatric treatment of any psychosis.  As per daughter she has history of bipolar disorder but she feels embarrassed to accept it.  She was given Seroquel by her primary care physician but she stopped taking it because of sedation.   Anxiety: Yes Bipolar Disorder: Yes Depression: Yes Mania: No Psychosis: No Schizophrenia: No Personality Disorder: No Hospitalization for psychiatric illness: No History of Electroconvulsive Shock Therapy: No Prior Suicide Attempts: No   Review of Systems  Constitutional: Positive for malaise/fatigue.  Eyes: Negative for blurred vision.  Cardiovascular: Negative for palpitations.  Skin: Negative for itching and rash.  Neurological: Negative for dizziness and tremors.  Psychiatric/Behavioral:       Poor attention and poor concentration Memory impairment    Psychiatric: Agitation: Irritability Hallucination: No Depressed Mood: No Insomnia: Yes Hypersomnia: No Altered Concentration: No Feels Worthless: No Grandiose Ideas: No Belief In Special Powers: No New/Increased Substance Abuse: No Compulsions: No  Neurologic: Headache: No Seizure: No Paresthesias: No    Outpatient Encounter Prescriptions as of 10/08/2014  Medication Sig  . aspirin 81 MG chewable tablet Chew 81 mg by mouth every morning.   Marland Kitchen b complex vitamins tablet Take 1 tablet by mouth daily.  . bimatoprost (LUMIGAN) 0.03 % ophthalmic drops Place 1 drop into both eyes at bedtime.    . Coenzyme Q10 (CO Q-10) 100 MG CAPS Take by mouth.  . enalapril (VASOTEC) 2.5 MG tablet Take 1 tablet (2.5 mg total) by mouth daily.  Marland Kitchen  Glucos-MSM-C-Mn-Ginger-Willow (MSM GLUCOSAMINE COMPLEX PO) Take by mouth.  . Incontinence Supplies MISC Use as directed  . Incontinence Supply Disposable (REALITY INCONTINENT BRIEFS SM) MISC Use as needed.  Marland Kitchen JANUVIA 100 MG tablet TAKE 1 TABLET BY MOUTH EVERY DAY  . lisdexamfetamine (VYVANSE) 30 MG capsule Take 1 capsule (30  mg total) by mouth every morning.  . metFORMIN (GLUCOPHAGE) 850 MG tablet TAKE 1 TABLET (850 MG TOTAL) BY MOUTH 2 (TWO) TIMES DAILY WITH A MEAL.  . Multiple Vitamins-Minerals (MULTIVITAMIN WITH MINERALS) tablet Take 1 tablet by mouth every evening.   . Omega-3 Fatty Acids (FISH OIL) 1000 MG CAPS Take 1 capsule by mouth daily.  . QUEtiapine (SEROQUEL) 50 MG tablet Take 1 tablet (50 mg total) by mouth at bedtime.  Marland Kitchen rOPINIRole (REQUIP) 1 MG tablet Take 3 tablets (3 mg total) by mouth at bedtime.  . sertraline (ZOLOFT) 100 MG tablet Take 1 tablet (100 mg total) by mouth daily.  . simvastatin (ZOCOR) 40 MG tablet Take 1 tablet (40 mg total) by mouth at bedtime.  . [DISCONTINUED] lisdexamfetamine (VYVANSE) 40 MG capsule Take 1 capsule (40 mg total) by mouth every morning.  . [DISCONTINUED] QUEtiapine (SEROQUEL) 50 MG tablet Take 1 tablet (50 mg total) by mouth at bedtime.  . [DISCONTINUED] sertraline (ZOLOFT) 100 MG tablet Take 1 tablet (100 mg total) by mouth daily.   No facility-administered encounter medications on file as of 10/08/2014.    Recent Results (from the past 2160 hour(s))  POCT HgB A1C     Status: None   Collection Time: 09/28/14  4:35 PM  Result Value Ref Range   Hemoglobin A1C 7.1   CBC     Status: Abnormal   Collection Time: 09/28/14  5:02 PM  Result Value Ref Range   WBC 10.3 4.0 - 10.5 K/uL   RBC 3.96 3.87 - 5.11 MIL/uL   Hemoglobin 11.5 (L) 12.0 - 15.0 g/dL   HCT 34.3 (L) 36.0 - 46.0 %   MCV 86.6 78.0 - 100.0 fL   MCH 29.0 26.0 - 34.0 pg   MCHC 33.5 30.0 - 36.0 g/dL   RDW 14.0 11.5 - 15.5 %   Platelets 250 150 - 400 K/uL   MPV 10.7 8.6 - 12.4 fL  Basic metabolic panel     Status: Abnormal   Collection Time: 09/28/14  5:02 PM  Result Value Ref Range   Sodium 141 135 - 145 mEq/L   Potassium 5.4 (H) 3.5 - 5.3 mEq/L    Comment: No visible hemolysis.   Chloride 104 96 - 112 mEq/L   CO2 24 19 - 32 mEq/L   Glucose, Bld 182 (H) 70 - 99 mg/dL   BUN 22 6 - 23 mg/dL    Creat 1.14 (H) 0.50 - 1.10 mg/dL   Calcium 9.6 8.4 - 10.5 mg/dL  Lipid Panel     Status: None   Collection Time: 09/28/14  5:02 PM  Result Value Ref Range   Cholesterol 180 0 - 200 mg/dL    Comment: ATP III Classification:       < 200        mg/dL        Desirable      200 - 239     mg/dL        Borderline High      >= 240        mg/dL        High      Triglycerides 140 <150  mg/dL   HDL 67 >=46 mg/dL    Comment: ** Please note change in reference range(s). **   Total CHOL/HDL Ratio 2.7 Ratio   VLDL 28 0 - 40 mg/dL   LDL Cholesterol 85 0 - 99 mg/dL    Comment:   Total Cholesterol/HDL Ratio:CHD Risk                        Coronary Heart Disease Risk Table                                        Men       Women          1/2 Average Risk              3.4        3.3              Average Risk              5.0        4.4           2X Average Risk              9.6        7.1           3X Average Risk             23.4       11.0 Use the calculated Patient Ratio above and the CHD Risk table  to determine the patient's CHD Risk. ATP III Classification (LDL):       < 100        mg/dL         Optimal      100 - 129     mg/dL         Near or Above Optimal      130 - 159     mg/dL         Borderline High      160 - 189     mg/dL         High       > 190        mg/dL         Very High         Physical Exam: Constitutional:  BP 113/65 mmHg  Pulse 82  Ht 5' 3.5" (1.613 m)  Wt 151 lb 6.4 oz (68.675 kg)  BMI 26.40 kg/m2  Musculoskeletal: Strength & Muscle Tone: within normal limits Gait & Station: normal Patient leans: Patient has a normal posture  Mental Status Examination;  Patient is groomed and well dressed female who appears to be her stated age.  Her speech is fast but coherent.  She described her mood  happy and her affect is labile.  Her attention concentration is distracted.  She denies any auditory or visual hallucination.  She denies any active or passive suicidal thoughts  or homicidal thoughts.  There were no paranoia or any delusions present at this time.  Her fund of knowledge is average.  Her thought process is  circumstantial and she has flight if ideas and loose association.  Her psychomotor activity is increased. She is alert and oriented x3 however difficulty remembering things..  Her insight judgment and impulse control is okay.   Established Problem, Stable/Improving (1), New problem, with  additional work up planned, Review of Psycho-Social Stressors (1), Review or order clinical lab tests (1), New Problem, with no additional work-up planned (3), Review of Last Therapy Session (1), Review of Medication Regimen & Side Effects (2) and Review of New Medication or Change in Dosage (2)  Assessment: Axis I: ADD inattentive type, bipolar disorder type I, major depressive disorder recurrent mild, cognitive disorder NOS  Axis II: Deferred  Axis III:  Past Medical History  Diagnosis Date  . Irregular heart beat   . Depression   . Stroke 2006  . Cancer     skin  . Carotid stenosis     s/p R CEA 2006  . Cataract     s/p surgery 2007   Plan:   patient shown some improvement since we started the Seroquel.  She still have a lot of mood lability and manic symptoms.  I would increase Seroquel 100 mg at bedtime.  I also reviewed records from her primary care physician including her recent blood work.  We talk about further reducing the stimulant dose to help her mood lability and manic symptoms.  Patient agreed to cut down Vyvanse 30 mg.  Continue Zoloft 100 mg daily.  I do believe patient should see a neurologist to rule out dementia.  Her memory has been gradually declining.  We will get a referral to see neurologist.  Discussed medication side effects and benefits.  At this time she does not have any side effects.  I will see her again in 4 weeks.  Plan discussed with the patient and her daughter who is very involved in the treatment plan.  Time spent more than 20  minutes and most of the time spent in psychoeducation, counseling, coordination of care, discussing new issues and prognosis.  Recommended to call us back if she has any question or any concern.   ARFEEN,SYED T., MD 10/08/2014

## 2014-10-08 NOTE — Telephone Encounter (Signed)
Telephone message received from patient's daughter questioning the dosage of Seroquel sent into their [facility] on [address] as patient thought this would be increased to Seroquel 100mg  1 qhs.  Verified through Dr. [name] note and with Dr. Adele Schilder he did intend to increase patient's Seroquel to 100mg  one at bedtime and gave this nurse a verbal order to correct.  Called Bill at [facility] on [address] to discontinue e-scribed 50mg  order that was sent today and gave new order for 100mg  Seroquel, one at bedtime, #30 with no refills.  Verified patient had not picked up the 50mg  yet and patient will receive the 100mg  dosage as Dr. Adele Schilder wants her to increase.  Attempted to leave patient and her daughter a message on their mobile phone provided, [phone] but their voicemail was full and would not take any more calls so left a message on patient's home phone, [phone] that patient's order for Seroquel 100mg  was now at her pharmacy, corrected and ready for pick up.

## 2014-10-09 ENCOUNTER — Other Ambulatory Visit (HOSPITAL_COMMUNITY): Payer: Self-pay | Admitting: Psychiatry

## 2014-10-11 NOTE — Telephone Encounter (Signed)
Remeron refill request declined as medication was discontinued 09/16/14.

## 2014-10-13 ENCOUNTER — Encounter: Payer: Self-pay | Admitting: Neurology

## 2014-10-13 ENCOUNTER — Ambulatory Visit (INDEPENDENT_AMBULATORY_CARE_PROVIDER_SITE_OTHER): Payer: Commercial Managed Care - HMO | Admitting: Neurology

## 2014-10-13 VITALS — BP 108/54 | HR 68 | Resp 16 | Ht 63.5 in | Wt 150.7 lb

## 2014-10-13 DIAGNOSIS — G3184 Mild cognitive impairment, so stated: Secondary | ICD-10-CM | POA: Diagnosis not present

## 2014-10-13 DIAGNOSIS — I679 Cerebrovascular disease, unspecified: Secondary | ICD-10-CM

## 2014-10-13 MED ORDER — DONEPEZIL HCL 5 MG PO TABS: 5.0000 mg | ORAL_TABLET | Freq: Every day | ORAL | Status: DC

## 2014-10-13 NOTE — Progress Notes (Signed)
NEUROLOGY CONSULTATION NOTE  MARLETTA BOUSQUET MRN: [id number] DOB: 1940-09-25  Referring provider: Dr. Adele Schilder Primary care provider: Dr. Jerline Pain  Reason for consult:  Memory loss  HISTORY OF PRESENT ILLNESS: Brittany Chung is a 74 year old right-handed woman with depression, Bipolar disorder, type 2 diabetes, hyperlipidemia and history of TIAs and carotid artery disease who presents for memory problems.  Records, labs, CT of head from 2014 and MRI of brain from 2011 reviewed.  She is accompanied by her daughter who provides some history.  She began having memory problems about 3 years ago.  It has gradually gotten worse over time.  She reports word-finding difficulties.  She misplaces objects such as her keys or dentures.  In the past, she used to become disoriented while driving on familiar routes, but not so much anymore.  She frequently would forget to pay her bills on time.  Now, her family has to do it for her.  She denies problems recognizing people.  She notes some difficulty recalling names of past friends whom she hasn't seen in years.  She is able to perform all of her ADLs.  She lives with her daughter and her family.  She denies hallucinations or delusions.  There is no known family history of dementia, however her parents passed away at a young age.  She had a right CEA about 9 years ago.  She had a CT of the head performed on 07/08/12 which revealed diffuse atrophy and small vessel disease.  A prior MRI of the brain from 12/27/09 showed chronic small vessel disease, progressed compared to an earlier scan in 2005.  She takes ASA 81mg  daily.  She takes Zocor 40mg .  Her LDL from 09/28/14 was 85.  Hgb A1c was 7.1  She has her GED.  She took four years of college but did not graduate.  PAST MEDICAL HISTORY: Past Medical History  Diagnosis Date  . Irregular heart beat   . Depression   . Stroke 2006  . Cancer     skin  . Carotid stenosis     s/p R CEA 2006  . Cataract     s/p  surgery 2007  . Memory loss     PAST SURGICAL HISTORY: Past Surgical History  Procedure Laterality Date  . Coronary angioplasty    . Basal cell carcinoma excision  02/18/2003  . Carotid endarterectomy  05/04/2005    R stenosis >80% on 03/16/2005  . Glaucoma surgery  06/25/2005  . Cataract extraction      MEDICATIONS: Current Outpatient Prescriptions on File Prior to Visit  Medication Sig Dispense Refill  . aspirin 81 MG chewable tablet Chew 81 mg by mouth every morning.     Marland Kitchen b complex vitamins tablet Take 1 tablet by mouth daily.    . bimatoprost (LUMIGAN) 0.03 % ophthalmic drops Place 1 drop into both eyes at bedtime.      . Coenzyme Q10 (CO Q-10) 100 MG CAPS Take by mouth.    . enalapril (VASOTEC) 2.5 MG tablet Take 1 tablet (2.5 mg total) by mouth daily. 90 tablet 3  . Glucos-MSM-C-Mn-Ginger-Willow (MSM GLUCOSAMINE COMPLEX PO) Take by mouth.    . Incontinence Supplies MISC Use as directed 1 each prn  . Incontinence Supply Disposable (REALITY INCONTINENT BRIEFS SM) MISC Use as needed. 1 each prn  . JANUVIA 100 MG tablet TAKE 1 TABLET BY MOUTH EVERY DAY 30 tablet 2  . lisdexamfetamine (VYVANSE) 30 MG capsule Take 1 capsule (30 mg total)  by mouth every morning. 30 capsule 0  . metFORMIN (GLUCOPHAGE) 850 MG tablet TAKE 1 TABLET (850 MG TOTAL) BY MOUTH 2 (TWO) TIMES DAILY WITH A MEAL. 60 tablet 5  . Multiple Vitamins-Minerals (MULTIVITAMIN WITH MINERALS) tablet Take 1 tablet by mouth every evening.     . Omega-3 Fatty Acids (FISH OIL) 1000 MG CAPS Take 1 capsule by mouth daily.    . QUEtiapine (SEROQUEL) 100 MG tablet Take 1 tablet (100 mg total) by mouth at bedtime. 30 tablet 0  . rOPINIRole (REQUIP) 1 MG tablet Take 3 tablets (3 mg total) by mouth at bedtime. 180 tablet 3  . sertraline (ZOLOFT) 100 MG tablet Take 1 tablet (100 mg total) by mouth daily. 30 tablet 0  . simvastatin (ZOCOR) 40 MG tablet Take 1 tablet (40 mg total) by mouth at bedtime. 90 tablet 3  . [DISCONTINUED]  rOPINIRole (REQUIP) 1 MG tablet Take 1 tablet (1 mg total) by mouth at bedtime. 30 tablet 3   No current facility-administered medications on file prior to visit.    ALLERGIES: Allergies  Allergen Reactions  . Codeine     REACTION: nausea/vomiting  . Neosporin [Neomycin-Bacitracin Zn-Polymyx]   . Petroleum Jelly [Petrolatum]   . Caine-1 [Lidocaine Hcl] Rash  . Clindamycin/Lincomycin Swelling  . Isothiazolinone Chloride Rash  . Neomycin Sulfate Rash  . Phenylene Rash    Phenylenediamine  . Quaternium-15 Rash    FAMILY HISTORY: Family History  Problem Relation Age of Onset  . Cancer Mother     Breast  . Aneurysm Father   . Cancer Sister     lung cancer  . Cancer Daughter     colon cancer  . Diabetes Maternal Grandmother   . Cancer Maternal Grandfather     lung  . Cancer Paternal Grandmother     mouth     SOCIAL HISTORY: History   Social History  . Marital Status: Married    Spouse Name: N/A  . Number of Children: N/A  . Years of Education: N/A   Occupational History  . Not on file.   Social History Main Topics  . Smoking status: Never Smoker   . Smokeless tobacco: Never Used  . Alcohol Use: No  . Drug Use: No  . Sexual Activity: Not Currently   Other Topics Concern  . Not on file   Social History Narrative   Lives with: daughter and  5 grandchildren   Hobbies: ballroom dance   Religion: Mormon             REVIEW OF SYSTEMS: Constitutional: No fevers, chills, or sweats, no generalized fatigue, change in appetite Eyes: No visual changes, double vision, eye pain Ear, nose and throat: No hearing loss, ear pain, nasal congestion, sore throat Cardiovascular: No chest pain, palpitations Respiratory:  No shortness of breath at rest or with exertion, wheezes GastrointestinaI: No nausea, vomiting, diarrhea, abdominal pain, fecal incontinence Genitourinary:  No dysuria, urinary retention or frequency Musculoskeletal:  No neck pain, back  pain Integumentary: No rash, pruritus, skin lesions Neurological: as above Psychiatric: No depression, insomnia, anxiety Endocrine: No palpitations, fatigue, diaphoresis, mood swings, change in appetite, change in weight, increased thirst Hematologic/Lymphatic:  No anemia, purpura, petechiae. Allergic/Immunologic: no itchy/runny eyes, nasal congestion, recent allergic reactions, rashes  PHYSICAL EXAM: Filed Vitals:   10/13/14 1319  BP: 108/54  Pulse: 68  Resp: 16   General: No acute distress Head:  Normocephalic/atraumatic Eyes:  fundi unremarkable, without vessel changes, exudates, hemorrhages or papilledema. Neck: supple,  no paraspinal tenderness, full range of motion Back: No paraspinal tenderness Heart: regular rate and rhythm Lungs: Clear to auscultation bilaterally. Vascular: No carotid bruits. Neurological Exam: Mental status: alert and oriented to person, place, and time, recent and remote memory intact, fund of knowledge intact, attention and concentration intact, speech fluent and not dysarthric, language intact. Montreal Cognitive Assessment  10/13/2014 02/23/2014  Visuospatial/ Executive (0/5) 4 3  Naming (0/3) 2 3  Attention: Read list of digits (0/2) 2 2  Attention: Read list of letters (0/1) 1 1  Attention: Serial 7 subtraction starting at 100 (0/3) 2 1  Language: Repeat phrase (0/2) 2 1  Language : Fluency (0/1) 1 1  Abstraction (0/2) 2 2  Delayed Recall (0/5) 3 4  Orientation (0/6) 6 6  Total 25 24  Adjusted Score (based on education) 25 24    Cranial nerves: CN I: not tested CN II: pupils equal, round and reactive to light, visual fields intact, fundi unremarkable, without vessel changes, exudates, hemorrhages or papilledema. CN III, IV, VI:  full range of motion, no nystagmus, no ptosis CN V: facial sensation intact CN VII: upper and lower face symmetric CN VIII: hearing intact CN IX, X: gag intact, uvula midline CN XI: sternocleidomastoid and  trapezius muscles intact CN XII: tongue midline Bulk & Tone: normal, no fasciculations. Motor:  5/5 throughout Sensation:  Temperature and vibration intact Deep Tendon Reflexes:  2+ throughout, toes downgoing Finger to nose testing:  No dysmetria Heel to shin:  No dysmetria Gait:  Normal station and stride.  Able to turn and walk in tandem. Romberg negative.  IMPRESSION: Mild cognitive impairment, primarily of the amnestic type.  Cerebrovascular disease  PLAN: 1.  Will get another MRI of brain to assess for any significant progression of small vessel disease 2.  Will start Aricept 5mg  daily for 4 weeks.  She should call with update and will increase to 10mg  at bedtime if tolerating. 3.  Continue ASA 81mg  daily for secondary stroke prevention 4.  Discuss with Dr. Jerline Pain about adjusting statin therapy.  LDL goal should be less than 70.   Thank you for allowing me to take part in the care of this patient.  Metta Clines, DO  CC:  Dimas Chyle, MD  Berniece Andreas, MD

## 2014-10-13 NOTE — Patient Instructions (Addendum)
1.  We will start donepezil (Aricept) 5mg  daily for four weeks.  If you are tolerating the medication, then after four weeks, we will increase the dose to 10mg  daily.  Side effects include nausea, vomiting, diarrhea, vivid dreams, and muscle cramps.  Please call the clinic if you experience any of these symptoms. 2.  Will get MRI of brain without contrast [facility]; 10/26/14 9:45am 3.  Continue aspirin 81mg  daily 4.  Discuss with change to Zocor with Dr. Jerline Pain.  Your LDL (bad cholesterol) is 85.  Ideally, it should be less than 70. 5.  Will consider neuropsychological testing. 6.  Follow up in 6 months.

## 2014-10-22 ENCOUNTER — Ambulatory Visit (HOSPITAL_COMMUNITY): Payer: Commercial Managed Care - HMO | Attending: Family Medicine

## 2014-10-26 ENCOUNTER — Ambulatory Visit (HOSPITAL_COMMUNITY)
Admission: RE | Admit: 2014-10-26 | Discharge: 2014-10-26 | Disposition: A | Discharge status: Home / Self Care | Payer: Commercial Managed Care - HMO | Source: Ambulatory Visit | Attending: Neurology | Admitting: Neurology

## 2014-10-26 ENCOUNTER — Telehealth: Payer: Self-pay | Admitting: *Deleted

## 2014-10-26 DIAGNOSIS — G3184 Mild cognitive impairment, so stated: Secondary | ICD-10-CM

## 2014-10-26 DIAGNOSIS — R413 Other amnesia: Secondary | ICD-10-CM | POA: Diagnosis present

## 2014-10-26 DIAGNOSIS — I679 Cerebrovascular disease, unspecified: Secondary | ICD-10-CM

## 2014-10-26 NOTE — Telephone Encounter (Signed)
MRI of brain shows slight progression of small vessel disease when compared to scan from 2011. As discussed at her visit, try to get the cholesterol and blood sugars under better control Patient and daughter Cordie Grice are aware

## 2014-10-26 NOTE — Telephone Encounter (Signed)
-----   Message from Pieter Partridge, DO sent at 10/26/2014  1:41 PM EDT ----- MRI of brain shows slight progression of small vessel disease when compared to scan from 2011.  As discussed at her visit, try to get the cholesterol and blood sugars under better control

## 2014-11-02 ENCOUNTER — Other Ambulatory Visit (HOSPITAL_COMMUNITY): Payer: Self-pay | Admitting: Psychiatry

## 2014-11-02 ENCOUNTER — Other Ambulatory Visit: Payer: Self-pay | Admitting: Neurology

## 2014-11-02 ENCOUNTER — Other Ambulatory Visit: Payer: Self-pay | Admitting: Family Medicine

## 2014-11-02 DIAGNOSIS — F310 Bipolar disorder, current episode hypomanic: Secondary | ICD-10-CM

## 2014-11-03 NOTE — Telephone Encounter (Signed)
Rx filled.  Algis Greenhouse. Jerline Pain, [facility] Resident PGY-1 11/03/2014 4:56 PM

## 2014-11-04 ENCOUNTER — Other Ambulatory Visit (HOSPITAL_COMMUNITY): Payer: Self-pay | Admitting: Psychiatry

## 2014-11-04 NOTE — Telephone Encounter (Signed)
Pt due for follow up on 11-11-14. Refill due for 11-08-14.

## 2014-11-05 MED ORDER — QUETIAPINE FUMARATE 100 MG PO TABS: 100.0000 mg | ORAL_TABLET | Freq: Every day | ORAL | Status: DC

## 2014-11-05 NOTE — Telephone Encounter (Signed)
Telephone call from [place] at [facility] on [facility] requesting a one time refill of patient's Seroquel.  Patient returns to see Dr. Adele Schilder on 11/11/14, last order sent 10/08/14 with no refills, so one time order authorized per Dr. Adele Schilder.

## 2014-11-11 ENCOUNTER — Encounter (HOSPITAL_COMMUNITY): Payer: Self-pay | Admitting: Psychiatry

## 2014-11-11 ENCOUNTER — Ambulatory Visit (INDEPENDENT_AMBULATORY_CARE_PROVIDER_SITE_OTHER): Payer: Commercial Managed Care - HMO | Admitting: Psychiatry

## 2014-11-11 VITALS — BP 117/61 | HR 82 | Ht 63.5 in | Wt 146.2 lb

## 2014-11-11 DIAGNOSIS — G3184 Mild cognitive impairment, so stated: Secondary | ICD-10-CM | POA: Diagnosis not present

## 2014-11-11 DIAGNOSIS — F988 Other specified behavioral and emotional disorders with onset usually occurring in childhood and adolescence: Secondary | ICD-10-CM

## 2014-11-11 DIAGNOSIS — F909 Attention-deficit hyperactivity disorder, unspecified type: Secondary | ICD-10-CM

## 2014-11-11 DIAGNOSIS — F310 Bipolar disorder, current episode hypomanic: Secondary | ICD-10-CM

## 2014-11-11 MED ORDER — LISDEXAMFETAMINE DIMESYLATE 30 MG PO CAPS: 30.0000 mg | ORAL_CAPSULE | Freq: Every day | ORAL | Status: DC

## 2014-11-11 MED ORDER — DIVALPROEX SODIUM 125 MG PO DR TAB: DELAYED_RELEASE_TABLET | ORAL | Status: DC

## 2014-11-11 MED ORDER — SERTRALINE HCL 100 MG PO TABS: 100.0000 mg | ORAL_TABLET | Freq: Every day | ORAL | Status: DC

## 2014-11-11 NOTE — Progress Notes (Signed)
[facility] [phone] Progress Note  Brittany Chung [id number] 74 y.o.  11/11/2014 1:00 PM  Chief Complaint:  ' I'm not sleeping well.            History of Present Illness:  Brittany Chung came for her followup appointment with her daughter.   Her daughter is concerned about patient's behavior who is been more irritable, angry and frustrated for no reason.  She is sleeping only a few hours.  She is not sure if Seroquel helping her.  When I ask if she is taking the Seroquel her daughter not sure because patient gets irritable when she did remind her to take the medication.  Patient's memory has been deteriorated.  She was seen neurologist and Aricept as given.  Her CT scan and MRI is pending.  She was found to have a small vessel disease .  Patient is still have mania and irritability.  She continues to get impulsive and continues to buy unnecessary things.  She is no longer taking Remeron but is still taking Vyvanse and Zoloft.  She denies any paranoia or any hallucination but her attention and concentration is poor.  Patient denies any suicidal thoughts.  Her appetite is okay.  She lost some weight.  She denies any drinking or using any illegal substances.  Patient lives with her daughter and her 5 grandkids.  Suicidal Ideation: No Plan Formed: No Patient has means to carry out plan: No  Homicidal Ideation: No Plan Formed: No Patient has means to carry out plan: No  Medical History; Patient has history of irregular heartbeat, stroke, cardiac stenosis, hypertension and diabetes mellitus .  She goes to  [facility].      Past Psychiatric History/Hospitalization(s) Patient denies any history of suicidal attempt, inpatient psychiatric treatment of any psychosis.  As per daughter she has history of bipolar disorder but she feels embarrassed to accept it.  She was given Seroquel by her primary care physician but she stopped taking it because of sedation.   She was taking Remeron  which was stopped when Seroquel was started a few months ago. Anxiety: Yes Bipolar Disorder: Yes Depression: Yes Mania: No Psychosis: No Schizophrenia: No Personality Disorder: No Hospitalization for psychiatric illness: No History of Electroconvulsive Shock Therapy: No Prior Suicide Attempts: No   Review of Systems  Constitutional: Positive for malaise/fatigue.  Eyes: Negative for blurred vision.  Cardiovascular: Negative for palpitations.  Skin: Negative for itching and rash.  Neurological: Negative for dizziness and tremors.  Psychiatric/Behavioral:       Poor attention and poor concentration Memory impairment    Psychiatric: Agitation: Irritability Hallucination: No Depressed Mood: No Insomnia: Yes Hypersomnia: No Altered Concentration: No Feels Worthless: No Grandiose Ideas: No Belief In Special Powers: No New/Increased Substance Abuse: No Compulsions: No  Neurologic: Headache: No Seizure: No Paresthesias: No    Outpatient Encounter Prescriptions as of 11/11/2014  Medication Sig  . aspirin 81 MG chewable tablet Chew 81 mg by mouth every morning.   Marland Kitchen b complex vitamins tablet Take 1 tablet by mouth daily.  . bimatoprost (LUMIGAN) 0.03 % ophthalmic drops Place 1 drop into both eyes at bedtime.    . Coenzyme Q10 (CO Q-10) 100 MG CAPS Take by mouth.  . divalproex (DEPAKOTE) 125 MG DR tablet Take 1 tab at bed time for 1 week and than 2 tab at bed time  . donepezil (ARICEPT) 5 MG tablet TAKE 1 TABLET (5 MG TOTAL) BY MOUTH AT BEDTIME.  Marland Kitchen  enalapril (VASOTEC) 2.5 MG tablet Take 1 tablet (2.5 mg total) by mouth daily.  Donnie Aho (MSM GLUCOSAMINE COMPLEX PO) Take by mouth.  . Incontinence Supplies MISC Use as directed  . Incontinence Supply Disposable (REALITY INCONTINENT BRIEFS SM) MISC Use as needed.  Marland Kitchen JANUVIA 100 MG tablet TAKE 1 TABLET BY MOUTH EVERY DAY  . lisdexamfetamine (VYVANSE) 30 MG capsule Take 1 capsule (30 mg total) by mouth daily.   . metFORMIN (GLUCOPHAGE) 850 MG tablet TAKE 1 TABLET (850 MG TOTAL) BY MOUTH 2 (TWO) TIMES DAILY WITH A MEAL.  . Multiple Vitamins-Minerals (MULTIVITAMIN WITH MINERALS) tablet Take 1 tablet by mouth every evening.   . Omega-3 Fatty Acids (FISH OIL) 1000 MG CAPS Take 1 capsule by mouth daily.  Marland Kitchen rOPINIRole (REQUIP) 1 MG tablet Take 3 tablets (3 mg total) by mouth at bedtime.  . sertraline (ZOLOFT) 100 MG tablet Take 1 tablet (100 mg total) by mouth daily.  . simvastatin (ZOCOR) 40 MG tablet Take 1 tablet (40 mg total) by mouth at bedtime.  . [DISCONTINUED] JANUVIA 100 MG tablet TAKE 1 TABLET BY MOUTH EVERY DAY  . [DISCONTINUED] lisdexamfetamine (VYVANSE) 30 MG capsule Take 1 capsule (30 mg total) by mouth every morning.  . [DISCONTINUED] mirtazapine (REMERON) 15 MG tablet   . [DISCONTINUED] QUEtiapine (SEROQUEL) 100 MG tablet Take 1 tablet (100 mg total) by mouth at bedtime.  . [DISCONTINUED] sertraline (ZOLOFT) 100 MG tablet TAKE 1 TABLET BY MOUTH EVERY DAY  . [DISCONTINUED] VYVANSE 40 MG capsule   . [DISCONTINUED] VYVANSE 70 MG capsule Take 70 mg by mouth every morning.   No facility-administered encounter medications on file as of 11/11/2014.    Recent Results (from the past 2160 hour(s))  POCT HgB A1C     Status: None   Collection Time: 09/28/14  4:35 PM  Result Value Ref Range   Hemoglobin A1C 7.1   CBC     Status: Abnormal   Collection Time: 09/28/14  5:02 PM  Result Value Ref Range   WBC 10.3 4.0 - 10.5 K/uL   RBC 3.96 3.87 - 5.11 MIL/uL   Hemoglobin 11.5 (L) 12.0 - 15.0 g/dL   HCT 34.3 (L) 36.0 - 46.0 %   MCV 86.6 78.0 - 100.0 fL   MCH 29.0 26.0 - 34.0 pg   MCHC 33.5 30.0 - 36.0 g/dL   RDW 14.0 11.5 - 15.5 %   Platelets 250 150 - 400 K/uL   MPV 10.7 8.6 - 12.4 fL  Basic metabolic panel     Status: Abnormal   Collection Time: 09/28/14  5:02 PM  Result Value Ref Range   Sodium 141 135 - 145 mEq/L   Potassium 5.4 (H) 3.5 - 5.3 mEq/L    Comment: No visible hemolysis.    Chloride 104 96 - 112 mEq/L   CO2 24 19 - 32 mEq/L   Glucose, Bld 182 (H) 70 - 99 mg/dL   BUN 22 6 - 23 mg/dL   Creat 1.14 (H) 0.50 - 1.10 mg/dL   Calcium 9.6 8.4 - 10.5 mg/dL  Lipid Panel     Status: None   Collection Time: 09/28/14  5:02 PM  Result Value Ref Range   Cholesterol 180 0 - 200 mg/dL    Comment: ATP III Classification:       < 200        mg/dL        Desirable      200 - 239  mg/dL        Borderline High      >= 240        mg/dL        High      Triglycerides 140 <150 mg/dL   HDL 67 >=46 mg/dL    Comment: ** Please note change in reference range(s). **   Total CHOL/HDL Ratio 2.7 Ratio   VLDL 28 0 - 40 mg/dL   LDL Cholesterol 85 0 - 99 mg/dL    Comment:   Total Cholesterol/HDL Ratio:CHD Risk                        Coronary Heart Disease Risk Table                                        Men       Women          1/2 Average Risk              3.4        3.3              Average Risk              5.0        4.4           2X Average Risk              9.6        7.1           3X Average Risk             23.4       11.0 Use the calculated Patient Ratio above and the CHD Risk table  to determine the patient's CHD Risk. ATP III Classification (LDL):       < 100        mg/dL         Optimal      100 - 129     mg/dL         Near or Above Optimal      130 - 159     mg/dL         Borderline High      160 - 189     mg/dL         High       > 190        mg/dL         Very High         Physical Exam: Constitutional:  BP 117/61 mmHg  Pulse 82  Ht 5' 3.5" (1.613 m)  Wt 146 lb 3.2 oz (66.316 kg)  BMI 25.49 kg/m2  Musculoskeletal: Strength & Muscle Tone: within normal limits Gait & Station: normal Patient leans: Patient has a normal posture  Mental Status Examination;  Patient is groomed and well dressed female who appears to be her stated age.  Her speech is fast but coherent.  She described her mood  Tired and her affect is labile.  Her attention concentration  is distracted.  She denies any auditory or visual hallucination.  She denies any active or passive suicidal thoughts or homicidal thoughts.  There were no paranoia or any delusions present at this time.  Her fund of knowledge is average.  Her thought process is  circumstantial and she  has flight if ideas and loose association.  Her psychomotor activity is increased. She is alert and oriented x3 however difficulty remembering things..  Her insight judgment and impulse control is okay.   Established Problem, Stable/Improving (1), New problem, with additional work up planned, Review or order clinical lab tests (1), Review and summation of old records (2), Established Problem, Worsening (2), Review of Last Therapy Session (1), Review of Medication Regimen & Side Effects (2) and Review of New Medication or Change in Dosage (2)  Assessment: Axis I: ADD inattentive type, bipolar disorder type I, major depressive disorder recurrent mild, cognitive disorder NOS  Axis II: Deferred  Axis III:  Past Medical History  Diagnosis Date  . Irregular heart beat   . Depression   . Stroke 2006  . Cancer     skin  . Carotid stenosis     s/p R CEA 2006  . Cataract     s/p surgery 2007  . Memory loss    Plan:  It is unclear if patient is compliant with Seroquel at bedtime.  Her daughter endorsed that she has high cholesterol and patient also mentioned some time she feels very groggy with the Seroquel.  We will start Depakote 125 mg at bedtime and gradually increased to 50 mg at bedtime.  I will discontinue Seroquel.  Recommended to continue Vyvanse 30 mg and Zoloft 100 mg daily.  I reviewed the records from neurology and encouraged to contact neurology if Aricept is not helping her memory issues.  We will also follow-up on her CT scan and MRI results.  Discussed medication side effects especially Depakote can cause sedation, metabolic syndrome tremors and shakes.  Recommended to call us back if she has any question  or any concern.  Follow-up in 4 weeks. Plan discussed with the patient and her daughter who is very involved in the treatment plan.  Time spent more than 20 minutes and most of the time spent in psychoeducation, counseling, coordination of care, discussing new issues and prognosis.  Recommended to call us back if she has any question or any concern.   ARFEEN,SYED T., MD 11/11/2014

## 2014-11-22 ENCOUNTER — Ambulatory Visit (INDEPENDENT_AMBULATORY_CARE_PROVIDER_SITE_OTHER): Payer: Commercial Managed Care - HMO | Admitting: Family Medicine

## 2014-11-22 ENCOUNTER — Encounter: Payer: Self-pay | Admitting: Family Medicine

## 2014-11-22 VITALS — BP 116/53 | HR 84 | Temp 98.2°F | Wt 143.0 lb

## 2014-11-22 DIAGNOSIS — L03011 Cellulitis of right finger: Secondary | ICD-10-CM | POA: Diagnosis not present

## 2014-11-22 MED ORDER — DOXYCYCLINE HYCLATE 100 MG PO TABS: 100.0000 mg | ORAL_TABLET | Freq: Two times a day (BID) | ORAL | Status: DC

## 2014-11-22 NOTE — Progress Notes (Signed)
   Subjective:    Patient ID: Brittany Chung, female    DOB: Jan 29, 1941, 74 y.o.   MRN: [id number]  Seen for Same day visit for   CC: swollen finger  She reports swollen right pinky finger 2-3 days.  Pain is well and started initially after gardening.  Denies any cuts or wounds.  Pain and swelling have subsided in the last 24 hours, with application of ice packs.  Denies fevers or chills.   Review of Systems   See HPI for ROS. Objective:  BP 116/53 mmHg  Pulse 84  Temp(Src) 98.2 F (36.8 C) (Oral)  Wt 143 lb (64.864 kg)  General: NAD Right 5th finger: Swelling and erythema in between MCP and PIP without joint involvement. Mild warmth. Good cap refill. ROM limited by swelling    Assessment & Plan:  See Problem List Documentation  Cellulitis of right pinky finger - Doxy BID x 5 days - return precautions given

## 2014-11-22 NOTE — Patient Instructions (Signed)
It was great seeing you today.   1. Take doxycline 100mg  (1pill) twice a day for 3-5 days.  2. This medication can cause sun sensitivity so be sure to cover your skin while outside.  3. Call if you develop worsening swelling, pain or fevers.    If you have any questions or concerns before then, please call the clinic at [phone].  Take Care,   Dr Phill Myron

## 2014-11-30 ENCOUNTER — Ambulatory Visit (INDEPENDENT_AMBULATORY_CARE_PROVIDER_SITE_OTHER): Payer: Commercial Managed Care - HMO | Admitting: Family Medicine

## 2014-11-30 ENCOUNTER — Ambulatory Visit: Payer: Self-pay | Admitting: Family Medicine

## 2014-11-30 ENCOUNTER — Encounter: Payer: Self-pay | Admitting: Family Medicine

## 2014-11-30 VITALS — BP 114/48 | HR 76 | Wt 145.0 lb

## 2014-11-30 DIAGNOSIS — M25511 Pain in right shoulder: Secondary | ICD-10-CM | POA: Insufficient documentation

## 2014-11-30 NOTE — Progress Notes (Signed)
Brittany Chung is a 74 y.o. female who presents to the [facility] today with a chief complaint of right shoulder pain after a fall.   HPI:  Right Shoulder Pain Patient fell about a month ago. States that she was outside in her yard, moving potted plants when she stepped in a hole and landed directly on her right shoulder. States that she felt  "crunch" when she fell. 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 Patient reports trying some OTC patches and tylenol which have helped some with the pain. Pain is worse with movement and reaching up, though patient and her niece state that the pain has "not slowed her down." Has not noticed anything that makes the pain better.    ROS: As per HPI, otherwise all systems reviewed and are negative.  Past Medical History - Reviewed and updated Patient Active Problem List   Diagnosis Date Noted  . Right shoulder pain 11/30/2014  . Healthcare maintenance 09/28/2014  . Incontinence in female 03/26/2014  . Hearing loss 03/26/2014  . Mild cognitive impairment 02/23/2014  . Driving safety issue 02/23/2014  . Legionella pneumonia 02/09/2014  . Neck pain 01/05/2014  . Stuttering 10/21/2013  . S/P carotid endarterectomy 04/01/2013  . Restless leg syndrome 01/18/2012  . Vitamin D deficiency 01/18/2012  . Chronic narrow angle glaucoma 10/12/2011  . Right knee pain 09/20/2011  . Counseling regarding advanced directives 08/18/2010  . ADHD 03/16/2010  . BIPOLAR DISORDER UNSPECIFIED 06/22/2009  . HERPES, GENITAL NOS 02/24/2007  . Diabetes mellitus, type II 10/01/2006  . HYPERLIPIDEMIA 10/01/2006  . HYPERTENSION, BENIGN ESSENTIAL 10/01/2006  . Irritable bowel syndrome 09/19/2006    Medications- reviewed and updated Current Outpatient Prescriptions  Medication Sig Dispense Refill  . aspirin 81 MG chewable tablet Chew 81 mg by  mouth every morning.     Marland Kitchen b complex vitamins tablet Take 1 tablet by mouth daily.    . bimatoprost (LUMIGAN) 0.03 % ophthalmic drops Place 1 drop into both eyes at bedtime.      . Coenzyme Q10 (CO Q-10) 100 MG CAPS Take by mouth.    . divalproex (DEPAKOTE) 125 MG DR tablet Take 1 tab at bed time for 1 week and than 2 tab at bed time 60 tablet 0  . donepezil (ARICEPT) 5 MG tablet TAKE 1 TABLET (5 MG TOTAL) BY MOUTH AT BEDTIME. 30 tablet 0  . doxycycline (VIBRA-TABS) 100 MG tablet Take 1 tablet (100 mg total) by mouth 2 (two) times daily. 10 tablet 0  . enalapril (VASOTEC) 2.5 MG tablet Take 1 tablet (2.5 mg total) by mouth daily. 90 tablet 3  . Glucos-MSM-C-Mn-Ginger-Willow (MSM GLUCOSAMINE COMPLEX PO) Take by mouth.    . Incontinence Supplies MISC Use as directed 1 each prn  . Incontinence Supply Disposable (REALITY INCONTINENT BRIEFS SM) MISC Use as needed. 1 each prn  . JANUVIA 100 MG tablet TAKE 1 TABLET BY MOUTH EVERY DAY 30 tablet 2  . lisdexamfetamine (VYVANSE) 30 MG capsule Take 1 capsule (30 mg total) by mouth daily. 30 capsule 0  . metFORMIN (GLUCOPHAGE) 850 MG tablet TAKE 1 TABLET (850 MG TOTAL) BY MOUTH 2 (TWO) TIMES DAILY WITH A MEAL. 60 tablet 5  . Multiple Vitamins-Minerals (MULTIVITAMIN WITH MINERALS) tablet Take 1 tablet by mouth every evening.     . Omega-3 Fatty Acids (FISH OIL) 1000 MG CAPS  Take 1 capsule by mouth daily.    Marland Kitchen rOPINIRole (REQUIP) 1 MG tablet Take 3 tablets (3 mg total) by mouth at bedtime. 180 tablet 3  . sertraline (ZOLOFT) 100 MG tablet Take 1 tablet (100 mg total) by mouth daily. 30 tablet 1  . simvastatin (ZOCOR) 40 MG tablet Take 1 tablet (40 mg total) by mouth at bedtime. 90 tablet 3  . [DISCONTINUED] rOPINIRole (REQUIP) 1 MG tablet Take 1 tablet (1 mg total) by mouth at bedtime. 30 tablet 3   No current facility-administered medications for this visit.    Objective: Physical Exam: BP 114/48 mmHg  Pulse 76  Wt 145 lb (65.772 kg)  Gen: NAD,  resting comfortably CV: RRR with no murmurs appreciated Lungs: NWOB, CTAB with no crackles, wheezes, or rhonchi MSK: - Back: C-spine without step offs or deformities. Nontender. Mild tenderness along superior edge of right trapezius.  - Left shoulder, no deformities, full ROM, negative Neer's and Hawkin's, negative drop arm test - Right shoulder: No deformities. Tender to palpation along posterior aspect of shoulder along humeral head and scapula. AC joint nontender. FROM. Negative drop arm test. Negative Neer's. Positive Hawkins.  Skin: warm, dry Neuro: grossly normal, moves all extremities  No results found for this or any previous visit (from the past 72 hour(s)).  A/P: See problem list  Right shoulder pain S/p fall 1 month ago. Rotator cuff tear less likely given full ROM on exam. Additionally, patient is not likely a repair candidate and thus will not obtain MRI at this time. Fracture cannot be excluded. Will obtain plain films.   Will treat conservatively at this time with OTC tylenol, naproxen, and exercise. May consider referral to physical therapy if not improving.     Orders Placed This Encounter  Procedures  . DG Shoulder Right    Standing Status: Future     Number of Occurrences:      Standing Expiration Date: 01/30/2016    Order Specific Question:  Reason for Exam (SYMPTOM  OR DIAGNOSIS REQUIRED)    Answer:  right shoulder pain    Order Specific Question:  Preferred imaging location?    Answer:  [facility]. Jerline Pain, [place] Resident PGY-1 11/30/2014 4:19 PM

## 2014-11-30 NOTE — Assessment & Plan Note (Signed)
S/p fall 1 month ago. Rotator cuff tear less likely given full ROM on exam. Additionally, patient is not likely a repair candidate and thus will not obtain MRI at this time. Fracture cannot be excluded. Will obtain plain films.   Will treat conservatively at this time with OTC tylenol, naproxen, and exercise. May consider referral to physical therapy if not improving.

## 2014-11-30 NOTE — Patient Instructions (Signed)
Thank you for coming to the clinic today. It was nice seeing you.  For your shoulder pain, you probably just have some mild inflammation related to the fall. We will be getting an xray to make sure you do not have a fracture. You have good range of motion, so you probably do not have a rotator cuff tear.  You can continue to take the tylenol as needed. You can also take naproxen as needed.

## 2014-12-01 ENCOUNTER — Ambulatory Visit (HOSPITAL_COMMUNITY)
Admission: RE | Admit: 2014-12-01 | Discharge: 2014-12-01 | Disposition: A | Discharge status: Home / Self Care | Payer: Commercial Managed Care - HMO | Source: Ambulatory Visit | Attending: Family Medicine | Admitting: Family Medicine

## 2014-12-01 DIAGNOSIS — M25511 Pain in right shoulder: Secondary | ICD-10-CM | POA: Insufficient documentation

## 2014-12-02 ENCOUNTER — Encounter: Payer: Self-pay | Admitting: Family Medicine

## 2014-12-03 ENCOUNTER — Other Ambulatory Visit (HOSPITAL_COMMUNITY): Payer: Self-pay | Admitting: Psychiatry

## 2014-12-03 NOTE — Telephone Encounter (Signed)
Seroquel refill denied as it was discontinued on 11/11/14 by Dr. Adele Schilder.  Informed pharmacy patient needed to make an appointment through e-scription message.

## 2014-12-20 ENCOUNTER — Other Ambulatory Visit (HOSPITAL_COMMUNITY): Payer: Self-pay

## 2014-12-20 ENCOUNTER — Other Ambulatory Visit (HOSPITAL_COMMUNITY): Payer: Self-pay | Admitting: Psychiatry

## 2014-12-20 DIAGNOSIS — F988 Other specified behavioral and emotional disorders with onset usually occurring in childhood and adolescence: Secondary | ICD-10-CM

## 2014-12-20 MED ORDER — LISDEXAMFETAMINE DIMESYLATE 30 MG PO CAPS: 30.0000 mg | ORAL_CAPSULE | Freq: Every day | ORAL | Status: DC

## 2014-12-20 NOTE — Telephone Encounter (Signed)
Met with Dr. Adele Schilder who authorized a refill of patient's Vyvanse and requested it be printed out to sign.  Dr. Adele Schilder reviewed and signed order and called Ms. Ids back to inform the order was prepared for pick up.

## 2014-12-20 NOTE — Telephone Encounter (Signed)
Telephone all with Brittany Chung, patient's daughter reporting patient is in need of a new Vyvanse order which was last provided for one time order on 11/11/14 and patient does not return for next evaluation until 04/20/15.  Agreed to send request to Dr. Adele Schilder and to contact Ms. Ids once order prepared for pick up.

## 2014-12-21 ENCOUNTER — Telehealth (HOSPITAL_COMMUNITY): Payer: Self-pay

## 2014-12-21 NOTE — Telephone Encounter (Signed)
Remer Macho, daughter picked up prescription on 11/25/44  [place]  [phone]  dlo

## 2015-01-02 ENCOUNTER — Other Ambulatory Visit (HOSPITAL_COMMUNITY): Payer: Self-pay | Admitting: Psychiatry

## 2015-01-04 NOTE — Telephone Encounter (Signed)
Received fax from [facility] for a refill request for Depakote 125mg . Refill request is denied, pt will need to schedule appt with office.

## 2015-01-05 ENCOUNTER — Telehealth: Payer: Self-pay | Admitting: Family Medicine

## 2015-01-05 NOTE — Telephone Encounter (Signed)
[employer] completed.

## 2015-01-05 NOTE — Telephone Encounter (Signed)
Pt called and needs a referral to [facility] for today. jw

## 2015-01-10 ENCOUNTER — Other Ambulatory Visit: Payer: Self-pay | Admitting: Neurology

## 2015-01-10 ENCOUNTER — Telehealth (HOSPITAL_COMMUNITY): Payer: Self-pay

## 2015-01-10 NOTE — Telephone Encounter (Signed)
Telephone call with Ms. Ids two times this date to follow up on patient's requested refill of Vyvanse.  Ms. Jodene Nam agreed to bring patient in to see Dr. Adele Schilder on 01/11/15 as she did not reschedule a new visit after last evaluation and was to return in 4 weeks with Ms.Forand.  Ms. Jodene Nam agreed with appointment time and will see 01/11/15 at 3:30pm

## 2015-01-11 ENCOUNTER — Ambulatory Visit (INDEPENDENT_AMBULATORY_CARE_PROVIDER_SITE_OTHER): Payer: Medicare HMO | Admitting: Psychiatry

## 2015-01-11 ENCOUNTER — Encounter (HOSPITAL_COMMUNITY): Payer: Self-pay | Admitting: Psychiatry

## 2015-01-11 VITALS — BP 118/54 | HR 80 | Ht 63.5 in | Wt 144.4 lb

## 2015-01-11 DIAGNOSIS — F310 Bipolar disorder, current episode hypomanic: Secondary | ICD-10-CM

## 2015-01-11 DIAGNOSIS — F909 Attention-deficit hyperactivity disorder, unspecified type: Secondary | ICD-10-CM | POA: Diagnosis not present

## 2015-01-11 DIAGNOSIS — F319 Bipolar disorder, unspecified: Secondary | ICD-10-CM | POA: Diagnosis not present

## 2015-01-11 DIAGNOSIS — F988 Other specified behavioral and emotional disorders with onset usually occurring in childhood and adolescence: Secondary | ICD-10-CM

## 2015-01-11 MED ORDER — SERTRALINE HCL 100 MG PO TABS: 100.0000 mg | ORAL_TABLET | Freq: Every day | ORAL | Status: DC

## 2015-01-11 MED ORDER — LISDEXAMFETAMINE DIMESYLATE 30 MG PO CAPS: 30.0000 mg | ORAL_CAPSULE | Freq: Every day | ORAL | Status: DC

## 2015-01-11 NOTE — Progress Notes (Signed)
[place] Progress Note  REALITY DEJONGE [id number] 74 y.o.  01/11/2015 4:34 PM  Chief Complaint:  Medication management and follow-up.             History of Present Illness:  Dane came for her followup appointment with her daughter.  She is taking Vyvanse 30 mg and Zoloft 100 mg as prescribed.  Her daughter endorsed that she has unable to see the neurologist but promised that she will see her primary care physician to get referral to see neurologist as soon as possible.  Overall her attention and focus is good.  She never tried Depakote.  Her daughter endorsed that her irritability, anger is under control.  She sleeping good.  Her energy level is good.  Her daughter believe that she is not having any manic symptoms however she still have memory issues.  She sleeping 7-8 hours.  She denies any paranoia or any hallucination.  Her attention and concentration is improved from the past.  She has no tremors, shakes.  Patient denies any feeling of hopelessness or worthlessness.  She denies any crying spells.  Patient lives with her daughter and her 5 grandkids.  Suicidal Ideation: No Plan Formed: No Patient has means to carry out plan: No  Homicidal Ideation: No Plan Formed: No Patient has means to carry out plan: No  Medical History; Patient has history of irregular heartbeat, stroke, cardiac stenosis, hypertension and diabetes mellitus .  She goes to  [facility].      Past Psychiatric History/Hospitalization(s) Patient denies any history of suicidal attempt, inpatient psychiatric treatment of any psychosis.  As per daughter she has history of bipolar disorder but she feels embarrassed to accept it.  She was given Seroquel by her primary care physician but she stopped taking it because of sedation.   She was taking Remeron which was stopped when Seroquel was started a few months ago. Anxiety: Yes Bipolar Disorder: Yes Depression: Yes Mania:  No Psychosis: No Schizophrenia: No Personality Disorder: No Hospitalization for psychiatric illness: No History of Electroconvulsive Shock Therapy: No Prior Suicide Attempts: No   Review of Systems  Eyes: Negative for blurred vision.  Cardiovascular: Negative for palpitations.  Skin: Negative for itching and rash.  Neurological: Negative for dizziness and tremors.    Psychiatric: Agitation: No Hallucination: No Depressed Mood: No Insomnia: No Hypersomnia: No Altered Concentration: No Feels Worthless: No Grandiose Ideas: No Belief In Special Powers: No New/Increased Substance Abuse: No Compulsions: No  Neurologic: Headache: No Seizure: No Paresthesias: No    Outpatient Encounter Prescriptions as of 01/11/2015  Medication Sig  . aspirin 81 MG chewable tablet Chew 81 mg by mouth every morning.   Marland Kitchen b complex vitamins tablet Take 1 tablet by mouth daily.  . bimatoprost (LUMIGAN) 0.03 % ophthalmic drops Place 1 drop into both eyes at bedtime.    . Coenzyme Q10 (CO Q-10) 100 MG CAPS Take by mouth.  . donepezil (ARICEPT) 5 MG tablet TAKE 1 TABLET (5 MG TOTAL) BY MOUTH AT BEDTIME.  . enalapril (VASOTEC) 2.5 MG tablet Take 1 tablet (2.5 mg total) by mouth daily.  Donnie Aho (MSM GLUCOSAMINE COMPLEX PO) Take by mouth.  . Incontinence Supplies MISC Use as directed  . Incontinence Supply Disposable (REALITY INCONTINENT BRIEFS SM) MISC Use as needed.  Marland Kitchen JANUVIA 100 MG tablet TAKE 1 TABLET BY MOUTH EVERY DAY  . lisdexamfetamine (VYVANSE) 30 MG capsule Take 1 capsule (30 mg total) by mouth daily.  Marland Kitchen  metFORMIN (GLUCOPHAGE) 850 MG tablet TAKE 1 TABLET (850 MG TOTAL) BY MOUTH 2 (TWO) TIMES DAILY WITH A MEAL.  . Multiple Vitamins-Minerals (MULTIVITAMIN WITH MINERALS) tablet Take 1 tablet by mouth every evening.   . Omega-3 Fatty Acids (FISH OIL) 1000 MG CAPS Take 1 capsule by mouth daily.  Marland Kitchen rOPINIRole (REQUIP) 1 MG tablet Take 3 tablets (3 mg total) by mouth at  bedtime.  . sertraline (ZOLOFT) 100 MG tablet Take 1 tablet (100 mg total) by mouth daily.  . simvastatin (ZOCOR) 40 MG tablet Take 1 tablet (40 mg total) by mouth at bedtime.  . [DISCONTINUED] divalproex (DEPAKOTE) 125 MG DR tablet Take 1 tab at bed time for 1 week and than 2 tab at bed time (Patient not taking: Reported on 01/11/2015)  . [DISCONTINUED] doxycycline (VIBRA-TABS) 100 MG tablet Take 1 tablet (100 mg total) by mouth 2 (two) times daily. (Patient not taking: Reported on 01/11/2015)  . [DISCONTINUED] lisdexamfetamine (VYVANSE) 30 MG capsule Take 1 capsule (30 mg total) by mouth daily.  . [DISCONTINUED] lisdexamfetamine (VYVANSE) 30 MG capsule Take 1 capsule (30 mg total) by mouth daily.  . [DISCONTINUED] sertraline (ZOLOFT) 100 MG tablet Take 1 tablet (100 mg total) by mouth daily.   No facility-administered encounter medications on file as of 01/11/2015.    No results found for this or any previous visit (from the past 2160 hour(s)).    Physical Exam: Constitutional:  BP 118/54 mmHg  Pulse 80  Ht 5' 3.5" (1.613 m)  Wt 144 lb 6.4 oz (65.499 kg)  BMI 25.17 kg/m2  Musculoskeletal: Strength & Muscle Tone: within normal limits Gait & Station: normal Patient leans: Patient has a normal posture  Mental Status Examination;  Patient is groomed and well dressed female who appears to be her stated age.  Her speech is fast but coherent.  She described her mood euthymic and her affect is labile.  Her attention concentration is fair.  She denies any auditory or visual hallucination.  She denies any active or passive suicidal thoughts or homicidal thoughts.  There were no paranoia or any delusions present at this time.  Her fund of knowledge is average.  Her thought process is circumstantial.  There were no flight of ideas or loose association.  Her psychomotor activity is increased. She is alert and oriented x3 however difficulty remembering things..  Her insight judgment and impulse control  is okay.   Established Problem, Stable/Improving (1), Review and summation of old records (2), Review of Last Therapy Session (1), Review of Medication Regimen & Side Effects (2) and Review of New Medication or Change in Dosage (2)  Assessment: Axis I: ADD inattentive type, bipolar disorder type I, major depressive disorder recurrent mild, cognitive disorder NOS  Axis II: Deferred  Axis III:  Past Medical History  Diagnosis Date  . Irregular heart beat   . Depression   . Stroke 2006  . Cancer     skin  . Carotid stenosis     s/p R CEA 2006  . Cataract     s/p surgery 2007  . Memory loss    Plan:  Patient never filled Depakote.  Daughter believes she is doing much better with low-dose Vyvanse and Zoloft.  We will defer adding any more medication at this time.  Encouraged to schedule appointment with neurologist.  I will continue Vyvanse 30 mg daily and Zoloft 100 mg daily.  She does not have any tremors, shakes or any side effects.  She is  taking Aricept 5 mg from neurologist.  Recommended to call us back if she has any question or any concern.  Follow-up in 3 months.   Shanon Becvar T., MD 01/11/2015

## 2015-02-08 ENCOUNTER — Other Ambulatory Visit: Payer: Self-pay | Admitting: Neurology

## 2015-02-08 ENCOUNTER — Other Ambulatory Visit (HOSPITAL_COMMUNITY): Payer: Self-pay | Admitting: Psychiatry

## 2015-02-08 NOTE — Telephone Encounter (Signed)
Rx sent 

## 2015-02-09 ENCOUNTER — Other Ambulatory Visit (HOSPITAL_COMMUNITY): Payer: Self-pay | Admitting: Psychiatry

## 2015-02-09 NOTE — Telephone Encounter (Signed)
Given on 89/916 with refill, too soon to refill

## 2015-03-17 ENCOUNTER — Other Ambulatory Visit: Payer: Self-pay | Admitting: *Deleted

## 2015-03-17 MED ORDER — DONEPEZIL HCL 5 MG PO TABS: ORAL_TABLET | ORAL | Status: DC

## 2015-03-21 ENCOUNTER — Telehealth (HOSPITAL_COMMUNITY): Payer: Self-pay

## 2015-03-21 ENCOUNTER — Other Ambulatory Visit (HOSPITAL_COMMUNITY): Payer: Self-pay | Admitting: Psychiatry

## 2015-03-21 DIAGNOSIS — F988 Other specified behavioral and emotional disorders with onset usually occurring in childhood and adolescence: Secondary | ICD-10-CM

## 2015-03-21 DIAGNOSIS — F310 Bipolar disorder, current episode hypomanic: Secondary | ICD-10-CM

## 2015-03-21 MED ORDER — SERTRALINE HCL 100 MG PO TABS: 100.0000 mg | ORAL_TABLET | Freq: Every day | ORAL | Status: DC

## 2015-03-21 NOTE — Telephone Encounter (Signed)
[place] refill - Telephone message received from [facility] requesting a refill of pt's prescribed Sertraline, last ordered on 01/11/15 plus one refill but patient is not scheduled to return until 04/13/15.

## 2015-04-06 ENCOUNTER — Telehealth (HOSPITAL_COMMUNITY): Payer: Self-pay

## 2015-04-06 ENCOUNTER — Other Ambulatory Visit (HOSPITAL_COMMUNITY): Payer: Self-pay | Admitting: Psychiatry

## 2015-04-06 DIAGNOSIS — F988 Other specified behavioral and emotional disorders with onset usually occurring in childhood and adolescence: Secondary | ICD-10-CM

## 2015-04-06 MED ORDER — LISDEXAMFETAMINE DIMESYLATE 30 MG PO CAPS: 30.0000 mg | ORAL_CAPSULE | Freq: Every day | ORAL | Status: DC

## 2015-04-06 NOTE — Telephone Encounter (Signed)
Medication refill request for Vyvanse - Ms. Ids called to request a refill of patient's prescribed Vyvanse as was last to be filled after 02/20/15 and patient does not return until 04/13/15.

## 2015-04-06 NOTE — Telephone Encounter (Signed)
04/06/15 4:45pm Pt's daughter - Elzabeth Mcquerry BE#[id number] came and pick-up rx script.Marland KitchenMariana Kaufman

## 2015-04-09 ENCOUNTER — Other Ambulatory Visit: Payer: Self-pay | Admitting: Family Medicine

## 2015-04-11 ENCOUNTER — Ambulatory Visit (INDEPENDENT_AMBULATORY_CARE_PROVIDER_SITE_OTHER): Payer: Commercial Managed Care - HMO | Admitting: Family Medicine

## 2015-04-11 ENCOUNTER — Encounter: Payer: Self-pay | Admitting: Family Medicine

## 2015-04-11 VITALS — BP 120/51 | HR 79 | Temp 98.3°F | Ht 63.5 in | Wt 145.5 lb

## 2015-04-11 DIAGNOSIS — J029 Acute pharyngitis, unspecified: Secondary | ICD-10-CM | POA: Diagnosis not present

## 2015-04-11 LAB — POCT RAPID STREP A (OFFICE): Rapid Strep A Screen: NEGATIVE

## 2015-04-11 NOTE — Telephone Encounter (Signed)
Rx filled.  Algis Greenhouse. Jerline Pain, [place] Resident PGY-2 04/11/2015 9:20 AM

## 2015-04-11 NOTE — Patient Instructions (Signed)
This is likely viral If you have fever documented at home (100.4 or higher) please call us and we can send in tamiflu in case this is flu Drink plenty of fluids Nasal spray, lozenges  Follow up as needed or if getting worse  Be well, Dr. Ardelia Mems   Upper Respiratory Infection, Adult Most upper respiratory infections (URIs) are a viral infection of the air passages leading to the lungs. A URI affects the nose, throat, and upper air passages. The most common type of URI is nasopharyngitis and is typically referred to as "the common cold." URIs run their course and usually go away on their own. Most of the time, a URI does not require medical attention, but sometimes a bacterial infection in the upper airways can follow a viral infection. This is called a secondary infection. Sinus and middle ear infections are common types of secondary upper respiratory infections. Bacterial pneumonia can also complicate a URI. A URI can worsen asthma and chronic obstructive pulmonary disease (COPD). Sometimes, these complications can require emergency medical care and may be life threatening.  CAUSES Almost all URIs are caused by viruses. A virus is a type of germ and can spread from one person to another.  RISKS FACTORS You may be at risk for a URI if:   You smoke.   You have chronic heart or lung disease.  You have a weakened defense (immune) system.   You are very young or very old.   You have nasal allergies or asthma.  You work in crowded or poorly ventilated areas.  You work in health care facilities or schools. SIGNS AND SYMPTOMS  Symptoms typically develop 2-3 days after you come in contact with a cold virus. Most viral URIs last 7-10 days. However, viral URIs from the influenza virus (flu virus) can last 14-18 days and are typically more severe. Symptoms may include:   Runny or stuffy (congested) nose.   Sneezing.   Cough.   Sore throat.   Headache.   Fatigue.   Fever.    Loss of appetite.   Pain in your forehead, behind your eyes, and over your cheekbones (sinus pain).  Muscle aches.  DIAGNOSIS  Your health care provider may diagnose a URI by:  Physical exam.  Tests to check that your symptoms are not due to another condition such as:  Strep throat.  Sinusitis.  Pneumonia.  Asthma. TREATMENT  A URI goes away on its own with time. It cannot be cured with medicines, but medicines may be prescribed or recommended to relieve symptoms. Medicines may help:  Reduce your fever.  Reduce your cough.  Relieve nasal congestion. HOME CARE INSTRUCTIONS   Take medicines only as directed by your health care provider.   Gargle warm saltwater or take cough drops to comfort your throat as directed by your health care provider.  Use a warm mist humidifier or inhale steam from a shower to increase air moisture. This may make it easier to breathe.  Drink enough fluid to keep your urine clear or pale yellow.   Eat soups and other clear broths and maintain good nutrition.   Rest as needed.   Return to work when your temperature has returned to normal or as your health care provider advises. You may need to stay home longer to avoid infecting others. You can also use a face mask and careful hand washing to prevent spread of the virus.  Increase the usage of your inhaler if you have asthma.  Do not use any tobacco products, including cigarettes, chewing tobacco, or electronic cigarettes. If you need help quitting, ask your health care provider. PREVENTION  The best way to protect yourself from getting a cold is to practice good hygiene.   Avoid oral or hand contact with people with cold symptoms.   Wash your hands often if contact occurs.  There is no clear evidence that vitamin C, vitamin E, echinacea, or exercise reduces the chance of developing a cold. However, it is always recommended to get plenty of rest, exercise, and practice good  nutrition.  SEEK MEDICAL CARE IF:   You are getting worse rather than better.   Your symptoms are not controlled by medicine.   You have chills.  You have worsening shortness of breath.  You have brown or red mucus.  You have yellow or brown nasal discharge.  You have pain in your face, especially when you bend forward.  You have a fever.  You have swollen neck glands.  You have pain while swallowing.  You have white areas in the back of your throat. SEEK IMMEDIATE MEDICAL CARE IF:   You have severe or persistent:  Headache.  Ear pain.  Sinus pain.  Chest pain.  You have chronic lung disease and any of the following:  Wheezing.  Prolonged cough.  Coughing up blood.  A change in your usual mucus.  You have a stiff neck.  You have changes in your:  Vision.  Hearing.  Thinking.  Mood. MAKE SURE YOU:   Understand these instructions.  Will watch your condition.  Will get help right away if you are not doing well or get worse.   This information is not intended to replace advice given to you by your health care provider. Make sure you discuss any questions you have with your health care provider.   Document Released: 11/14/2000 Document Revised: 10/05/2014 Document Reviewed: 08/26/2013 Elsevier Interactive Patient Education [employer].

## 2015-04-11 NOTE — Progress Notes (Signed)
Patient ID: Brittany Chung, female   DOB: Aug 25, 1940, 74 y.o.   MRN: [id number]   Subjective: CC:  Sore throat HPI: This history was provided by the patient.  Patient's PMH is significant for HTN, DM type II, legionella PNA, hyperlipidemia, skin cancer, CVA and depression. Patient is a 74 y.o. female presenting to a same day clinic appointment to be evaluated for a sore throat and non-productive that started last night.  Patient's 52 year-old granddaughter, who lives with her, also had acute onset sore throat yesterday.  Patient also endorses right ear fullness.  Patient states last night with on-set of sore throat, she had an episode of chills and generalized body aches.  Patient gargled with salt water with some relief of throat pain and has also been taking NSAIDS for body aches.  Patient did not measure her temperature.  Patient denies rhinorrhea, nasal congestion, N/V/D, wheezing, SOB, chest pain, and abdominal pain.  Concerns today include:  1. Sore throat 2. Non-productive cough  Social History Reviewed: Never smoker. FamHx and MedHx updated.  Please see EMR.  ROS: Per HPI   Objective: Office vital signs reviewed. BP 120/51 mmHg  Pulse 79  Temp(Src) 98.3 F (36.8 C) (Oral)  Ht 5' 3.5" (1.613 m)  Wt 145 lb 8 oz (65.998 kg)  BMI 25.37 kg/m2  Physical Examination:  General: Pleasant elderly woman who appears stated age.  Awake, alert, well-nourished, NAD. HEENT: Normal    Neck: No masses palpated. No LAD    Ears: TMs intact, normal light reflex, no erythema, no bulging, no otorrhea.    Eyes: Sclera white with no injection.  No drainage noted.    Nose: nasal turbinates moist, no injection, no rhinorrhea, no pain with palpation of maxillary or frontal sinuses    Throat: MMM, mild erythema, mildly swollen uvula, no pustules noted to oropharynx Cardio: RRR, S1S2 heard, no murmurs appreciated, No edema, cyanosis or clubbing; +2 radial and dorsalis pedis pulses bilaterally Pulm:  CTAB, no wheezes, rhonchi or rales GI: soft, NT/ND,+BS x4 Skin: dry, intact, no rashes or lesions   Assessment/ Plan: 74 y.o. female presenting with sore throat and non-productive cough for less than 24 hours.  Patient's exam unremarkable except for mild erythema and swelling of uvula.  Patient reports subjective fever last night that has since resolved and denies other systemic signs of bacterial infection.  Rapid strep performed in clinic was negative.  Patient's exam and symptoms consistent with viral illness.    Patient was given instructions to contact clinic if she develops a fever (temperature >100.4) at home within the next several days, so that we may begin a course of Tamiflu in case this is the flu.    1. Sore throat  - Rapid Strep A - negative  Follow-up Follow-up with clinic if you develop a fever (temp >100.4) or other signs of systemic illness (N/V/D, productive cough, wheezing).    Sherron Ales, NP Student [facility]  ------------------------------------------------------------------------------------------------ Attending note:  Date of Visit: 04/11/2015   HPI:  Pt presents for a same day appointment to discuss sore throat.  Began having sore throat last night. Had mild nonproductive cough. Has done some gargles which helped. No objective fevers but had chills last night. No nasal drainage. Also with some R ear pain/fullness. Has family member with recent URI.    ROS: See HPI  [place]: ADHD, bipolar disorder, glaucoma, type 2 diabetes, hyperlipidemia, hypertension, genital herpes, IBS  PHYSICAL EXAM: BP 120/51 mmHg  Pulse 79  Temp(Src) 98.3 F (36.8 C) (Oral)  Ht 5' 3.5" (1.613 m)  Wt 145 lb 8 oz (65.998 kg)  BMI 25.37 kg/m2 Gen: NAD, pleasant, cooperative HEENT: normocephalic, atraumatic. Nares patent. Uvula midline and appears mildly edematous. No trismus or tonsillar swelling. Mild anterior cervical lymphadenopathy. Airway patent, normal voice  quality. Mouth moist. Heart: regular rate and rhythm no murmur Lungs: clear to auscultation bilaterally, normal work of breathing. No stridor Neuro: grossly nonfocal, speech normal, alert and interactive  ASSESSMENT/PLAN:  74 yo F presenting with sore throat, onset last night. Due to mild uvular swelling, rapid strep obtained and was negative. Airway is open without any respiratory compromise. Suspect viral process, recommend supportive care. Discussed return precautions. Also recommended calling us if she has a documented fever, as could represent influenza and would benefit from early tamiflu administration. Follow up as needed.   FOLLOW UP: F/u as needed if symptoms worsen or do not improve.   [place]. Ardelia Mems, [place]

## 2015-04-13 ENCOUNTER — Ambulatory Visit (INDEPENDENT_AMBULATORY_CARE_PROVIDER_SITE_OTHER): Payer: Medicare HMO | Admitting: Psychiatry

## 2015-04-13 ENCOUNTER — Encounter (HOSPITAL_COMMUNITY): Payer: Self-pay | Admitting: Psychiatry

## 2015-04-13 VITALS — BP 140/70 | Wt 147.0 lb

## 2015-04-13 DIAGNOSIS — F909 Attention-deficit hyperactivity disorder, unspecified type: Secondary | ICD-10-CM | POA: Diagnosis not present

## 2015-04-13 DIAGNOSIS — F988 Other specified behavioral and emotional disorders with onset usually occurring in childhood and adolescence: Secondary | ICD-10-CM

## 2015-04-13 DIAGNOSIS — F310 Bipolar disorder, current episode hypomanic: Secondary | ICD-10-CM | POA: Diagnosis not present

## 2015-04-13 MED ORDER — LISDEXAMFETAMINE DIMESYLATE 30 MG PO CAPS: 30.0000 mg | ORAL_CAPSULE | Freq: Every day | ORAL | Status: DC

## 2015-04-13 MED ORDER — SERTRALINE HCL 100 MG PO TABS: 100.0000 mg | ORAL_TABLET | Freq: Every day | ORAL | Status: DC

## 2015-04-13 NOTE — Progress Notes (Signed)
[place] Progress Note  Brittany Chung [id number] 74 y.o.  04/13/2015 10:42 AM  Chief Complaint:  Medication management and follow-up.             History of Present Illness:  Lyrical came for her followup appointment.  She had a strep throat and she recently seen her primary care physician.  Overall she described her mood is a stable.  She denies any irritability, anger, mood swing.  Her sleep is good.  Her attention and focus is good she is able to do multitasking.  Usually her daughter comes with her appointment today her daughter was busy.  Patient reported her medicine is working very well.  She still have memory issues but they are under control.  She has no tremors, shakes or any dizziness.  She denies any feeling of hopelessness or worthlessness.  Patient denies drinking or using any illegal substances.  She lives with her daughter and her 5 grandkids.  She is excited about upcoming Thanksgiving.  Suicidal Ideation: No Plan Formed: No Patient has means to carry out plan: No  Homicidal Ideation: No Plan Formed: No Patient has means to carry out plan: No  Medical History; Patient has history of irregular heartbeat, stroke, cardiac stenosis, hypertension and diabetes mellitus .  She goes to [facility].      Past Psychiatric History/Hospitalization(s) Patient denies any history of suicidal attempt, inpatient psychiatric treatment of any psychosis.  As per daughter she has history of bipolar disorder but she feels embarrassed to accept it.  She was given Seroquel by her primary care physician but she stopped taking it because of sedation.   She was taking Remeron which was stopped when Seroquel was started a few months ago. Anxiety: Yes Bipolar Disorder: Yes Depression: Yes Mania: No Psychosis: No Schizophrenia: No Personality Disorder: No Hospitalization for psychiatric illness: No History of Electroconvulsive Shock Therapy: No Prior  Suicide Attempts: No   Review of Systems  Eyes: Negative for blurred vision.  Cardiovascular: Negative for palpitations.  Skin: Negative for itching and rash.  Neurological: Negative for dizziness and tremors.    Psychiatric: Agitation: No Hallucination: No Depressed Mood: No Insomnia: No Hypersomnia: No Altered Concentration: No Feels Worthless: No Grandiose Ideas: No Belief In Special Powers: No New/Increased Substance Abuse: No Compulsions: No  Neurologic: Headache: No Seizure: No Paresthesias: No    Outpatient Encounter Prescriptions as of 04/13/2015  Medication Sig  . aspirin 81 MG chewable tablet Chew 81 mg by mouth every morning.   Marland Kitchen b complex vitamins tablet Take 1 tablet by mouth daily.  . bimatoprost (LUMIGAN) 0.03 % ophthalmic drops Place 1 drop into both eyes at bedtime.    . Coenzyme Q10 (CO Q-10) 100 MG CAPS Take by mouth.  . donepezil (ARICEPT) 5 MG tablet TAKE 1 TABLET (5 MG TOTAL) BY MOUTH AT BEDTIME.  . enalapril (VASOTEC) 2.5 MG tablet Take 1 tablet (2.5 mg total) by mouth daily.  Donnie Aho (MSM GLUCOSAMINE COMPLEX PO) Take by mouth.  . Incontinence Supplies MISC Use as directed  . Incontinence Supply Disposable (REALITY INCONTINENT BRIEFS SM) MISC Use as needed.  Marland Kitchen JANUVIA 100 MG tablet TAKE 1 TABLET BY MOUTH EVERY DAY  . lisdexamfetamine (VYVANSE) 30 MG capsule Take 1 capsule (30 mg total) by mouth daily.  . metFORMIN (GLUCOPHAGE) 850 MG tablet TAKE 1 TABLET (850 MG TOTAL) BY MOUTH 2 (TWO) TIMES DAILY WITH A MEAL.  . Multiple Vitamins-Minerals (MULTIVITAMIN WITH MINERALS) tablet Take  1 tablet by mouth every evening.   . Omega-3 Fatty Acids (FISH OIL) 1000 MG CAPS Take 1 capsule by mouth daily.  Marland Kitchen rOPINIRole (REQUIP) 1 MG tablet Take 3 tablets (3 mg total) by mouth at bedtime.  . sertraline (ZOLOFT) 100 MG tablet Take 1 tablet (100 mg total) by mouth daily.  . simvastatin (ZOCOR) 40 MG tablet Take 1 tablet (40 mg total) by mouth  at bedtime.  . [DISCONTINUED] lisdexamfetamine (VYVANSE) 30 MG capsule Take 1 capsule (30 mg total) by mouth daily.  . [DISCONTINUED] sertraline (ZOLOFT) 100 MG tablet Take 1 tablet (100 mg total) by mouth daily.   No facility-administered encounter medications on file as of 04/13/2015.    Recent Results (from the past 2160 hour(s))  Rapid Strep A     Status: None   Collection Time: 04/11/15 12:11 PM  Result Value Ref Range   Rapid Strep A Screen Negative Negative      Physical Exam: Constitutional:  BP 140/70 mmHg  Wt 147 lb (66.679 kg)  Musculoskeletal: Strength & Muscle Tone: within normal limits Gait & Station: normal Patient leans: Patient has a normal posture  Mental Status Examination;  Patient is groomed and well dressed female who appears to be her stated age.  Her speech is fast but coherent.  She is keeping a mask due to strep throat .  She described her mood euthymic and her affect is labile.  Her attention concentration is fair.  She denies any auditory or visual hallucination.  She denies any active or passive suicidal thoughts or homicidal thoughts.  There were no paranoia or any delusions present at this time.  Her fund of knowledge is average.  Her thought process is circumstantial.  There were no flight of ideas or loose association.  Her psychomotor activity is increased. She is alert and oriented x3 however difficulty remembering things..  Her insight judgment and impulse control is okay.   Established Problem, Stable/Improving (1), Review of Last Therapy Session (1) and Review of Medication Regimen & Side Effects (2)  Assessment: Axis I: ADD inattentive type, bipolar disorder type I, major depressive disorder recurrent mild, cognitive disorder NOS  Axis II: Deferred  Axis III:  Past Medical History  Diagnosis Date  . Irregular heart beat   . Depression   . Stroke [facility]) 2006  . Cancer ([place])     skin  . Carotid stenosis     s/p R CEA 2006  . Cataract      s/p surgery 2007  . Memory loss    Plan:  I encouraged to see neurologist if memory impairment continues to get worse.  Overall she is doing better on Vyvanse and Zoloft.  Discussed medication side effects and benefits.  I will continue Vyvanse 30 mg daily and Zoloft 100 mg daily.  Recommended to call us back if she has any question or any concern.  Follow-up in 3 months.  She is taking Aricept 5 mg from neurologist. Follow-up in 3 months.   Aveah Castell T., MD 04/13/2015

## 2015-04-15 ENCOUNTER — Telehealth: Payer: Self-pay | Admitting: *Deleted

## 2015-04-15 ENCOUNTER — Ambulatory Visit (INDEPENDENT_AMBULATORY_CARE_PROVIDER_SITE_OTHER): Payer: Commercial Managed Care - HMO | Admitting: Student

## 2015-04-15 VITALS — BP 144/64 | HR 88 | Temp 98.0°F | Wt 144.7 lb

## 2015-04-15 DIAGNOSIS — J069 Acute upper respiratory infection, unspecified: Secondary | ICD-10-CM

## 2015-04-15 MED ORDER — BENZONATATE 200 MG PO CAPS: 200.0000 mg | ORAL_CAPSULE | Freq: Three times a day (TID) | ORAL | Status: DC | PRN

## 2015-04-15 NOTE — Assessment & Plan Note (Signed)
Cough, congestion without fever or SOB consistent with viral URI - Tessalon pearls prescribed for cough - Guadalupe Dawn will continue supportive management - Return precautions given

## 2015-04-15 NOTE — Progress Notes (Signed)
   Subjective:    Patient ID: Brittany Chung, female    DOB: 1941/02/01, 74 y.o.   MRN: [id number]   CC: worsening cough  HPI  74 y/o presenting for cough which has been on going for 6 days and she feels has worsened  Cough - Previously seen for cold like symptoms 4 days ago - Cough has become productive of green sputum from clear sputum and patient is concerned - Again reports her 34 y/o daughter and one of her grandchildren had very similar symtoms recently - Has not had fever, chills, Nausea/vomitting, or diarrhea - Denies SOB, chest pain  Review of Systems   See HPI for ROS.   Past Medical History  Diagnosis Date  . Irregular heart beat   . Depression   . Stroke [facility]) 2006  . Cancer ([place])     skin  . Carotid stenosis     s/p R CEA 2006  . Cataract     s/p surgery 2007  . Memory loss    Past Surgical History  Procedure Laterality Date  . Coronary angioplasty    . Basal cell carcinoma excision  02/18/2003  . Carotid endarterectomy  05/04/2005    R stenosis >80% on 03/16/2005  . Glaucoma surgery  06/25/2005  . Cataract extraction     OB History    Gravida Para Term Preterm AB TAB SAB Ectopic Multiple Living   7 6 6  1  1         Social History   Social History  . Marital Status: Married    Spouse Name: N/A  . Number of Children: N/A  . Years of Education: N/A   Occupational History  . Not on file.   Social History Main Topics  . Smoking status: Never Smoker   . Smokeless tobacco: Never Used  . Alcohol Use: No  . Drug Use: No  . Sexual Activity: Not Currently   Other Topics Concern  . Not on file   Social History Narrative   Lives with: daughter and  5 grandchildren   Hobbies: ballroom dance   Religion: [employer]             Objective:  BP 144/64 mmHg  Pulse 88  Temp(Src) 98 F (36.7 C) (Oral)  Wt 144 lb 11.2 oz (65.635 kg) Vitals and nursing note reviewed  General: NAD Cardiac: RRR,  Respiratory: CTAB, normal WOB, no wheezes  auscultated Extremities: no LE edema or tenderness Skin: warm and dry, no rashes noted Neuro: alert and oriented, no focal deficits   Assessment & Plan:    URI (upper respiratory infection) Cough, congestion without fever or SOB consistent with viral URI - Tessalon pearls prescribed for cough - Guadalupe Dawn will continue supportive management - Return precautions given     Rynell Ciotti A. Lincoln Brigham MD, [facility] Resident PGY-1 Pager [phone]  hh

## 2015-04-15 NOTE — Telephone Encounter (Signed)
Patient called stating that she was already seen for cold symptoms.  Her symptoms are not getting any better.  She coughing up green mucous and hoarse.  Advised patient that we don't have any appointments today, however we would try and work her in.  Patient advised that she will be have to wait.  Derl Barrow, RN

## 2015-04-15 NOTE — Patient Instructions (Signed)
Follow up with PCP If you have questions or concerns call the office at [phone]

## 2015-04-20 ENCOUNTER — Ambulatory Visit: Payer: Self-pay | Admitting: Neurology

## 2015-05-12 ENCOUNTER — Other Ambulatory Visit (HOSPITAL_COMMUNITY): Payer: Self-pay | Admitting: Psychiatry

## 2015-05-14 ENCOUNTER — Other Ambulatory Visit: Payer: Self-pay | Admitting: Family Medicine

## 2015-05-16 NOTE — Telephone Encounter (Signed)
Rx filled.  Algis Greenhouse. Jerline Pain, [place] Resident PGY-2 05/16/2015 8:30 AM

## 2015-05-19 ENCOUNTER — Telehealth (HOSPITAL_COMMUNITY): Payer: Self-pay

## 2015-05-19 NOTE — Telephone Encounter (Signed)
Telephone call with Jonelle Sidle, pharmacist at [facility] on [facility] to verify they had received patient's Zoloft prescription order e-scribed on 04/13/15 plus 2 refills as our facility keeps receiving faxed refill requests.  Order was confirmed received.

## 2015-06-03 ENCOUNTER — Emergency Department (HOSPITAL_COMMUNITY): Payer: Commercial Managed Care - HMO

## 2015-06-03 ENCOUNTER — Inpatient Hospital Stay (HOSPITAL_COMMUNITY)
Admission: EM | Admit: 2015-06-03 | Discharge: 2015-06-05 | DRG: 069 | Disposition: A | Discharge status: Home / Self Care | Payer: Commercial Managed Care - HMO | Attending: Family Medicine | Admitting: Family Medicine

## 2015-06-03 ENCOUNTER — Encounter (HOSPITAL_COMMUNITY): Payer: Self-pay

## 2015-06-03 DIAGNOSIS — F319 Bipolar disorder, unspecified: Secondary | ICD-10-CM | POA: Diagnosis present

## 2015-06-03 DIAGNOSIS — R4781 Slurred speech: Secondary | ICD-10-CM

## 2015-06-03 DIAGNOSIS — G459 Transient cerebral ischemic attack, unspecified: Principal | ICD-10-CM | POA: Diagnosis present

## 2015-06-03 DIAGNOSIS — Z79899 Other long term (current) drug therapy: Secondary | ICD-10-CM

## 2015-06-03 DIAGNOSIS — Z7982 Long term (current) use of aspirin: Secondary | ICD-10-CM

## 2015-06-03 DIAGNOSIS — E785 Hyperlipidemia, unspecified: Secondary | ICD-10-CM | POA: Diagnosis present

## 2015-06-03 DIAGNOSIS — F909 Attention-deficit hyperactivity disorder, unspecified type: Secondary | ICD-10-CM | POA: Diagnosis present

## 2015-06-03 DIAGNOSIS — Z7984 Long term (current) use of oral hypoglycemic drugs: Secondary | ICD-10-CM

## 2015-06-03 DIAGNOSIS — Z881 Allergy status to other antibiotic agents status: Secondary | ICD-10-CM | POA: Diagnosis not present

## 2015-06-03 DIAGNOSIS — I110 Hypertensive heart disease with heart failure: Secondary | ICD-10-CM | POA: Diagnosis present

## 2015-06-03 DIAGNOSIS — E119 Type 2 diabetes mellitus without complications: Secondary | ICD-10-CM | POA: Diagnosis present

## 2015-06-03 DIAGNOSIS — N179 Acute kidney failure, unspecified: Secondary | ICD-10-CM | POA: Diagnosis present

## 2015-06-03 DIAGNOSIS — Z888 Allergy status to other drugs, medicaments and biological substances status: Secondary | ICD-10-CM | POA: Diagnosis not present

## 2015-06-03 DIAGNOSIS — Z23 Encounter for immunization: Secondary | ICD-10-CM

## 2015-06-03 DIAGNOSIS — Z885 Allergy status to narcotic agent status: Secondary | ICD-10-CM

## 2015-06-03 DIAGNOSIS — I5030 Unspecified diastolic (congestive) heart failure: Secondary | ICD-10-CM | POA: Diagnosis present

## 2015-06-03 DIAGNOSIS — Z8673 Personal history of transient ischemic attack (TIA), and cerebral infarction without residual deficits: Secondary | ICD-10-CM | POA: Diagnosis not present

## 2015-06-03 DIAGNOSIS — R1310 Dysphagia, unspecified: Secondary | ICD-10-CM | POA: Diagnosis present

## 2015-06-03 DIAGNOSIS — F3162 Bipolar disorder, current episode mixed, moderate: Secondary | ICD-10-CM | POA: Diagnosis present

## 2015-06-03 DIAGNOSIS — I639 Cerebral infarction, unspecified: Secondary | ICD-10-CM | POA: Diagnosis not present

## 2015-06-03 LAB — URINE MICROSCOPIC-ADD ON

## 2015-06-03 LAB — CBC WITH DIFFERENTIAL/PLATELET
Basophils Absolute: 0 10*3/uL (ref 0.0–0.1)
Basophils Relative: 1 %
Eosinophils Absolute: 0.3 10*3/uL (ref 0.0–0.7)
Eosinophils Relative: 4 %
HEMATOCRIT: 33.3 % — AB (ref 36.0–46.0)
HEMOGLOBIN: 10.8 g/dL — AB (ref 12.0–15.0)
LYMPHS ABS: 4 10*3/uL (ref 0.7–4.0)
LYMPHS PCT: 51 %
MCH: 28.8 pg (ref 26.0–34.0)
MCHC: 32.4 g/dL (ref 30.0–36.0)
MCV: 88.8 fL (ref 78.0–100.0)
MONO ABS: 0.6 10*3/uL (ref 0.1–1.0)
Monocytes Relative: 8 %
NEUTROS ABS: 2.8 10*3/uL (ref 1.7–7.7)
NEUTROS PCT: 36 %
Platelets: 173 10*3/uL (ref 150–400)
RBC: 3.75 MIL/uL — ABNORMAL LOW (ref 3.87–5.11)
RDW: 13.7 % (ref 11.5–15.5)
WBC: 7.7 10*3/uL (ref 4.0–10.5)

## 2015-06-03 LAB — URINALYSIS, ROUTINE W REFLEX MICROSCOPIC
BILIRUBIN URINE: NEGATIVE
GLUCOSE, UA: NEGATIVE mg/dL
Ketones, ur: NEGATIVE mg/dL
Leukocytes, UA: NEGATIVE
NITRITE: NEGATIVE
PH: 6.5 (ref 5.0–8.0)
Protein, ur: NEGATIVE mg/dL
SPECIFIC GRAVITY, URINE: 1.007 (ref 1.005–1.030)

## 2015-06-03 LAB — BASIC METABOLIC PANEL
Anion gap: 7 (ref 5–15)
BUN: 18 mg/dL (ref 6–20)
CO2: 26 mmol/L (ref 22–32)
Calcium: 9.5 mg/dL (ref 8.9–10.3)
Chloride: 112 mmol/L — ABNORMAL HIGH (ref 101–111)
Creatinine, Ser: 1.12 mg/dL — ABNORMAL HIGH (ref 0.44–1.00)
GFR calc Af Amer: 54 mL/min — ABNORMAL LOW (ref >60)
GFR calc non Af Amer: 46 mL/min — ABNORMAL LOW (ref >60)
GLUCOSE: 122 mg/dL — AB (ref 65–99)
Potassium: 5 mmol/L (ref 3.5–5.1)
Sodium: 145 mmol/L (ref 135–145)

## 2015-06-03 LAB — CBG MONITORING, ED: Glucose-Capillary: 139 mg/dL — ABNORMAL HIGH (ref 65–99)

## 2015-06-03 MED ORDER — ROPINIROLE HCL 1 MG PO TABS
3.0000 mg | ORAL_TABLET | Freq: Every day | ORAL | Status: DC
  Administered 2015-06-04: 3 mg via ORAL
  Filled 2015-06-03: qty 3

## 2015-06-03 MED ORDER — HEPARIN SODIUM (PORCINE) 5000 UNIT/ML IJ SOLN
5000.0000 [IU] | Freq: Three times a day (TID) | INTRAMUSCULAR | Status: DC
  Administered 2015-06-03 – 2015-06-05 (×6): 5000 [IU] via SUBCUTANEOUS
  Filled 2015-06-03 (×6): qty 1

## 2015-06-03 MED ORDER — SODIUM CHLORIDE 0.9 % IV BOLUS (SEPSIS)
1000.0000 mL | Freq: Once | INTRAVENOUS | Status: AC
  Administered 2015-06-03: 1000 mL via INTRAVENOUS

## 2015-06-03 MED ORDER — DEXTROSE-NACL 5-0.45 % IV SOLN
INTRAVENOUS | Status: DC
  Administered 2015-06-03: 22:00:00 via INTRAVENOUS

## 2015-06-03 MED ORDER — STROKE: EARLY STAGES OF RECOVERY BOOK
Freq: Once | Status: AC
  Administered 2015-06-03: 1

## 2015-06-03 MED ORDER — SIMVASTATIN 40 MG PO TABS
40.0000 mg | ORAL_TABLET | Freq: Every day | ORAL | Status: DC
  Administered 2015-06-04: 40 mg via ORAL
  Filled 2015-06-03: qty 1

## 2015-06-03 MED ORDER — LATANOPROST 0.005 % OP SOLN
1.0000 [drp] | Freq: Every day | OPHTHALMIC | Status: DC
  Administered 2015-06-03 – 2015-06-04 (×2): 1 [drp] via OPHTHALMIC
  Filled 2015-06-03: qty 2.5

## 2015-06-03 MED ORDER — SERTRALINE HCL 100 MG PO TABS
100.0000 mg | ORAL_TABLET | Freq: Every day | ORAL | Status: DC
  Administered 2015-06-04 – 2015-06-05 (×2): 100 mg via ORAL
  Filled 2015-06-03 (×2): qty 1

## 2015-06-03 MED ORDER — ASPIRIN 81 MG PO CHEW: 81.0000 mg | CHEWABLE_TABLET | Freq: Every morning | ORAL | Status: DC

## 2015-06-03 MED ORDER — DONEPEZIL HCL 5 MG PO TABS
5.0000 mg | ORAL_TABLET | Freq: Every day | ORAL | Status: DC
  Administered 2015-06-04: 5 mg via ORAL
  Filled 2015-06-03: qty 1

## 2015-06-03 NOTE — H&P (Signed)
[facility] Admission History and Physical Service Pager: [phone]  Patient name: Brittany Chung Medical record number: [id number] Date of birth: Dec 13, 1940 Age: 74 y.o. Gender: female  Primary Care Provider: Dimas Chyle, MD Consultants: Neurology Code Status: Full (per discussion on admission)  Chief Complaint: slurred speech  Assessment and Plan: Brittany Chung is a 74 y.o. female presenting with slurred speech and weakness that started earlier today . PMH is significant for CVA, carotid endarterectomy, type 2 diabetes mellitus, memory loss.  Slurred Speech/Weakness: Suspicious for TIA vs stroke. Patient is at risk due to hx of CVA in 2006 and previous carotid endarterectomy. Takes daily 81 mg ASA. Possible occlusion of extracranial or intracranial arteries. Neuro exam unremarkable except for minor slurred speech. CT head negative for acute process. EKG unremarkable for arrhythmia. Infectious causes unlikely; pt afebrile, WBC WNL, UA neg for nitrites or leukocytes. Currently hemodynamically stable.  - Admit to inpatient family medicine teaching service, attending Dr. Mingo Amber - SLP eval and treat - MRI brain wo contrast - Echocardiogram - Labs: Hgb A1C and Lipid Panel -  ASA - Permissive hypertension; hold home enalapril  - Neurology consulted, appreciate assistance  Psych: Depression and ADHD: No depressive symptoms at this time.  - Continue home Zoloft 100 mg daily - Hold home Vyvance  Mild Cognitive Impairment: Follows with Dr. McDiarmid at geriatric clinic - Continue home Aricept  Hyperlipidemia: Controlled. Last Lipid Panel on 09/28/14: Cholesterol 180, Triglycerides 140, HDL 67, LDL 85 - Lipid Panel ordered - Continue home Simvastatin 40 mg daily  Diabetes: Relatively controlled. Last Hgb A1c on 09/28/14 was 7.1. Glucose slightly elevated today at 122. - Hgb A1C ordered - Hold home Metformin and Januvia    FEN/GI: SLIV, NPO (failed  swallow screen) Prophylaxis: Heparin  Disposition: Admit to inpatient family medicine teaching service, discharge home pending workup  History of Present Illness:  Brittany Chung is a 74 y.o. female presenting with slurred speech. History partly from daughter Brittany Chung. Patient states she had difficulty waking up this morning (says this is sometimes normal for her -- hard to wake herself up during the morning and sometimes goes back to sleep). She noted that when she woke up she did not "feel well" -- later says this was her whole body feeling weak. She had ate breakfast and went back to bed before she really noticed any symptoms. She was having a nightmare and noted that her surroundings were much different. She had gotten up to go to the bathroom and was having difficulty moving her legs so she called out for help. She was confused around this time (but knows that she was confused). 911 was then called. She felt her mouth was numb and had difficulty forming her words. In total she felt like her difficulty with word finding lasted a few hours. She denies falling or losing consciousness.   She does endorse having trouble catching her breath and fast heart rate this morning during the whole situation  Daughter thinks she has been unusually sleepy since arriving to the ED. Feel her speech has been improving but still sounds slurred.  She has IBS and incontinence at baseline.   Reports doing most of her IADLs and ADLs, still drives, shops, cooks, cleans.   Review Of Systems: Per HPI with the following additions: no abdominal pain, no diarrhea, normally constipated, no muscle aches or pains (does have restless legs) Otherwise the remainder of the systems were negative.  Patient Active Problem List  Diagnosis Date Noted  . Slurred speech 06/03/2015  . URI (upper respiratory infection) 04/15/2015  . Right shoulder pain 11/30/2014  . Healthcare maintenance 09/28/2014  . Incontinence in female  03/26/2014  . Hearing loss 03/26/2014  . Mild cognitive impairment 02/23/2014  . Driving safety issue 02/23/2014  . Legionella pneumonia ([place]) 02/09/2014  . Neck pain 01/05/2014  . Stuttering 10/21/2013  . S/P carotid endarterectomy 04/01/2013  . Restless leg syndrome 01/18/2012  . Vitamin D deficiency 01/18/2012  . Chronic narrow angle glaucoma 10/12/2011  . Right knee pain 09/20/2011  . Counseling regarding advanced directives 08/18/2010  . ADHD 03/16/2010  . BIPOLAR DISORDER UNSPECIFIED 06/22/2009  . HERPES, GENITAL NOS 02/24/2007  . Diabetes mellitus, type II 10/01/2006  . HYPERLIPIDEMIA 10/01/2006  . HYPERTENSION, BENIGN ESSENTIAL 10/01/2006  . Irritable bowel syndrome 09/19/2006    Past Medical History: Past Medical History  Diagnosis Date  . Irregular heart beat   . Depression   . Stroke [facility]) 2006  . Cancer ([place])     skin  . Carotid stenosis     s/p R CEA 2006  . Cataract     s/p surgery 2007  . Memory loss     Past Surgical History: Past Surgical History  Procedure Laterality Date  . Coronary angioplasty    . Basal cell carcinoma excision  02/18/2003  . Carotid endarterectomy  05/04/2005    R stenosis >80% on 03/16/2005  . Glaucoma surgery  06/25/2005  . Cataract extraction      Social History: Social History  Substance Use Topics  . Smoking status: Never Smoker   . Smokeless tobacco: Never Used  . Alcohol Use: No   Additional social history: lives with other daughter.  Please also refer to relevant sections of EMR.  Family History: Family History  Problem Relation Age of Onset  . Cancer Mother     Breast  . Aneurysm Father   . Cancer Sister     lung cancer  . Cancer Daughter     colon cancer  . Diabetes Maternal Grandmother   . Cancer Maternal Grandfather     lung  . Cancer Paternal Grandmother     mouth    Allergies and Medications: Allergies  Allergen Reactions  . Codeine     REACTION: nausea/vomiting  . Neosporin  [Neomycin-Bacitracin Zn-Polymyx]   . Petroleum Jelly [Petrolatum]   . Caine-1 [Lidocaine Hcl] Rash  . Clindamycin/Lincomycin Swelling  . Isothiazolinone Chloride Rash  . Neomycin Sulfate Rash  . Phenylene Rash    Phenylenediamine  . Quaternium-15 Rash   No current facility-administered medications on file prior to encounter.   Current Outpatient Prescriptions on File Prior to Encounter  Medication Sig Dispense Refill  . aspirin 81 MG chewable tablet Chew 81 mg by mouth every morning.     Marland Kitchen b complex vitamins tablet Take 1 tablet by mouth daily.    . bimatoprost (LUMIGAN) 0.03 % ophthalmic drops Place 1 drop into both eyes at bedtime.      . Coenzyme Q10 (CO Q-10) 100 MG CAPS Take by mouth.    . donepezil (ARICEPT) 5 MG tablet TAKE 1 TABLET (5 MG TOTAL) BY MOUTH AT BEDTIME. 30 tablet 3  . enalapril (VASOTEC) 2.5 MG tablet TAKE 1 TABLET BY MOUTH EVERY DAY 90 tablet 3  . Glucos-MSM-C-Mn-Ginger-Willow (MSM GLUCOSAMINE COMPLEX PO) Take by mouth.    Marland Kitchen JANUVIA 100 MG tablet TAKE 1 TABLET BY MOUTH EVERY DAY 30 tablet 2  . lisdexamfetamine (VYVANSE)  30 MG capsule Take 1 capsule (30 mg total) by mouth daily. 30 capsule 0  . metFORMIN (GLUCOPHAGE) 850 MG tablet TAKE 1 TABLET (850 MG TOTAL) BY MOUTH 2 (TWO) TIMES DAILY WITH A MEAL. 60 tablet 5  . Multiple Vitamins-Minerals (MULTIVITAMIN WITH MINERALS) tablet Take 1 tablet by mouth every evening.     . Omega-3 Fatty Acids (FISH OIL) 1000 MG CAPS Take 1 capsule by mouth daily.    Marland Kitchen rOPINIRole (REQUIP) 1 MG tablet Take 3 tablets (3 mg total) by mouth at bedtime. 180 tablet 3  . sertraline (ZOLOFT) 100 MG tablet Take 1 tablet (100 mg total) by mouth daily. 30 tablet 2  . simvastatin (ZOCOR) 40 MG tablet Take 1 tablet (40 mg total) by mouth at bedtime. 90 tablet 3  . Incontinence Supplies MISC Use as directed 1 each prn  . Incontinence Supply Disposable (REALITY INCONTINENT BRIEFS SM) MISC Use as needed. 1 each prn  . [DISCONTINUED] rOPINIRole (REQUIP)  1 MG tablet Take 1 tablet (1 mg total) by mouth at bedtime. 30 tablet 3    Objective: BP 111/57 mmHg  Pulse 64  Temp(Src) 97.9 F (36.6 C) (Oral)  Resp 14  SpO2 92% Exam: General: In NAD, alert and oriented, laying in bed, pleasant Eyes: PERRLA, some hyperpigmentation on sclera ENTM: MMM, missing several teeth Neck: supple Cardiovascular: RRR, normal s1 and s2, no rubs, gallops, or murmurs Respiratory: clear to auscultation bilaterally Abdomen: soft, non-distended, non-tender, normal bowel sounds, no masses palpated  MSK: full ROM of all joints  Skin: no rashes or bruises Neuro:  Mental Status: alert; oriented to person, place and year; knowledge is normal for age; language is normal Cranial Nerves: extraocular movements are full and conjugate; pupils are round reactive to light;  symmetric facial strength; midline tongue and uvula Motor: Normal strength, tone and mass; good fine motor movements; no pronator drift Sensory: intact throughout Gait and Station: deferred at this time Reflexes: symmetric bilaterally; no clonus; bilateral flexor plantar responses Psych: Normal mood and affect. Good insight  Labs and Imaging: CBC BMET   Recent Labs Lab 06/03/15 1523  WBC 7.7  HGB 10.8*  HCT 33.3*  PLT 173    Recent Labs Lab 06/03/15 1523  NA 145  K 5.0  CL 112*  CO2 26  BUN 18  CREATININE 1.12*  GLUCOSE 122*  CALCIUM 9.5     Ct Head Wo Contrast  06/03/2015  CLINICAL DATA:  Dizziness EXAM: CT HEAD WITHOUT CONTRAST TECHNIQUE: Contiguous axial images were obtained from the base of the skull through the vertex without intravenous contrast. COMPARISON:  10/26/2014 FINDINGS: Bony calvarium is intact. Mild atrophic changes are noted. Mild chronic white matter ischemic change is seen. No findings to suggest acute hemorrhage, acute infarct or space-occupying mass lesion are noted. IMPRESSION: Chronic atrophic and ischemic changes. No acute abnormality is noted. Electronically  Signed   By: Inez Catalina M.D.   On: 06/03/2015 14:45     Carlyle Dolly, MD 06/03/2015, 4:57 PM PGY-1, [place] Intern pager: [phone], text pages welcome  I have seen and examined the patient. I have read and agree with the above note. My changes are noted in blue.  Tawanna Sat, MD PGY-3, [place] Intern Pager: [phone], text pages welcome

## 2015-06-03 NOTE — ED Provider Notes (Signed)
CSN: [id number]     Arrival date & time 06/03/15  1321 History   First MD Initiated Contact with Patient 06/03/15 1329     Chief Complaint  Patient presents with  . Weakness     (Consider location/radiation/quality/duration/timing/severity/associated sxs/prior Treatment) HPI Comments: 74 year old female with past medical history including CVA, carotid endarterectomy, type 2 diabetes mellitus, memory loss presents with weakness and slurred speech. Patient reports that last night she was feeling fine when she went to bed. She states she had a dream that she was hit in her bladder and when she woke up she had to urinate several times this morning. She states she felt "lethargic" which she describes as generally weak. She reports having a dry mouth and having difficulty forming words when she was trying to call her daughter for help. She feels like her speech has improved but is not back to normal. She denies any unilateral weakness or numbness. No fevers or recent illness. No dysuria. No pain. No HA or visual changes.  Patient is a 74 y.o. female presenting with weakness. The history is provided by the patient.  Weakness    Past Medical History  Diagnosis Date  . Irregular heart beat   . Depression   . Stroke [facility]) 2006  . Cancer ([place])     skin  . Carotid stenosis     s/p R CEA 2006  . Cataract     s/p surgery 2007  . Memory loss    Past Surgical History  Procedure Laterality Date  . Coronary angioplasty    . Basal cell carcinoma excision  02/18/2003  . Carotid endarterectomy  05/04/2005    R stenosis >80% on 03/16/2005  . Glaucoma surgery  06/25/2005  . Cataract extraction     Family History  Problem Relation Age of Onset  . Cancer Mother     Breast  . Aneurysm Father   . Cancer Sister     lung cancer  . Cancer Daughter     colon cancer  . Diabetes Maternal Grandmother   . Cancer Maternal Grandfather     lung  . Cancer Paternal Grandmother     mouth    Social  History  Substance Use Topics  . Smoking status: Never Smoker   . Smokeless tobacco: Never Used  . Alcohol Use: No   OB History    Gravida Para Term Preterm AB TAB SAB Ectopic Multiple Living   7 6 6  1  1         Review of Systems  Neurological: Positive for weakness.   10 Systems reviewed and are negative for acute change except as noted in the HPI.    Allergies  Codeine; Neosporin; Petroleum jelly; Caine-1; Clindamycin/lincomycin; Isothiazolinone chloride; Neomycin sulfate; Phenylene; and Quaternium-15  Home Medications   Prior to Admission medications   Medication Sig Start Date End Date Taking? Authorizing Provider  aspirin 81 MG chewable tablet Chew 81 mg by mouth every morning.    Yes Historical Provider, MD  b complex vitamins tablet Take 1 tablet by mouth daily.   Yes Historical Provider, MD  bimatoprost (LUMIGAN) 0.03 % ophthalmic drops Place 1 drop into both eyes at bedtime.     Yes Historical Provider, MD  Coenzyme Q10 (CO Q-10) 100 MG CAPS Take by mouth.   Yes Historical Provider, MD  donepezil (ARICEPT) 5 MG tablet TAKE 1 TABLET (5 MG TOTAL) BY MOUTH AT BEDTIME. 03/17/15  Yes Pieter Partridge, DO  enalapril (VASOTEC) 2.5 MG tablet TAKE 1 TABLET BY MOUTH EVERY DAY 05/16/15  Yes Vivi Barrack, MD  Glucos-MSM-C-Mn-Ginger-Willow (MSM GLUCOSAMINE COMPLEX PO) Take by mouth.   Yes Historical Provider, MD  JANUVIA 100 MG tablet TAKE 1 TABLET BY MOUTH EVERY DAY 04/11/15  Yes Vivi Barrack, MD  lisdexamfetamine (VYVANSE) 30 MG capsule Take 1 capsule (30 mg total) by mouth daily. 04/13/15  Yes Kathlee Nations, MD  metFORMIN (GLUCOPHAGE) 850 MG tablet TAKE 1 TABLET (850 MG TOTAL) BY MOUTH 2 (TWO) TIMES DAILY WITH A MEAL. 08/19/14  Yes Vivi Barrack, MD  Multiple Vitamins-Minerals (MULTIVITAMIN WITH MINERALS) tablet Take 1 tablet by mouth every evening.    Yes Historical Provider, MD  Omega-3 Fatty Acids (FISH OIL) 1000 MG CAPS Take 1 capsule by mouth daily.   Yes Historical Provider,  MD  rOPINIRole (REQUIP) 1 MG tablet Take 3 tablets (3 mg total) by mouth at bedtime. 09/28/14  Yes Vivi Barrack, MD  sertraline (ZOLOFT) 100 MG tablet Take 1 tablet (100 mg total) by mouth daily. 04/13/15  Yes Kathlee Nations, MD  simvastatin (ZOCOR) 40 MG tablet Take 1 tablet (40 mg total) by mouth at bedtime. 08/19/14  Yes Vivi Barrack, MD  Incontinence Supplies MISC Use as directed 04/16/14   Vivi Barrack, MD  Incontinence Supply Disposable (REALITY INCONTINENT BRIEFS SM) MISC Use as needed. 04/12/14   Vivi Barrack, MD   BP 111/57 mmHg  Pulse 64  Temp(Src) 97.9 F (36.6 C) (Oral)  Resp 14  SpO2 92% Physical Exam  Constitutional: She is oriented to person, place, and time. She appears well-developed and well-nourished. No distress.  Awake, alert  HENT:  Head: Normocephalic and atraumatic.  Dry mucous membranes  Eyes: Conjunctivae and EOM are normal. Pupils are equal, round, and reactive to light.  Neck: Neck supple.  Cardiovascular: Normal rate, regular rhythm and normal heart sounds.   No murmur heard. Pulmonary/Chest: Effort normal and breath sounds normal. No respiratory distress.  Abdominal: Soft. Bowel sounds are normal. She exhibits no distension.  Musculoskeletal: She exhibits no edema.  Neurological: She is alert and oriented to person, place, and time. She has normal reflexes. No cranial nerve deficit. She exhibits normal muscle tone.  Very subtle slurred speech but able to speak in complete sentences, normal finger-to-nose testing, negative pronator drift; 5/5 strength and normal sensation throughout  Skin: Skin is warm and dry.  Psychiatric: She has a normal mood and affect. Judgment and thought content normal.  Nursing note and vitals reviewed.   ED Course  Procedures (including critical care time) Labs Review Labs Reviewed  BASIC METABOLIC PANEL - Abnormal; Notable for the following:    Chloride 112 (*)    Glucose, Bld 122 (*)    Creatinine, Ser 1.12 (*)     GFR calc non Af Amer 46 (*)    GFR calc Af Amer 54 (*)    All other components within normal limits  CBC WITH DIFFERENTIAL/PLATELET - Abnormal; Notable for the following:    RBC 3.75 (*)    Hemoglobin 10.8 (*)    HCT 33.3 (*)    All other components within normal limits  URINALYSIS, ROUTINE W REFLEX MICROSCOPIC (NOT AT [facility]) - Abnormal; Notable for the following:    Hgb urine dipstick TRACE (*)    All other components within normal limits  URINE MICROSCOPIC-ADD ON - Abnormal; Notable for the following:    Squamous Epithelial / LPF 0-5 (*)  Bacteria, UA RARE (*)    All other components within normal limits  CBG MONITORING, ED - Abnormal; Notable for the following:    Glucose-Capillary 139 (*)    All other components within normal limits    Imaging Review Ct Head Wo Contrast  06/03/2015  CLINICAL DATA:  Dizziness EXAM: CT HEAD WITHOUT CONTRAST TECHNIQUE: Contiguous axial images were obtained from the base of the skull through the vertex without intravenous contrast. COMPARISON:  10/26/2014 FINDINGS: Bony calvarium is intact. Mild atrophic changes are noted. Mild chronic white matter ischemic change is seen. No findings to suggest acute hemorrhage, acute infarct or space-occupying mass lesion are noted. IMPRESSION: Chronic atrophic and ischemic changes. No acute abnormality is noted. Electronically Signed   By: Inez Catalina M.D.   On: 06/03/2015 14:45   I have personally reviewed and evaluated these lab results as part of my medical decision-making.   EKG Interpretation   Date/Time:  Friday June 03 2015 13:35:06 EST Ventricular Rate:  74 PR Interval:  134 QRS Duration: 120 QT Interval:  441 QTC Calculation: 489 R Axis:   36 Text Interpretation:  Sinus rhythm Nonspecific intraventricular conduction  delay No significant change since last tracing Confirmed by LITTLE MD,  RACHEL [id number]) on 06/03/2015 4:09:33 PM      MDM   Final diagnoses:  None  slurred  speech Dysphagia H/o CVA  Pt presents from home via EMS for increased urination, dry mouth, and slurred speech that began this morning. On exam, patient was well-appearing with normal vital signs. Her speech was possibly very mildly slurred but she was able to answer questions appropriately speaking in full sentences. No obvious neurologic deficits on the rest of her exam. Normal BG. EKG unremarkable. Obtained above labs as well as head CT given h/o stroke.   Labwork unremarkable, no evidence of infection on UA. CT negative for acute process. I had the nurse perform a bedside swallow test which he patient failed. Daughter who is at bedside states that the patient's speech continues to be slightly abnormal from her baseline. Given these ongoing abnormalities, I consulted neurology and spoke with dr. Nicole Kindred, who will see patient in consultation. Discussed admission for TIA vs stroke workup with family medicine, Dr. Juanito Doom, and patient admitted to family medicine for further care.   Sharlett Iles, MD 06/03/15 [phone]

## 2015-06-03 NOTE — Consult Note (Signed)
Neurology Consultation Reason for Consult: Slurred speech Referring Physician: Hinton Rao  CC: Slurred speech  History is obtained from: Patient, family  HPI: Brittany Chung is a 74 y.o. female who was in her normal state of health on awakening at 8 AM this morning. She felt more tired than typical and therefore lay back down at 8:30. On awakening subsequently, she had to urinate family but will she try to get up she had significant difficulty. She states that she felt like she was still in a dream and then everything appeared like he was "underwater." She also had some mild delusions like someone was trying to kidnapper.  Her daughter who is with her stated that she was unable to make any intelligible speech, unable to express take any coherent thought. The patient states that she knew that she was speaking gibberish. This lasted for approximately 30 minutes and then improved.  Since that time she has had persistent dysarthria but otherwise has greatly improved.   LKW: 8:30 AM tpa given?: no, delay in arrival, mild symptoms    ROS: A 14 point ROS was performed and is negative except as noted in the HPI.   Past Medical History  Diagnosis Date  . Irregular heart beat   . Depression   . Stroke [facility]) 2006  . Cancer ([place])     skin  . Carotid stenosis     s/p R CEA 2006  . Cataract     s/p surgery 2007  . Memory loss      Family History  Problem Relation Age of Onset  . Cancer Mother     Breast  . Aneurysm Father   . Cancer Sister     lung cancer  . Cancer Daughter     colon cancer  . Diabetes Maternal Grandmother   . Cancer Maternal Grandfather     lung  . Cancer Paternal Grandmother     mouth      Social History:  reports that she has never smoked. She has never used smokeless tobacco. She reports that she does not drink alcohol or use illicit drugs.   Exam: Current vital signs: BP 116/44 mmHg  Pulse 67  Temp(Src) 98.3 F (36.8 C) (Oral)  Resp 20  Ht 5'  3.5" (1.613 m)  Wt 67.722 kg (149 lb 4.8 oz)  BMI 26.03 kg/m2  SpO2 96% Vital signs in last 24 hours: Temp:  [97.9 F (36.6 C)-98.3 F (36.8 C)] 98.3 F (36.8 C) (12/30 2010) Pulse Rate:  [64-82] 67 (12/30 2010) Resp:  [14-30] 20 (12/30 2010) BP: (99-154)/(44-68) 116/44 mmHg (12/30 2010) SpO2:  [92 %-100 %] 96 % (12/30 2010) Weight:  [67.722 kg (149 lb 4.8 oz)] 67.722 kg (149 lb 4.8 oz) (12/30 2010)   Physical Exam  Constitutional: Appears well-developed and well-nourished.  Psych: Affect appropriate to situation Eyes: No scleral injection HENT: No OP obstrucion Head: Normocephalic.  Cardiovascular: Normal rate and regular rhythm.  Respiratory: Effort normal and breath sounds normal to anterior ascultation GI: Soft.  No distension. There is no tenderness.  Skin: WDI  Neuro: Mental Status: Patient is awake, alert, oriented to person, place, month, year, and situation. Patient is able to give a clear and coherent history. No signs of aphasia or neglect She does have some dysarthria Cranial Nerves: II: Visual Fields are full. Pupils are equal, round, and reactive to light.   III,IV, VI: EOMI without ptosis or diploplia.  V: Facial sensation is symmetric to temperature VII: Facial  movement is notable for asymmetry of the nasolabial fold with less pronounced NL fold on the right VIII: hearing is intact to voice X: Uvula elevates symmetrically XI: Shoulder shrug is symmetric. XII: tongue is midline without atrophy or fasciculations.  Motor: Tone is normal. Bulk is normal. 5/5 strength was present in all four extremities.  Sensory: Sensation is symmetric to light touch and temperature in the arms and legs. Deep Tendon Reflexes: 2+ and symmetric in the biceps and patellae.  Plantars: Toes are downgoing bilaterally.  Cerebellar: FNF are intact bilaterally    I have reviewed labs in epic and the results pertinent to this consultation are: BMP-mildly elevated  creatinine  I have reviewed the images obtained: CT head-no acute abnormality  Impression: 74 year old female with transient severe symptoms subsequent improvement though left with persistent right facial droop and dysarthria. I suspect that she had a threatened fairly large stroke with improvement of most symptoms with the dysarthria/facial droop as persistent sequela. I think this is most likely, but however given the odd story at onset I would favor getting an EEG as well.  Recommendations: 1. HgbA1c, fasting lipid panel 2. MRI, MRA  of the brain without contrast 3. Frequent neuro checks 4. Echocardiogram 5. Carotid dopplers 6. Prophylactic therapy-Antiplatelet med: Aspirin - dose 325mg  PO or 300mg  PR 7. Risk factor modification 8. Telemetry monitoring 9. PT consult, OT consult, Speech consult 10. EEG   Roland Rack, MD [facility] [phone]  If 7pm- 7am, please page neurology on call as listed in [place].

## 2015-06-03 NOTE — Progress Notes (Signed)
Pt arrived to unit alert and oriented. Oriented to room. Questions answered. Call bell at side. Bed alarm activated. Will continue to monitor. Lya Holben M, RN 

## 2015-06-03 NOTE — ED Notes (Signed)
Admitting Doctors at the bedside

## 2015-06-03 NOTE — ED Notes (Signed)
Reported to Dr. Rex Kras that pt. Did not pass her Swallowing Evaluation

## 2015-06-03 NOTE — ED Notes (Signed)
Attempted to call report.  Room has not been assigned , will call back in a few minutes

## 2015-06-03 NOTE — ED Notes (Signed)
Pt. Went to bed last night and was feeling fine.  She reports waking up and having to urinate numerous times this am and she felt "Lethargic."   Paramedics reports that the pt. Was very sleepy and reported having a dry mouth.  She is alert and oriented X 4.  Skin is warm , pink and dry.  She denies any pain or discomfort  She denies any dizziness. GCS 15

## 2015-06-04 ENCOUNTER — Inpatient Hospital Stay (HOSPITAL_COMMUNITY): Payer: Commercial Managed Care - HMO

## 2015-06-04 DIAGNOSIS — Z8673 Personal history of transient ischemic attack (TIA), and cerebral infarction without residual deficits: Secondary | ICD-10-CM | POA: Diagnosis present

## 2015-06-04 DIAGNOSIS — R1310 Dysphagia, unspecified: Secondary | ICD-10-CM | POA: Diagnosis present

## 2015-06-04 DIAGNOSIS — I639 Cerebral infarction, unspecified: Secondary | ICD-10-CM

## 2015-06-04 DIAGNOSIS — R4781 Slurred speech: Secondary | ICD-10-CM

## 2015-06-04 DIAGNOSIS — F3162 Bipolar disorder, current episode mixed, moderate: Secondary | ICD-10-CM

## 2015-06-04 DIAGNOSIS — E785 Hyperlipidemia, unspecified: Secondary | ICD-10-CM | POA: Diagnosis present

## 2015-06-04 LAB — GLUCOSE, CAPILLARY
GLUCOSE-CAPILLARY: 144 mg/dL — AB (ref 65–99)
Glucose-Capillary: 124 mg/dL — ABNORMAL HIGH (ref 65–99)
Glucose-Capillary: 123 mg/dL — ABNORMAL HIGH (ref 65–99)
Glucose-Capillary: 112 mg/dL — ABNORMAL HIGH (ref 65–99)

## 2015-06-04 LAB — LIPID PANEL
Cholesterol: 136 mg/dL (ref 0–200)
HDL: 60 mg/dL (ref >40)
LDL CALC: 57 mg/dL (ref 0–99)
Total CHOL/HDL Ratio: 2.3 RATIO
Triglycerides: 94 mg/dL (ref <150)
VLDL: 19 mg/dL (ref 0–40)

## 2015-06-04 LAB — BASIC METABOLIC PANEL
Anion gap: 9 (ref 5–15)
BUN: 16 mg/dL (ref 6–20)
CHLORIDE: 110 mmol/L (ref 101–111)
CO2: 24 mmol/L (ref 22–32)
CREATININE: 1.27 mg/dL — AB (ref 0.44–1.00)
Calcium: 9.2 mg/dL (ref 8.9–10.3)
GFR calc non Af Amer: 40 mL/min — ABNORMAL LOW (ref >60)
GFR, EST AFRICAN AMERICAN: 46 mL/min — AB (ref >60)
GLUCOSE: 140 mg/dL — AB (ref 65–99)
Potassium: 4.2 mmol/L (ref 3.5–5.1)
Sodium: 143 mmol/L (ref 135–145)

## 2015-06-04 MED ORDER — ACETAMINOPHEN 325 MG PO TABS
650.0000 mg | ORAL_TABLET | Freq: Four times a day (QID) | ORAL | Status: DC | PRN
  Administered 2015-06-04: 650 mg via ORAL
  Filled 2015-06-04: qty 2

## 2015-06-04 MED ORDER — ASPIRIN 325 MG PO TABS
325.0000 mg | ORAL_TABLET | Freq: Every day | ORAL | Status: DC
  Administered 2015-06-04 – 2015-06-05 (×2): 325 mg via ORAL
  Filled 2015-06-04 (×2): qty 1

## 2015-06-04 MED ORDER — DOCUSATE SODIUM 100 MG PO CAPS
100.0000 mg | ORAL_CAPSULE | Freq: Two times a day (BID) | ORAL | Status: DC | PRN
  Administered 2015-06-04: 100 mg via ORAL
  Filled 2015-06-04: qty 1

## 2015-06-04 MED ORDER — GLUCERNA SHAKE PO LIQD
237.0000 mL | ORAL | Status: DC
  Administered 2015-06-04 – 2015-06-05 (×2): 237 mL via ORAL
  Filled 2015-06-04 (×3): qty 237

## 2015-06-04 NOTE — Progress Notes (Signed)
Family Medicine Teaching Service Daily Progress Note Intern Pager: [phone]  Patient name: Brittany Chung Medical record number: [id number] Date of birth: 1940-08-18 Age: 74 y.o. Gender: female  Primary Care Provider: Dimas Chyle, MD Consultants: Neurology Code Status: Full  Pt Overview and Major Events to Date:  12/30 - Admitted to [place] for stroke workup  Assessment and Plan: Brittany Chung is a 74 y.o. female presenting with slurred speech and weakness that started 06/03/15 . PMH is significant for CVA, carotid endarterectomy, type 2 diabetes mellitus, memory loss.  Slurred Speech/Weakness: Suspicious for TIA vs stroke. Patient is at risk due to hx of CVA in 2006 and previous carotid endarterectomy. Takes daily 81 mg ASA. Possible occlusion of extracranial or intracranial arteries. Neuro exam unremarkable except for minor slurred speech. CT head negative for acute process. EKG unremarkable for arrhythmia. Infectious causes unlikely; pt afebrile, WBC WNL, UA neg for nitrites or leukocytes. Currently hemodynamically stable.  - SLP eval and treat to determine when she can eat; in the meantime continue D5 1/2NS at 100 ml/hr.  - MRI/MRA brain wo contrast ordered - Carotid dopplers ordered  - Echocardiogram ordered - Monitor on telemetry - EEG ordered, per neuro, given odd presentation - Labs: Hgb A1C and Lipid Panel -Increase asa to 325 mg PO or 300 mg PR per neurology - Permissive hypertension; hold home enalapril  - Neurology consulted, appreciate assistance - PT/OT evaluations  Psych: Depression and ADHD: No depressive symptoms at this time.  - Continue home Zoloft 100 mg daily - Hold home Vyvance  Mild Cognitive Impairment: Follows with Dr. McDiarmid [facility] - Continue home Aricept 5 mg daily  Hyperlipidemia: Controlled. Last Lipid Panel on 09/28/14: Cholesterol 180, Triglycerides 140, HDL 67, LDL 85 - Lipid Panel ordered, showed cholesterol 136, HDL 60, LDL  57 - Continue home Simvastatin 40 mg daily  Diabetes: Relatively controlled. Last Hgb A1c on 09/28/14 was 7.1. Glucose slightly elevated today at 124. - Hgb A1C in process - Hold home Metformin and Januvia  - Obtain q ac and hs CBGs.  - Add SSI if needed.   FEN/GI: D5 1/2 NS at 100 mL/hr, NPO (failed swallow screen), transition to heart healthy diet once cleared by SLP Prophylaxis: Heparin  Disposition: Admitted to inpatient family medicine teaching service, discharge home pending workup  Subjective:  - Patient hoping to get a diet order so that she may resume medications and eat. - Believes speech has improved. - Denies any weakness.   Objective: Temp:  [97.7 F (36.5 C)-98.5 F (36.9 C)] 97.7 F (36.5 C) (12/31 0616) Pulse Rate:  [64-82] 66 (12/31 0616) Resp:  [14-30] 16 (12/31 0616) BP: (99-154)/(44-68) 148/53 mmHg (12/31 0616) SpO2:  [92 %-100 %] 98 % (12/31 0616) Weight:  [149 lb 4.8 oz (67.722 kg)] 149 lb 4.8 oz (67.722 kg) (12/30 2010) Physical Exam: General: Well nourished woman, sitting up in bed visiting with daughter at bedside HEENT: MMM. Few teeth.  Cardiovascular: RRR, no m/r/g Respiratory: CTAB, no increased WOB Abdomen: +BS, soft, NT, ND Extremities: Full strength and ROM Neuro: Speech still somewhat slurred. No facial droop. CNII-XII grossly intact. AOx4.   Laboratory:  Recent Labs Lab 06/03/15 1523  WBC 7.7  HGB 10.8*  HCT 33.3*  PLT 173    Recent Labs Lab 06/03/15 1523  NA 145  K 5.0  CL 112*  CO2 26  BUN 18  CREATININE 1.12*  CALCIUM 9.5  GLUCOSE 122*    Imaging/Diagnostic Tests: Ct Head  Wo Contrast  06/03/2015  CLINICAL DATA:  Dizziness EXAM: CT HEAD WITHOUT CONTRAST TECHNIQUE: Contiguous axial images were obtained from the base of the skull through the vertex without intravenous contrast. COMPARISON:  10/26/2014 FINDINGS: Bony calvarium is intact. Mild atrophic changes are noted. Mild chronic white matter ischemic change is seen.  No findings to suggest acute hemorrhage, acute infarct or space-occupying mass lesion are noted. IMPRESSION: Chronic atrophic and ischemic changes. No acute abnormality is noted. Electronically Signed   By: Inez Catalina M.D.   On: 06/03/2015 14:45    Rogue Bussing, MD 06/04/2015, 7:10 AM PGY-1, [place] Intern pager: [phone], text pages welcome

## 2015-06-04 NOTE — Progress Notes (Signed)
STROKE TEAM PROGRESS NOTE   HISTORY Brittany Chung is a 74 y.o. female who was in her normal state of health on awakening at 8 AM this morning. She felt more tired than typical and therefore lay back down at 8:30. On awakening subsequently, she had to urinate family but when she'd try to get up she had significant difficulty. She states that she felt like she was still in a dream and then everything appeared like he was "underwater." She also had some mild delusions like someone was trying to kidnapper.  Her daughter who is with her stated that she was unable to make any intelligible speech, unable to express take any coherent thought. The patient states that she knew that she was speaking gibberish. This lasted for approximately 30 minutes and then improved. Since that time she has had persistent dysarthria but otherwise has greatly improved.   LKW: 8:30 AM tpa given?: no, delay in arrival, mild symptoms   SUBJECTIVE (INTERVAL HISTORY) The patient's daughter was at the bedside. She reports that the patient has bipolar disorder and recently has been manic. She has not been taking her medications as prescribed. The patient admits to being depressed, stressed, and anxious. Recounted history of current event, more of acting out of dream, not consistent with stroke event.   OBJECTIVE Temp:  [97.7 F (36.5 C)-98.5 F (36.9 C)] 98.4 F (36.9 C) (12/31 1332) Pulse Rate:  [65-98] 98 (12/31 1332) Cardiac Rhythm:  [-] Normal sinus rhythm (12/31 0700) Resp:  [15-24] 16 (12/31 1332) BP: (104-154)/(44-62) 118/45 mmHg (12/31 1332) SpO2:  [96 %-100 %] 100 % (12/31 1332) Weight:  [67.722 kg (149 lb 4.8 oz)] 67.722 kg (149 lb 4.8 oz) (12/30 2010)  CBC:   Recent Labs Lab 06/03/15 1523  WBC 7.7  NEUTROABS 2.8  HGB 10.8*  HCT 33.3*  MCV 88.8  PLT [id number]    Basic Metabolic Panel:   Recent Labs Lab 06/03/15 1523 06/04/15 1138  NA 145 143  K 5.0 4.2  CL 112* 110  CO2 26 24  GLUCOSE 122*  140*  BUN 18 16  CREATININE 1.12* 1.27*  CALCIUM 9.5 9.2    Lipid Panel:     Component Value Date/Time   CHOL 136 06/04/2015 0339   TRIG 94 06/04/2015 0339   HDL 60 06/04/2015 0339   CHOLHDL 2.3 06/04/2015 0339   VLDL 19 06/04/2015 0339   LDLCALC 57 06/04/2015 0339   HgbA1c:  Lab Results  Component Value Date   HGBA1C 7.1 09/28/2014   Urine Drug Screen: No results found for: LABOPIA, COCAINSCRNUR, LABBENZ, AMPHETMU, THCU, LABBARB    IMAGING I have personally reviewed the radiological images below and agree with the radiology interpretations. Blue text is my interpretation.  Ct Head Wo Contrast 06/03/2015   Chronic atrophic and ischemic changes. No acute abnormality is noted.   MRI / MRA Brain without contrast 06/04/2015 Motion degraded examination demonstrating possible 2-3 mm area of restricted diffusion in the posterior limb internal capsule. This must be considered equivocal/even artifactual, given the lack of correlative restriction on coronal DWI. Consider follow-up in 1 week. Advanced atrophy and small vessel disease. By my read, the DWI change reported above is more consistent with artifact after correlation with pt history.  CUS - Bilateral: 1-39% ICA stenosis. Vertebral artery flow is antegrade.  TTE - pending   PHYSICAL EXAM  Temp:  [97.7 F (36.5 C)-98.5 F (36.9 C)] 98.3 F (36.8 C) (12/31 1832) Pulse Rate:  [65-98] 78 (12/31  1832) Resp:  [16-20] 16 (12/31 1832) BP: (116-148)/(44-61) 144/50 mmHg (12/31 1832) SpO2:  [96 %-100 %] 99 % (12/31 1832) Weight:  [149 lb 4.8 oz (67.722 kg)] 149 lb 4.8 oz (67.722 kg) (12/30 2010)  General - Well nourished, well developed, in no apparent distress.  Ophthalmologic - Sharp disc margins OU.   Cardiovascular - Regular rate and rhythm with no murmur.  Mental Status -  Level of arousal and orientation to time, place, and person were intact. Language including expression, naming, repetition, comprehension was  assessed and found intact. Fund of Knowledge was assessed and was intact.  Cranial Nerves II - XII - II - Visual field intact OU. III, IV, VI - Extraocular movements intact. V - Facial sensation intact bilaterally. VII - Facial movement intact bilaterally. VIII - Hearing & vestibular intact bilaterally. X - Palate elevates symmetrically. XI - Chin turning & shoulder shrug intact bilaterally. XII - Tongue protrusion intact.  Motor Strength - The patient's strength was normal in all extremities and pronator drift was absent.  Bulk was normal and fasciculations were absent.   Motor Tone - Muscle tone was assessed at the neck and appendages and was normal.  Reflexes - The patient's reflexes were 1+ in all extremities and she had no pathological reflexes.  Sensory - Light touch, temperature/pinprick were assessed and were symmetrical.    Coordination - The patient had normal movements in the hands and feet with no ataxia or dysmetria.  Tremor was absent.  Gait and Station - deferred due to fatigue   ASSESSMENT/PLAN Ms. Brittany Chung is a 74 y.o. female with history of bipolar disorder, previous stroke, cancer history, early dementia, right carotid endarterectomy in 2006, and history of irregular heart rate  presenting with episode of confusion and acting out of dreams.    Bipolar disorder with alternating depression and mania and acting out of dream  Resultant  No neuro deficit  MRI - no acute stroke, however, there are chronic infarcts right BG and left CR  MRA - unremarkable   Carotid Doppler - unremarkable  2D Echo - pending  LDL - 57  HgbA1c pending  VTE prophylaxis - subcutaneous heparin  DIET DYS 2 Room service appropriate?: Yes; Fluid consistency:: Thin  aspirin 81 mg daily prior to admission, now on aspirin 325 mg daily. Continue ASA on discharge.  Patient counseled to be compliant with her antithrombotic medications  Ongoing aggressive stroke risk factor  management  Therapy recommendations: No follow-up physical therapy recommended   Disposition: Pending  Hypertension  Blood pressure mildly low at times.  Permissive hypertension (OK if < 220/120) but gradually normalize in 5-7 days  Hyperlipidemia  Home meds:   Zocor 40 mg daily resumed in hospital  LDL 57, goal < 70  Continue statin at discharge  Diabetes  HgbA1c pending, goal < 7.0  Controlled  SSI  Other Stroke Risk Factors  Advanced age  Hx stroke/TIA  Other Active Problems  Bipolar disorder with medical noncompliance  Anemia  Mildly elevated creatinine  Memory loss on Aricept  Hospital day # 1  Neurology will sign off. Please call with questions. No neuro follow up needed at this time. Thanks for the consult.  Rosalin Hawking, MD PhD Stroke Neurology 06/04/2015 7:25 PM     To contact Stroke Continuity provider, please refer to [email or web address] After hours, contact General Neurology

## 2015-06-04 NOTE — Progress Notes (Signed)
Initial Nutrition Assessment  DOCUMENTATION CODES:  Not applicable  INTERVENTION:  Glucerna Shake q24 hrs, each supplement provides 220 kcal and 10 grams of protein  Attempted to educate on DM diet, but pt was not interested.   NUTRITION DIAGNOSIS:  Biting/chewing difficulty related to Loss of teeth/non fitting adaptive devices as evidenced by per patient/family report.  GOAL:  Patient will meet greater than or equal to 90% of their needs  MONITOR:  PO intake, Supplement acceptance, I & O's, Labs  REASON FOR ASSESSMENT:  Malnutrition Screening Tool    ASSESSMENT:  74 y.o. female PMHx CVA, memory loss, skin cancer, DM2, HLD, IBS, HTN who presents with with slurred speech and weakness that started earlier yesterday.  Patient is MRI on RD arrival. Both pt's daughter were present. They report that one of patients major issues with her nutritional status is her minimal teeth and poor fitting adaptive mouth pieces. They acknowledge that patient does not eat well because of this. She "does the small meal thing". She will eat 5-6 smaller meals as she cannot tolerate large meals. Apparently the daughters are working on getting her better fitting dentures.   Pt's IBS complicates things. Per daughter report. Pt tries to eat a lot of fiber, but due to her troubles chewing, these foods are difficult for to manage.   Per EMR documentation, pt does have a slight wt loss over the past few years. She used to weigh >170 lbs. Daughters report that patient was instructed to lose weight by her endocrinologist to better manage her DM.    --Returned later and spoke with patient. She states that anything that gets under her dentures cause pain so she has to watch very closely what she eats. She had trouble with the breakfast this morning, but ate lunch very well.   RD asked more about the patients home eating habits. She reports that she doesn't follow a DM diet, but she knows what it is (daughter behind  her shook her head "no" to this statement"). RD asked if she would like any refreshers and pt declined. RD asked about specific high glycemic foods such as potatoes, grains, rice etc. Pt states she doesn't eat these (again daughter behind shakes head indicating this isnt true). RD sensed patient getting slightly frustrated/defensive and did not further attempt any DM education.   Was agreeable to glucerna.   NFPE: WDL.  Diet Order:  DIET DYS 2 Room service appropriate?: Yes; Fluid consistency:: Thin  Skin:  Reviewed, no issues  Last BM:  Unknown  Height:  Ht Readings from Last 1 Encounters:  06/03/15 5' 3.5" (1.613 m)   Weight:  Wt Readings from Last 1 Encounters:  06/03/15 149 lb 4.8 oz (67.722 kg)   Wt Readings from Last 10 Encounters:  06/03/15 149 lb 4.8 oz (67.722 kg)  04/15/15 144 lb 11.2 oz (65.635 kg)  04/13/15 147 lb (66.679 kg)  04/11/15 145 lb 8 oz (65.998 kg)  01/11/15 144 lb 6.4 oz (65.499 kg)  11/30/14 145 lb (65.772 kg)  11/22/14 143 lb (64.864 kg)  11/11/14 146 lb 3.2 oz (66.316 kg)  10/13/14 150 lb 11.2 oz (68.357 kg)  10/08/14 151 lb 6.4 oz (68.675 kg)   Ideal Body Weight:  53.4 kg  BMI:  Body mass index is 26.03 kg/(m^2).  Estimated Nutritional Needs:  Kcal:  1500-1700 (22-25 kcal/kg bw) Protein:  50-60 g Pro  Fluid:  1.5-1.7 liters fluid  EDUCATION NEEDS:  No education needs identified at this time  Burtis Junes RD, LDN Nutrition Pager: [phone] 06/04/2015 10:26 AM

## 2015-06-04 NOTE — Progress Notes (Signed)
*  PRELIMINARY RESULTS* Vascular Ultrasound Carotid Duplex (Doppler) has been completed.   Findings suggest 1-39% right internal carotid artery stenosis and upper range 1-39% left internal carotid artery stenosis. Vertebral arteries are patent with antegrade flow.  06/04/2015 11:30 AM Maudry Mayhew, RVT, RDCS, RDMS

## 2015-06-04 NOTE — Evaluation (Signed)
Clinical/Bedside Swallow Evaluation Patient Details  Name: Brittany Chung MRN: [id number] Date of Birth: 06-16-1940  Today's Date: 06/04/2015 Time: SLP Start Time (ACUTE ONLY): 0825 SLP Stop Time (ACUTE ONLY): 0841 SLP Time Calculation (min) (ACUTE ONLY): 16 min  Past Medical History:  Past Medical History  Diagnosis Date  . Irregular heart beat   . Depression   . Stroke [facility]) 2006  . Cancer ([place])     skin  . Carotid stenosis     s/p R CEA 2006  . Cataract     s/p surgery 2007  . Memory loss    Past Surgical History:  Past Surgical History  Procedure Laterality Date  . Coronary angioplasty    . Basal cell carcinoma excision  02/18/2003  . Carotid endarterectomy  05/04/2005    R stenosis >80% on 03/16/2005  . Glaucoma surgery  06/25/2005  . Cataract extraction     HPI:  Brittany Chung is a 74 y.o. female presenting with slurred speech and weakness that started earlier today . PMH is significant for CVA, carotid endarterectomy, type 2 diabetes mellitus, memory loss. CT of head negative for acute abnormalities. MRI of brain to be completed.    Assessment / Plan / Recommendation Clinical Impression  Pt presenting with mild oral dysphagia secondary to primarily edentulous lower oral cavity affecting functional mastication of solid PO. At baseline pt with use of partials, which she states are currently at home. Noted prolonged mastication and delayed AP transit of solids. Pt appeared to exhibit adequate protection of airway without overt signs or symptoms of aspiration with any PO. Recommend dysphagia 2 (chopped) and thin liquid diet. Medicines whole in puree. ST to follow up upgraded PO trials and SLE to be completed.      Aspiration Risk  Mild aspiration risk    Diet Recommendation     Medication Administration: Whole meds with puree    Other  Recommendations Oral Care Recommendations: Oral care BID   Follow up Recommendations       Frequency and Duration min  2x/week  1 week       Prognosis Prognosis for Safe Diet Advancement: Good      Swallow Study   General Date of Onset: 06/03/15 HPI: Brittany Chung is a 74 y.o. female presenting with slurred speech and weakness that started earlier today . PMH is significant for CVA, carotid endarterectomy, type 2 diabetes mellitus, memory loss. CT of head negative for acute abnormalities. MRI of brain to be completed.  Type of Study: Bedside Swallow Evaluation Previous Swallow Assessment: no speech intervention on file  Diet Prior to this Study: NPO Temperature Spikes Noted: No Respiratory Status: Room air History of Recent Intubation: No Behavior/Cognition: Cooperative;Alert;Pleasant mood Oral Cavity Assessment: Within Functional Limits Oral Cavity - Dentition: Missing dentition (upper dentition present, minimal lower dentition ) Vision: Functional for self-feeding Self-Feeding Abilities: Able to feed self Patient Positioning: Upright in bed Baseline Vocal Quality: Normal Volitional Cough: Strong Volitional Swallow: Able to elicit    Oral/Motor/Sensory Function Overall Oral Motor/Sensory Function: Within functional limits   Ice Chips Ice chips: Not tested   Thin Liquid Thin Liquid: Within functional limits Presentation: Cup;Straw    Nectar Thick Nectar Thick Liquid: Not tested   Honey Thick Honey Thick Liquid: Not tested   Puree Puree: Within functional limits   Solid   GO    Solid: Impaired Presentation: Self Fed Oral Phase Impairments: Impaired mastication Oral Phase Functional Implications: Prolonged oral transit  Arvil Chaco MA, [facility] Speech Language Pathologist    Arvil Chaco E 06/04/2015,8:48 AM

## 2015-06-04 NOTE — Discharge Summary (Signed)
[facility] Discharge Summary  Patient name: Brittany Chung record number: [id number] Date of birth: March 05, 1941 Age: 74 y.o. Gender: female Date of Admission: 06/03/2015  Date of Discharge: 06/05/2015 Admitting Physician: Alveda Reasons, MD  Primary Care Provider: Dimas Chyle, MD Consultants: Neurology  Indication for Hospitalization: dysarthria  Discharge Diagnoses/Problem List:  Active Problems:   Slurred speech   TIA (transient ischemic attack)   Dysphagia   Bipolar disorder   HLD (hyperlipidemia)   History of stroke   Diastolic HF (heart failure) ([place])  Disposition: Home  Discharge Condition: Improved  Discharge Exam:  Blood pressure 128/53, pulse 66, temperature 98.4 F (36.9 C), temperature source Oral, resp. rate 18, height 5' 3.5" (1.613 m), weight 149 lb 4.8 oz (67.722 kg), SpO2 96 %. General: Well nourished woman, sitting up in bed  HEENT: MMM. Few teeth.  Cardiovascular: RRR, no m/r/g Respiratory: Mild bibasilar crackles, no increased WOB Abdomen: +BS, soft, NT, ND Extremities: Full strength and ROM Neuro: Speech clear and understandable with minimal slurring (few teeth), improved from yesterday. No facial droop. CNII-XII grossly intact. AOx4.   Brief Hospital Course:  Brittany Chung is a 74 y.o. female who presented with slurred speech and weakness that started the day of admission. Presentation was unusual, as patient described feeling like she had been acting out a dream and felt like her heart was racing. Symptoms were transient and occurred at end of sleep. PMH is significant for CVA, carotid endarterectomy, type 2 diabetes mellitus, bipolar disorder with recent episode of mania, and memory loss.  Patient was admitted for concern of TIA vs. stroke to complete work-up, and neurology was consulted. Speech was improving at time of admission; neuro exam was unremarkable except for mild dysarthria. CT head negative for  acute process but with chronic atrophic and ischemic changes. EKG unremarkable for arrhythmia. Infectious causes unlikely; pt afebrile, WBC WNL, UA neg for nitrites or leukocytes. MRI/MRA brain showed an area of restricted diffusion in the posterior limb of the internal capsule, believed to be artifact by Neurology. Carotid dopplers showed 1-39% ICA stenosis bilaterally. ECHO showed LVEF of 60-65% and grade 2 diastolic dysfunction, as well as mild regurgitation of aortic, mitral, tricuspid and pulmonic valves. Lipid panel showed cholesterol 136, HDL 60, LDL 57. Neurology recommending increasing aspirin to 325 mg PO and signed off 12/31 not recommending further neurology follow-up, as event did not appear consistent with stroke.  Hospitalization was complicated by AKI, with increased SCr at 1.34 (baseline ~ 0.9). Likely pre-renal due to decreased PO on dysphagia 2 diet. Dysarthria had resolved by day of discharge, and SLP recommended regular diet. She received a flu shot prior to discharge.   Issues for Follow Up:  1. BMP to check SCr 2. Confirm close follow-up with outpatient psychiatrist  Significant Procedures: ECHO, carotid dopplers  Significant Labs and Imaging:   Recent Labs Lab 06/03/15 1523 06/05/15 0052  WBC 7.7 9.4  HGB 10.8* 11.5*  HCT 33.3* 35.3*  PLT 173 215    Recent Labs Lab 06/03/15 1523 06/04/15 1138 06/05/15 0052  NA 145 143 141  K 5.0 4.2 4.0  CL 112* 110 104  CO2 26 24 27   GLUCOSE 122* 140* 122*  BUN 18 16 19   CREATININE 1.12* 1.27* 1.34*  CALCIUM 9.5 9.2 9.6    Ct Head Wo Contrast  06/03/2015  CLINICAL DATA:  Dizziness EXAM: CT HEAD WITHOUT CONTRAST TECHNIQUE: Contiguous axial images were obtained from the base of the  skull through the vertex without intravenous contrast. COMPARISON:  10/26/2014 FINDINGS: Bony calvarium is intact. Mild atrophic changes are noted. Mild chronic white matter ischemic change is seen. No findings to suggest acute hemorrhage,  acute infarct or space-occupying mass lesion are noted. IMPRESSION: Chronic atrophic and ischemic changes. No acute abnormality is noted. Electronically Signed   By: Inez Catalina M.D.   On: 06/03/2015 14:45   Mr Brain Wo Contrast  06/04/2015  CLINICAL DATA:  74 year old female with transient severe symptoms subsequent improvement though left with persistent right facial droop and dysarthria. Suspected threatened fairly large stroke with improvement of most symptoms with the dysarthria/facial droop as persistent sequela. Initial encounter. EXAM: MRI HEAD WITHOUT CONTRAST MRA HEAD WITHOUT CONTRAST TECHNIQUE: Multiplanar, multiecho pulse sequences of the brain and surrounding structures were obtained without intravenous contrast. Angiographic images of the head were obtained using MRA technique without contrast. COMPARISON:  CT head 06/03/2015.  MR brain 10/26/2014. FINDINGS: MRI HEAD FINDINGS The patient was unable to remain motionless for the exam. Small or subtle lesions could be overlooked. There is a very subtle asymmetry on diffusion-weighted imaging affecting the posterior most aspect of the posterior internal capsule, 2-3 mm diameter, seen only on image 22 series 3. This cannot be confirmed on coronal DWI nor on T2 or FLAIR sequences. It must be considered as equivocal/questionable for acute ischemia. Followup scan of 1 week could be helpful in further evaluation. Generalized atrophy. Moderate to severe chronic microvascular ischemic change throughout the white matter. Scattered areas of chronic lacunar infarction. Flow voids are maintained throughout the carotid, basilar, and vertebral arteries. There are no areas of chronic hemorrhage. Partial empty sella. Pineal and cerebellar tonsils unremarkable. No upper cervical lesions. BILATERAL cataract extraction. No sinus or mastoid disease of significance. Compared with prior MR from 2016, similar degree of atrophy and small vessel disease. No abnormalities  were seen in the LEFT posterior limb internal capsule. MRA HEAD FINDINGS The LEFT internal carotid artery appears widely patent. The RIGHT internal carotid artery, slightly degraded by motion and tortuosity, particularly the supraclinoid segment, is felt to be widely patent as well. The LEFT ICA is slightly larger due to a robust asymmetric contribution of both anterior cerebral arteries from the LEFT. RIGHT A1 ACA is atretic/hypoplastic. No proximal RIGHT or LEFT MCA disease.  No MCA branch occlusion. Basilar artery widely patent with LEFT vertebral dominant. No PCA disease. No cerebellar branch occlusion. No intracranial aneurysm. IMPRESSION: Motion degraded examination demonstrating possible 2-3 mm area of restricted diffusion in the posterior limb internal capsule. This must be considered equivocal/even artifactual, given the lack of correlative restriction on coronal DWI. Consider follow-up in 1 week. Advanced atrophy and small vessel disease. No visible extracranial or intracranial stenosis of significance. Electronically Signed   By: Staci Righter M.D.   On: 06/04/2015 10:56   Mr Mra Head/brain Wo Cm  06/04/2015  CLINICAL DATA:  74 year old female with transient severe symptoms subsequent improvement though left with persistent right facial droop and dysarthria. Suspected threatened fairly large stroke with improvement of most symptoms with the dysarthria/facial droop as persistent sequela. Initial encounter. EXAM: MRI HEAD WITHOUT CONTRAST MRA HEAD WITHOUT CONTRAST TECHNIQUE: Multiplanar, multiecho pulse sequences of the brain and surrounding structures were obtained without intravenous contrast. Angiographic images of the head were obtained using MRA technique without contrast. COMPARISON:  CT head 06/03/2015.  MR brain 10/26/2014. FINDINGS: MRI HEAD FINDINGS The patient was unable to remain motionless for the exam. Small or subtle lesions could be  overlooked. There is a very subtle asymmetry on  diffusion-weighted imaging affecting the posterior most aspect of the posterior internal capsule, 2-3 mm diameter, seen only on image 22 series 3. This cannot be confirmed on coronal DWI nor on T2 or FLAIR sequences. It must be considered as equivocal/questionable for acute ischemia. Followup scan of 1 week could be helpful in further evaluation. Generalized atrophy. Moderate to severe chronic microvascular ischemic change throughout the white matter. Scattered areas of chronic lacunar infarction. Flow voids are maintained throughout the carotid, basilar, and vertebral arteries. There are no areas of chronic hemorrhage. Partial empty sella. Pineal and cerebellar tonsils unremarkable. No upper cervical lesions. BILATERAL cataract extraction. No sinus or mastoid disease of significance. Compared with prior MR from 2016, similar degree of atrophy and small vessel disease. No abnormalities were seen in the LEFT posterior limb internal capsule. MRA HEAD FINDINGS The LEFT internal carotid artery appears widely patent. The RIGHT internal carotid artery, slightly degraded by motion and tortuosity, particularly the supraclinoid segment, is felt to be widely patent as well. The LEFT ICA is slightly larger due to a robust asymmetric contribution of both anterior cerebral arteries from the LEFT. RIGHT A1 ACA is atretic/hypoplastic. No proximal RIGHT or LEFT MCA disease.  No MCA branch occlusion. Basilar artery widely patent with LEFT vertebral dominant. No PCA disease. No cerebellar branch occlusion. No intracranial aneurysm. IMPRESSION: Motion degraded examination demonstrating possible 2-3 mm area of restricted diffusion in the posterior limb internal capsule. This must be considered equivocal/even artifactual, given the lack of correlative restriction on coronal DWI. Consider follow-up in 1 week. Advanced atrophy and small vessel disease. No visible extracranial or intracranial stenosis of significance. Electronically Signed    By: Staci Righter M.D.   On: 06/04/2015 10:56   Results/Tests Pending at Time of Discharge: Hgb A1c  Discharge Medications:    Medication List    STOP taking these medications        aspirin 81 MG chewable tablet  Replaced by:  aspirin 325 MG tablet      TAKE these medications        aspirin 325 MG tablet  Take 1 tablet (325 mg total) by mouth daily.     b complex vitamins tablet  Take 1 tablet by mouth daily.     bimatoprost 0.03 % ophthalmic solution  Commonly known as:  LUMIGAN  Place 1 drop into both eyes at bedtime.     Co Q-10 100 MG Caps  Take by mouth.     donepezil 5 MG tablet  Commonly known as:  ARICEPT  TAKE 1 TABLET (5 MG TOTAL) BY MOUTH AT BEDTIME.     enalapril 2.5 MG tablet  Commonly known as:  VASOTEC  TAKE 1 TABLET BY MOUTH EVERY DAY     Fish Oil 1000 MG Caps  Take 1 capsule by mouth daily.     Incontinence Supplies Misc  Use as directed     JANUVIA 100 MG tablet  Generic drug:  sitaGLIPtin  TAKE 1 TABLET BY MOUTH EVERY DAY     lisdexamfetamine 30 MG capsule  Commonly known as:  VYVANSE  Take 1 capsule (30 mg total) by mouth daily.     metFORMIN 850 MG tablet  Commonly known as:  GLUCOPHAGE  TAKE 1 TABLET (850 MG TOTAL) BY MOUTH 2 (TWO) TIMES DAILY WITH A MEAL.     MSM GLUCOSAMINE COMPLEX PO  Take by mouth.     multivitamin with minerals tablet  Take 1 tablet by mouth every evening.     Reality Incontinent Briefs SM Misc  Use as needed.     rOPINIRole 1 MG tablet  Commonly known as:  REQUIP  Take 3 tablets (3 mg total) by mouth at bedtime.     sertraline 100 MG tablet  Commonly known as:  ZOLOFT  Take 1 tablet (100 mg total) by mouth daily.     simvastatin 40 MG tablet  Commonly known as:  ZOCOR  Take 1 tablet (40 mg total) by mouth at bedtime.        Discharge Instructions: Please refer to Patient Instructions section of EMR for full details.  Patient was counseled important signs and symptoms that should prompt  return to medical care, changes in medications, dietary instructions, activity restrictions, and follow up appointments.   Follow-Up Appointments: Follow-up Information    Follow up with Darci Needle, MD. Go on 06/09/2015.   Specialty:  Family Medicine   Why:  11:00 am appointment for hospital follow-up   Contact information:   [facility]  [id number] [phone]       Schedule an appointment as soon as possible for a visit with Nicholaus Bloom, MD.   Specialty:  Psychiatry   Why:  with outpatient psychiatrist   Contact information:   [address] [place] [address] [phone]       Rogue Bussing, MD 06/06/2015, 2:19 PM PGY-1, [place]

## 2015-06-04 NOTE — Progress Notes (Signed)
Occupational Therapy Discharge Patient Details Name: SIGNE JAVORSKY MRN: [id number] DOB: 12-21-1940 Today's Date: 06/04/2015 Time:  -     Patient discharged from OT services secondary to screen by PT Jocelyn Lamer with no acute needs for OT at this time..  Please see latest therapy progress note for current level of functioning and progress toward goals.    Progress and discharge plan discussed with patient and/or caregiver: Patient/Caregiver agrees with plan  GO     Vonita Moss   OTR/L Pager: [phone] Office: [phone] .  06/04/2015, 1:50 PM

## 2015-06-04 NOTE — Evaluation (Signed)
Physical Therapy Evaluation Patient Details Name: Brittany Chung MRN: [id number] DOB: [birth year] Today's Date: 06/04/2015   History of Present Illness  Brittany Chung is a 74 y.o. female who was in her normal state of health on awakening but later tried to get up she had significant difficulty as well as slurred speech. CT head neg  Clinical Impression  Patient evaluated by Physical Therapy with no further acute PT needs identified. All education has been completed and the patient has no further questions. Pt independent with transfers and gait, including stairs. She does admit to mild dizziness with quick head turns which is new for her, but not affecting mobility currently.  See below for any follow-up Physical Therapy or equipment needs. PT is signing off. Thank you for this referral.     Follow Up Recommendations No PT follow up    Equipment Recommendations  None recommended by PT    Recommendations for Other Services       Precautions / Restrictions Precautions Precautions: None Restrictions Weight Bearing Restrictions: No      Mobility  Bed Mobility Overal bed mobility: Independent                Transfers Overall transfer level: Independent Equipment used: None             General transfer comment: independent with transfers from multiple surfaces  Ambulation/Gait Ambulation/Gait assistance: Independent Ambulation Distance (Feet): 350 Feet Assistive device: None Gait Pattern/deviations: WFL(Within Functional Limits) Gait velocity: WFL Gait velocity interpretation: at or above normal speed for age/gender General Gait Details: pt able to change gait speeds, no abrnormalities noted. Pt still reports though that her head feels "heavy"  Stairs Stairs: Yes Stairs assistance: Modified independent (Device/Increase time) Stair Management: One rail Right;Alternating pattern;Forwards Number of Stairs: 4 General stair comments: no problems of strength  deficits noted on stairs  Wheelchair Mobility    Modified Rankin (Stroke Patients Only) Modified Rankin (Stroke Patients Only) Pre-Morbid Rankin Score: No significant disability Modified Rankin: No significant disability     Balance Overall balance assessment: Modified Independent                                           Pertinent Vitals/Pain Pain Assessment: No/denies pain  VSS    Home Living Family/patient expects to be discharged to:: Private residence Living Arrangements: Children Available Help at Discharge: Family;Available 24 hours/day Type of Home: House Home Access: Stairs to enter   [employer] of Steps: 3 Home Layout: One level Home Equipment: None Additional Comments: pt drives, helps take care of grandkids. Has felt fatigued over past several weeks.     Prior Function Level of Independence: Independent               Hand Dominance   Dominant Hand: Right    Extremity/Trunk Assessment   Upper Extremity Assessment: Overall WFL for tasks assessed           Lower Extremity Assessment: Overall WFL for tasks assessed      Cervical / Trunk Assessment: Normal  Communication   Communication: Expressive difficulties (mild slurring)  Cognition Arousal/Alertness: Awake/alert Behavior During Therapy: WFL for tasks assessed/performed Overall Cognitive Status: Within Functional Limits for tasks assessed                      General Comments General comments (skin integrity,  edema, etc.): pt reports mild dizziness if she turns her head quickly and that this is new for her. Did not create dizziness on eval with turns    Exercises        Assessment/Plan    PT Assessment Patent does not need any further PT services  PT Diagnosis Abnormality of gait   PT Problem List    PT Treatment Interventions     PT Goals (Current goals can be found in the Care Plan section) Acute Rehab PT Goals Patient Stated Goal:  return home and feel better PT Goal Formulation: All assessment and education complete, DC therapy    Frequency     Barriers to discharge        Co-evaluation               End of Session Equipment Utilized During Treatment: Gait belt Activity Tolerance: Patient tolerated treatment well Patient left: in bed;with call bell/phone within reach;with family/visitor present Nurse Communication: Mobility status         Time: 0731-0755 PT Time Calculation (min) (ACUTE ONLY): 24 min   Charges:   PT Evaluation $Initial PT Evaluation Tier I: 1 Procedure PT Treatments $Gait Training: 8-22 mins   PT G Codes:      Leighton Roach, PT  Acute Rehab Services  [place], [place] 06/04/2015, 9:19 AM

## 2015-06-05 ENCOUNTER — Inpatient Hospital Stay (HOSPITAL_COMMUNITY): Payer: Commercial Managed Care - HMO

## 2015-06-05 DIAGNOSIS — G459 Transient cerebral ischemic attack, unspecified: Secondary | ICD-10-CM | POA: Diagnosis not present

## 2015-06-05 DIAGNOSIS — I5030 Unspecified diastolic (congestive) heart failure: Secondary | ICD-10-CM | POA: Diagnosis present

## 2015-06-05 LAB — CBC
HEMATOCRIT: 35.3 % — AB (ref 36.0–46.0)
HEMOGLOBIN: 11.5 g/dL — AB (ref 12.0–15.0)
MCH: 28.4 pg (ref 26.0–34.0)
MCHC: 32.6 g/dL (ref 30.0–36.0)
MCV: 87.2 fL (ref 78.0–100.0)
Platelets: 215 10*3/uL (ref 150–400)
RBC: 4.05 MIL/uL (ref 3.87–5.11)
RDW: 13.4 % (ref 11.5–15.5)
WBC: 9.4 10*3/uL (ref 4.0–10.5)

## 2015-06-05 LAB — BASIC METABOLIC PANEL
Anion gap: 10 (ref 5–15)
BUN: 19 mg/dL (ref 6–20)
CHLORIDE: 104 mmol/L (ref 101–111)
CO2: 27 mmol/L (ref 22–32)
Calcium: 9.6 mg/dL (ref 8.9–10.3)
Creatinine, Ser: 1.34 mg/dL — ABNORMAL HIGH (ref 0.44–1.00)
GFR calc non Af Amer: 37 mL/min — ABNORMAL LOW (ref >60)
GFR, EST AFRICAN AMERICAN: 43 mL/min — AB (ref >60)
Glucose, Bld: 122 mg/dL — ABNORMAL HIGH (ref 65–99)
POTASSIUM: 4 mmol/L (ref 3.5–5.1)
SODIUM: 141 mmol/L (ref 135–145)

## 2015-06-05 LAB — GLUCOSE, CAPILLARY
GLUCOSE-CAPILLARY: 144 mg/dL — AB (ref 65–99)
GLUCOSE-CAPILLARY: 107 mg/dL — AB (ref 65–99)

## 2015-06-05 MED ORDER — INFLUENZA VAC SPLIT QUAD 0.5 ML IM SUSY
0.5000 mL | PREFILLED_SYRINGE | INTRAMUSCULAR | Status: AC | PRN
  Administered 2015-06-05: 0.5 mL via INTRAMUSCULAR
  Filled 2015-06-05: qty 0.5

## 2015-06-05 MED ORDER — ASPIRIN 325 MG PO TABS: 325.0000 mg | ORAL_TABLET | Freq: Every day | ORAL | Status: DC

## 2015-06-05 NOTE — Progress Notes (Signed)
  Echocardiogram 2D Echocardiogram has been performed.  Darlina Sicilian M 06/05/2015, 9:46 AM

## 2015-06-05 NOTE — Progress Notes (Signed)
FPTS Interim Progress Note  S: Stopped by patient's room to evaluate for readiness for discharge.  Spoke to daughter.  Per daughter, patient is at her mental baseline.  Reviewed echocardiogram and MRI with patient and daughter.  Discussed follow up scheduled for this week.  They voice good understanding and voice no additional concerns/ questions.  O: BP 130/42 mmHg  Pulse 70  Temp(Src) 98.2 F (36.8 C) (Oral)  Resp 18  Ht 5' 3.5" (1.613 m)  Wt 149 lb 4.8 oz (67.722 kg)  BMI 26.03 kg/m2  SpO2 96%  Gen: awake, alert, well appearing, sitting up at bedside Pulm: normal work of breathing on RA Neuro: follows commands, speech somewhat slurred but baseline per daughter.  A/P: Brittany Chung is a 75 y.o. female that was admitted for possible acute stroke.  She was ruled  - Discharge instructions reviewed - Follow up outpatient this week  Janora Norlander, DO 06/05/2015, 3:19 PM PGY-2, [facility] Medicine Service pager [phone]

## 2015-06-05 NOTE — Progress Notes (Signed)
Patient left unit on wheelchair.

## 2015-06-05 NOTE — Progress Notes (Signed)
Family Medicine Teaching Service Daily Progress Note Intern Pager: [phone]  Patient name: Brittany Chung Medical record number: [id number] Date of birth: 05/21/1941 Age: 75 y.o. Gender: female  Primary Care Provider: Dimas Chyle, MD Consultants: Neurology Code Status: Full  Pt Overview and Major Events to Date:  12/30 - Admitted to [place] for stroke workup 12/31 - Neurology signed off  Assessment and Plan: Brittany Chung is a 75 y.o. female presenting with slurred speech and weakness that started 06/03/15 . PMH is significant for CVA, carotid endarterectomy, type 2 diabetes mellitus, memory loss.  Slurred Speech/Weakness: Suspicious for TIA vs stroke. Patient is at risk due to hx of CVA in 2006 and previous carotid endarterectomy. Takes daily 81 mg ASA. Possible occlusion of extracranial or intracranial arteries. Neuro exam unremarkable except for minor slurred speech. CT head negative for acute process but with chronic atrophic and ischemic changes. EKG unremarkable for arrhythmia. Infectious causes unlikely; pt afebrile, WBC WNL, UA neg for nitrites or leukocytes. Currently hemodynamically stable. Acute stroke has been ruled out by present imaging. Differential diagnosis may include panic attack.  - SLP recommending dysphagia 2 diet; await final recommendations for discharge - MRI/MRA brain showed an area of restricted diffusion in the posterior limb of the internal capsule, believed to be artifact by Neurology - Carotid dopplers showed 1-39% ICA stenosis bilaterally - Echocardiogram ordered; follow-up results - Monitor on telemetry - Discontinue EEG order, as unlikely to provide new information 2 days out from presentation and no prior history of seizures - Lipid panel showed cholesterol 136, HDL 60, LDL 57 -Continue asa to 325 mg PO per neurology - Permissive hypertension; hold home enalapril until discharge - Neurology consulted, signed off 06/04/15 and did not recommend  follow-up - PT/OT evaluations --> no follow-up needed   AKI: Increased SCr at 1.34. Baseline of 0.94. 1.12 on admission. Likely pre-renal due to decreased PO.  - Continue to monitor with BMPs.   Psych: Depression, Bipolar Disorder, ADHD: No manic or depressive symptoms at this time.  - Continue home Zoloft 100 mg daily - Hold home Vyvance - Daughter to call outpatient psychiatrist Dr. Geralyn Flash for close follow-up appointment  Mild Cognitive Impairment: Follows with Dr. McDiarmid at geriatric clinic - Continue home Aricept 5 mg daily  Hyperlipidemia: Controlled. Last Lipid Panel on 09/28/14: Cholesterol 180, Triglycerides 140, HDL 67, LDL 85 - Lipid Panel ordered, showed cholesterol 136, HDL 60, LDL 57 - Continue home Simvastatin 40 mg daily  Diabetes: Relatively controlled. Last Hgb A1c on 09/28/14 was 7.1. Glucose slightly elevated today at 124. - Hgb A1C in process - Hold home Metformin and Januvia  - Obtain q ac and hs CBGs. Have been in the low to mid 100s.   FEN/GI: Dysphagia 2 diet, transition to heart healthy diet once cleared by SLP Prophylaxis: Heparin  Disposition: Admitted to inpatient family medicine teaching service for stroke workup. Neurology has signed off. Anticipate discharge today, 06/05/15, once ECHO completed and SLP has left final recommendations.   Subjective:  - Decreased PO, as does not like diet/hospital food - Believes speech is back to baseline - Denies any weakness  Objective: Temp:  [98 F (36.7 C)-98.4 F (36.9 C)] 98.4 F (36.9 C) (01/01 1000) Pulse Rate:  [66-98] 66 (01/01 1000) Resp:  [16-18] 18 (01/01 1000) BP: (118-150)/(39-54) 128/53 mmHg (01/01 1000) SpO2:  [96 %-100 %] 96 % (01/01 1000) Physical Exam: General: Well nourished woman, sitting up in bed  HEENT: MMM. Few teeth.  Cardiovascular:  RRR, no m/r/g Respiratory: Mild bibasilar crackles, no increased WOB Abdomen: +BS, soft, NT, ND Extremities: Full strength and ROM Neuro: Speech  clear and understandable with minimal slurring (few teeth), improved from yesterday. No facial droop. CNII-XII grossly intact. AOx4.   Laboratory:  Recent Labs Lab 06/03/15 1523 06/05/15 0052  WBC 7.7 9.4  HGB 10.8* 11.5*  HCT 33.3* 35.3*  PLT 173 215    Recent Labs Lab 06/03/15 1523 06/04/15 1138 06/05/15 0052  NA 145 143 141  K 5.0 4.2 4.0  CL 112* 110 104  CO2 26 24 27   BUN 18 16 19   CREATININE 1.12* 1.27* 1.34*  CALCIUM 9.5 9.2 9.6  GLUCOSE 122* 140* 122*    Imaging/Diagnostic Tests: Ct Head Wo Contrast  06/03/2015  CLINICAL DATA:  Dizziness EXAM: CT HEAD WITHOUT CONTRAST TECHNIQUE: Contiguous axial images were obtained from the base of the skull through the vertex without intravenous contrast. COMPARISON:  10/26/2014 FINDINGS: Bony calvarium is intact. Mild atrophic changes are noted. Mild chronic white matter ischemic change is seen. No findings to suggest acute hemorrhage, acute infarct or space-occupying mass lesion are noted. IMPRESSION: Chronic atrophic and ischemic changes. No acute abnormality is noted. Electronically Signed   By: Inez Catalina M.D.   On: 06/03/2015 14:45   Mr Brain Wo Contrast  06/04/2015  CLINICAL DATA:  75 year old female with transient severe symptoms subsequent improvement though left with persistent right facial droop and dysarthria. Suspected threatened fairly large stroke with improvement of most symptoms with the dysarthria/facial droop as persistent sequela. Initial encounter. EXAM: MRI HEAD WITHOUT CONTRAST MRA HEAD WITHOUT CONTRAST TECHNIQUE: Multiplanar, multiecho pulse sequences of the brain and surrounding structures were obtained without intravenous contrast. Angiographic images of the head were obtained using MRA technique without contrast. COMPARISON:  CT head 06/03/2015.  MR brain 10/26/2014. FINDINGS: MRI HEAD FINDINGS The patient was unable to remain motionless for the exam. Small or subtle lesions could be overlooked. There is a  very subtle asymmetry on diffusion-weighted imaging affecting the posterior most aspect of the posterior internal capsule, 2-3 mm diameter, seen only on image 22 series 3. This cannot be confirmed on coronal DWI nor on T2 or FLAIR sequences. It must be considered as equivocal/questionable for acute ischemia. Followup scan of 1 week could be helpful in further evaluation. Generalized atrophy. Moderate to severe chronic microvascular ischemic change throughout the white matter. Scattered areas of chronic lacunar infarction. Flow voids are maintained throughout the carotid, basilar, and vertebral arteries. There are no areas of chronic hemorrhage. Partial empty sella. Pineal and cerebellar tonsils unremarkable. No upper cervical lesions. BILATERAL cataract extraction. No sinus or mastoid disease of significance. Compared with prior MR from 2016, similar degree of atrophy and small vessel disease. No abnormalities were seen in the LEFT posterior limb internal capsule. MRA HEAD FINDINGS The LEFT internal carotid artery appears widely patent. The RIGHT internal carotid artery, slightly degraded by motion and tortuosity, particularly the supraclinoid segment, is felt to be widely patent as well. The LEFT ICA is slightly larger due to a robust asymmetric contribution of both anterior cerebral arteries from the LEFT. RIGHT A1 ACA is atretic/hypoplastic. No proximal RIGHT or LEFT MCA disease.  No MCA branch occlusion. Basilar artery widely patent with LEFT vertebral dominant. No PCA disease. No cerebellar branch occlusion. No intracranial aneurysm. IMPRESSION: Motion degraded examination demonstrating possible 2-3 mm area of restricted diffusion in the posterior limb internal capsule. This must be considered equivocal/even artifactual, given the lack of correlative restriction  on coronal DWI. Consider follow-up in 1 week. Advanced atrophy and small vessel disease. No visible extracranial or intracranial stenosis of  significance. Electronically Signed   By: Staci Righter M.D.   On: 06/04/2015 10:56   Mr Mra Head/brain Wo Cm  06/04/2015  CLINICAL DATA:  75 year old female with transient severe symptoms subsequent improvement though left with persistent right facial droop and dysarthria. Suspected threatened fairly large stroke with improvement of most symptoms with the dysarthria/facial droop as persistent sequela. Initial encounter. EXAM: MRI HEAD WITHOUT CONTRAST MRA HEAD WITHOUT CONTRAST TECHNIQUE: Multiplanar, multiecho pulse sequences of the brain and surrounding structures were obtained without intravenous contrast. Angiographic images of the head were obtained using MRA technique without contrast. COMPARISON:  CT head 06/03/2015.  MR brain 10/26/2014. FINDINGS: MRI HEAD FINDINGS The patient was unable to remain motionless for the exam. Small or subtle lesions could be overlooked. There is a very subtle asymmetry on diffusion-weighted imaging affecting the posterior most aspect of the posterior internal capsule, 2-3 mm diameter, seen only on image 22 series 3. This cannot be confirmed on coronal DWI nor on T2 or FLAIR sequences. It must be considered as equivocal/questionable for acute ischemia. Followup scan of 1 week could be helpful in further evaluation. Generalized atrophy. Moderate to severe chronic microvascular ischemic change throughout the white matter. Scattered areas of chronic lacunar infarction. Flow voids are maintained throughout the carotid, basilar, and vertebral arteries. There are no areas of chronic hemorrhage. Partial empty sella. Pineal and cerebellar tonsils unremarkable. No upper cervical lesions. BILATERAL cataract extraction. No sinus or mastoid disease of significance. Compared with prior MR from 2016, similar degree of atrophy and small vessel disease. No abnormalities were seen in the LEFT posterior limb internal capsule. MRA HEAD FINDINGS The LEFT internal carotid artery appears widely  patent. The RIGHT internal carotid artery, slightly degraded by motion and tortuosity, particularly the supraclinoid segment, is felt to be widely patent as well. The LEFT ICA is slightly larger due to a robust asymmetric contribution of both anterior cerebral arteries from the LEFT. RIGHT A1 ACA is atretic/hypoplastic. No proximal RIGHT or LEFT MCA disease.  No MCA branch occlusion. Basilar artery widely patent with LEFT vertebral dominant. No PCA disease. No cerebellar branch occlusion. No intracranial aneurysm. IMPRESSION: Motion degraded examination demonstrating possible 2-3 mm area of restricted diffusion in the posterior limb internal capsule. This must be considered equivocal/even artifactual, given the lack of correlative restriction on coronal DWI. Consider follow-up in 1 week. Advanced atrophy and small vessel disease. No visible extracranial or intracranial stenosis of significance. Electronically Signed   By: Staci Righter M.D.   On: 06/04/2015 10:56    Hillary Corinda Gubler, MD 06/05/2015, 10:53 AM PGY-1, [place] Intern pager: [phone], text pages welcome

## 2015-06-05 NOTE — Progress Notes (Signed)
Speech Language Pathology Treatment: Dysphagia  Patient Details Name: Brittany Chung MRN: [id number] DOB: 04-21-1941 Today's Date: 06/05/2015 Time: 6754-4920 SLP Time Calculation (min) (ACUTE ONLY): 8 min  Assessment / Plan / Recommendation Clinical Impression  F/u after yesterday's swallow assessment.  Pt with ill-fitting lower plates, but is able to functionally masticate regular solids.  She is tolerating mixed consistencies well - recommend advancing diet to regular, thin liquids; meds whole in liquid.  Dysphagia has resolved.  No speech/language evaluation is necessary.  Pt is preparing for D/C home today.     HPI HPI: Brittany Chung is a 75 y.o. female presenting with slurred speech and weakness that started earlier today . PMH is significant for CVA, carotid endarterectomy, type 2 diabetes mellitus, memory loss. CT of head negative for acute abnormalities. MRI of brain to be completed.       SLP Plan  All goals met     Recommendations  Diet recommendations: Regular;Thin liquid Liquids provided via: Cup;Straw Medication Administration: Whole meds with liquid Supervision: Patient able to self feed              Plan: All goals met   Juan Quam Laurice 06/05/2015, 3:28 PM

## 2015-06-05 NOTE — Progress Notes (Signed)
Patient being d/c, discharge instructions given to patient and care giver. Patient awaiting to receive flu shot.

## 2015-06-05 NOTE — Discharge Instructions (Signed)
Brittany Chung,  You were admitted for slurred speech to rule-out an acute stroke. Neurology was consulting and is confident no new stroke occurred. We performed testing to check your risk for having a stroke, and these were normal. Your carotid arteries had good flow. The only medication change was increasing your aspirin dose to 325 mg daily from 81 mg daily.  Please follow-up with your outpatient psychiatrist to see if any medication adjustments should be made and to discuss further whether this episode could have been a panic attack.   At your [facility] follow-up visit, your kidney function will be rechecked, as your creatinine was slightly elevated during this hospitalization, likely because you were not eating very much.

## 2015-06-07 LAB — HEMOGLOBIN A1C
Hgb A1c MFr Bld: 6.8 % — ABNORMAL HIGH (ref 4.8–5.6)
MEAN PLASMA GLUCOSE: 148 mg/dL

## 2015-06-09 ENCOUNTER — Inpatient Hospital Stay: Payer: Self-pay | Admitting: Internal Medicine

## 2015-06-10 ENCOUNTER — Ambulatory Visit (INDEPENDENT_AMBULATORY_CARE_PROVIDER_SITE_OTHER): Payer: Commercial Managed Care - HMO | Admitting: Internal Medicine

## 2015-06-10 ENCOUNTER — Encounter: Payer: Self-pay | Admitting: Internal Medicine

## 2015-06-10 ENCOUNTER — Telehealth (HOSPITAL_COMMUNITY): Payer: Self-pay

## 2015-06-10 VITALS — BP 134/46 | HR 81 | Temp 97.9°F | Wt 139.4 lb

## 2015-06-10 DIAGNOSIS — N179 Acute kidney failure, unspecified: Secondary | ICD-10-CM

## 2015-06-10 DIAGNOSIS — F39 Unspecified mood [affective] disorder: Secondary | ICD-10-CM | POA: Diagnosis not present

## 2015-06-10 DIAGNOSIS — F310 Bipolar disorder, current episode hypomanic: Secondary | ICD-10-CM

## 2015-06-10 DIAGNOSIS — R5383 Other fatigue: Secondary | ICD-10-CM | POA: Diagnosis not present

## 2015-06-10 DIAGNOSIS — R4586 Emotional lability: Secondary | ICD-10-CM

## 2015-06-10 DIAGNOSIS — Z8673 Personal history of transient ischemic attack (TIA), and cerebral infarction without residual deficits: Secondary | ICD-10-CM

## 2015-06-10 DIAGNOSIS — R630 Anorexia: Secondary | ICD-10-CM | POA: Diagnosis not present

## 2015-06-10 DIAGNOSIS — R748 Abnormal levels of other serum enzymes: Secondary | ICD-10-CM

## 2015-06-10 DIAGNOSIS — R7989 Other specified abnormal findings of blood chemistry: Secondary | ICD-10-CM

## 2015-06-10 LAB — BASIC METABOLIC PANEL WITH GFR
BUN: 31 mg/dL — AB (ref 7–25)
CO2: 27 mmol/L (ref 20–31)
Calcium: 9.5 mg/dL (ref 8.6–10.4)
Chloride: 104 mmol/L (ref 98–110)
Creat: 1.29 mg/dL — ABNORMAL HIGH (ref 0.60–0.93)
GFR, EST NON AFRICAN AMERICAN: 40 mL/min — AB (ref >=60)
GFR, Est African American: 46 mL/min — ABNORMAL LOW (ref >=60)
GLUCOSE: 120 mg/dL — AB (ref 65–99)
POTASSIUM: 4.8 mmol/L (ref 3.5–5.3)
Sodium: 138 mmol/L (ref 135–146)

## 2015-06-10 MED ORDER — LORAZEPAM 0.5 MG PO TABS: 0.5000 mg | ORAL_TABLET | Freq: Every day | ORAL | Status: DC | PRN

## 2015-06-10 NOTE — Telephone Encounter (Signed)
Telephone call with Ms. Ids, patient's daughter after she left a message her Mother had to go into the ED on 06/03/15 that they stated was due to severe anxiety.  Collateral reports increased and severe anxiety attacks with her Mother, to the point patient ended up in the hospital for 3 days.  Reports they checked for heart or stroke problems and they found it to be severe anxiety.  Collateral requested an earlier appointment if possible with Dr. Adele Schilder but he had nothing before 06/29/15.  Discussed with Dr. Adele Schilder as he reviewed hospital discharge from 06/03/15 visit and reported he wanted patient to stop Vyvanse and to begin Ativan 0.5 mg, one a day as needed, #30 with no refills.  Informed Ms. Ids of this change and also that Dr. Adele Schilder wanted patient to follow up with a neurologist as collateral stated she was concerned about taking patient off of Vyvanse and did not think these changes would be sufficient.  Requested to keep patient on a call back list for cancellations for Dr. Adele Schilder and requested collateral try the changes and then call our office back in the coming week if not helping or any worsening of symptoms.  Also, agreed to question if Dr. Adele Schilder would be okay with patient seeing another provider at our office if they can get in before 06/29/15.  Requested collateral call back in the coming week for follow up and called in new Ativan order to [facility] on [address].

## 2015-06-10 NOTE — Progress Notes (Signed)
Subjective: Brittany Chung is a 75 y.o. female patient of Brittany Chyle, MD, accompanied by daughter Brittany Chung, presenting for hospital follow-up of dysarthria that occurred after acting out a dream.   PMH: CVA, carotid endarterectomy, T2DM, bipolar disorder with recent episode of mania, and memory loss  Dysarthria: - Completely resolved.  - Stroke work-up completed during hospitalization. Episode unlikely a TIA or seizure; neurology did not recommend any follow-up.  - Possibly 2/2 BPD or panic attack.   Mood: - Daughter Brittany Chung reports concerns about mother's labile mood. - Yesterday, the patient suddenly became angry and began yelling and pushing family members over a benign comment her daughter made about covering a plate of food with saran wrap.  Brittany Chung touched base with psychiatrist Dr. Marguerite Chung office 06/10/15. Nurse, in consultation with Dr. Adele Chung, recommended stopping Vyvanse (which patient has been taking for years for ADHD) and using ativan 0.5 mg once daily as needed to help with anxiety.  - Patient has appointment with Dr. Adele Chung 06/29/2015 but is on cancellation list to be seen sooner if possible.  - Daughter gives her mom her medications to keep her on schedule, as patient can be "scatter-brained" due to her ADHD and memory loss, but notes that she has found the pills in her mom's pocket on two occasions this week. Does not believe her mom did this intentionally.  - Patient states she has never had a traditional panic attack in which she feels her heart racing or has trouble breathing. Instead she gets overwhelmed and decides to go to sleep. She does not identify with her diagnosis of bipolar disorder.  - Patient only notes that she has felt more tired than usual since leaving hospital.  - Stressors include grandson who has to get surgery for pectus excavatum.  - She has been on aricept 5 mg daily for memory loss, sertraline 100 mg daily and vyvanse 30 mg daily.   HTN: -  Well-controlled on enalapril 2.5 mg daily  - ROS: Slightly decreased appetite, decreased stamina - Nonsmoker  Objective: BP 134/46 mmHg  Pulse 81  Temp(Src) 97.9 F (36.6 C) (Oral)  Wt 139 lb 6.4 oz (63.231 kg) Gen: Well-appearing 75 y.o. female in no distress HEENT: MMM, PERRLA, false teeth in place  Cardiac: RRR, S1, S2, no m/r/g Pulm: CTAB, no increased work of breathing Abdominal: +BS, soft, NT, ND Neuro: AOx3. No focal deficits.  Psych: Mood normal. Affect somewhat flat. Appearance is neat and well put together.   Assessment/Plan: Brittany Chung is a 75 y.o. female here for hospital follow-up of atypical presentation of slurred speech likely with psychiatric contribution. No return of symptoms but mood has been more labile.   Recommend follow-up in about a month to readdress mood and fatigue after she has had her appointment with her outpatient psychiatrist.  . Mood swings ([place]) - Agree that close follow-up with psychiatrist Dr. Adele Chung will be most helpful. - Recommended journaling moods to determine when to take ativan or trialing a bedtime dose. - Agree that stopping vyvanse until psychiatry follow-up may help with worsening anxiety. - Provided list of local psychologists who see elderly, Medicare patients, as patient used to find benefit in talking with a counselor  Fatigue - Provided reassurance that increased fatigue is common after a hospitalization and is harder as patients get older.  - Prescribed glucerna shakes to supplement regular meals, as these were recommended by nutritionist during hospitalization and family interested in continuing. (Asked for ensure but glucerna would be  better considering T2DM)  History of stroke - Continue aspirin at increased dose of 325 mg  Elevated serum creatinine - SCr still elevated at 1.29 today, up from baseline of ~1-1.1 - Suspect kidney function still recovering from decreased PO intake during recent hospitalization -  Continue to monitor  Olene Floss, MD [place], PGY-1

## 2015-06-10 NOTE — Patient Instructions (Signed)
Ms. Kogan,  I'm glad your initial symptoms have resolved.  I agree that seeing your psychiatrist as soon as possible would be most helpful. I recommend journaling your mood in the meantime and using the ativan your psychiatrist's office has prescribed when you are feeling overwhelmed.  I will contact you if your kidney function labs are abnormal.   I have also provided a list of local psychologists. You may find the website psychologytoday.com useful.  Thank you, Dr. Ola Spurr

## 2015-06-11 DIAGNOSIS — R4586 Emotional lability: Secondary | ICD-10-CM | POA: Insufficient documentation

## 2015-06-11 DIAGNOSIS — R5383 Other fatigue: Secondary | ICD-10-CM | POA: Insufficient documentation

## 2015-06-11 DIAGNOSIS — R7989 Other specified abnormal findings of blood chemistry: Secondary | ICD-10-CM | POA: Insufficient documentation

## 2015-06-11 MED ORDER — GLUCERNA ADVANCE SHAKE PO LIQD: 1.0000 | Freq: Every day | ORAL | Status: DC

## 2015-06-11 NOTE — Assessment & Plan Note (Signed)
-   Continue aspirin at increased dose of 325 mg

## 2015-06-11 NOTE — Assessment & Plan Note (Signed)
-   SCr still elevated at 1.29 today, up from baseline of ~1-1.1 - Suspect kidney function still recovering from decreased PO intake during recent hospitalization - Continue to monitor

## 2015-06-11 NOTE — Assessment & Plan Note (Addendum)
-   Agree that close follow-up with psychiatrist Dr. Adele Schilder will be most helpful. - Recommended journaling moods to determine when to take ativan or trialing a bedtime dose. - Agree that stopping vyvanse until psychiatry follow-up may help with worsening anxiety/irritability  - Provided list of local psychologists who see elderly, Medicare patients, as patient used to find benefit in talking with a counselor

## 2015-06-11 NOTE — Assessment & Plan Note (Addendum)
-   Provided reassurance that increased fatigue is common after a hospitalization and is harder as patients get older.  - Prescribed glucerna shakes to supplement regular meals, as these were recommended by nutritionist during hospitalization and family interested in continuing. (Asked for ensure but glucerna would be better considering T2DM)

## 2015-06-21 ENCOUNTER — Other Ambulatory Visit: Payer: Self-pay | Admitting: Neurology

## 2015-06-29 ENCOUNTER — Ambulatory Visit (INDEPENDENT_AMBULATORY_CARE_PROVIDER_SITE_OTHER): Payer: Medicare HMO | Admitting: Psychiatry

## 2015-06-29 ENCOUNTER — Encounter (HOSPITAL_COMMUNITY): Payer: Self-pay | Admitting: Psychiatry

## 2015-06-29 VITALS — BP 133/66 | HR 79 | Ht 63.5 in | Wt 146.0 lb

## 2015-06-29 DIAGNOSIS — F902 Attention-deficit hyperactivity disorder, combined type: Secondary | ICD-10-CM

## 2015-06-29 DIAGNOSIS — F988 Other specified behavioral and emotional disorders with onset usually occurring in childhood and adolescence: Secondary | ICD-10-CM

## 2015-06-29 DIAGNOSIS — G3184 Mild cognitive impairment, so stated: Secondary | ICD-10-CM | POA: Diagnosis not present

## 2015-06-29 DIAGNOSIS — F310 Bipolar disorder, current episode hypomanic: Secondary | ICD-10-CM

## 2015-06-29 MED ORDER — QUETIAPINE FUMARATE 25 MG PO TABS: ORAL_TABLET | ORAL | Status: DC

## 2015-06-29 MED ORDER — SERTRALINE HCL 100 MG PO TABS: ORAL_TABLET | ORAL | Status: DC

## 2015-06-29 NOTE — Progress Notes (Signed)
[place] Progress Note  Brittany Chung [id number] 75 y.o.  06/29/2015 3:21 PM  Chief Complaint:  I was admitted to the hospital 2 weeks ago.  I was confused.               History of Present Illness:  Brittany Chung came for her followup appointment with her daughter.  Patient was admitted 3 weeks ago on medical floor due to an episode of confusion , dysarthria and having strokelike symptoms.  However patient and daughter told that stroke has been ruled out and she was told not to follow up with neurology.  As per daughter , patient has been under a lot of stress recently because find out that son is incarcerated and not able to come out from jail for another 2 years.  Patient told that family was hiding this news from her but she find out and admitted since then has been a lot of stress.  She also admitted not taking the medication as prescribed and may have cause confusion and having the episode which led to hospitalization.  She is no longer taking Vyvanse.  We have recommended to take Ativan however patient does not feel it is helping her anxiety.  She admitted more isolated, withdrawn, decreased energy and having racing thoughts.  She sleeping to 3 hours.  She is concerned about her son however did not provide much detail.  She admitted that she need to see a counselor cause there are a lot of things going on and she like to get some help.  Her best friend died recently and she also going through grief.  Patient denies any irritability, anger, mood swing but admitted more depressed and sad mood.  She has no motivation to do things.  She admitted some memory impairment and does not remember very well.  She is concerned about her own physical health.  Her appetite is okay.  Her vitals are stable.  She denies any paranoia or any hallucination.  She is taking Zoloft 100 mg.  Her last blood work shows BUN 31, creatinine 1.29 and a glucose 120.  Her hemoglobin A1c is 6.8.  Patient denies  any tremors, shakes, EPS.  She denies any active or passive suicidal thoughts.  She lives with her daughter and 5 grandkids.  Suicidal Ideation: No Plan Formed: No Patient has means to carry out plan: No  Homicidal Ideation: No Plan Formed: No Patient has means to carry out plan: No  Medical History; Patient has history of irregular heartbeat, stroke, cardiac stenosis, hypertension and diabetes mellitus .  She [facility] to [facility].      Past Psychiatric History/Hospitalization(s) Patient denies any history of suicidal attempt, inpatient psychiatric treatment of any psychosis.  As per daughter she has history of bipolar disorder but she feels embarrassed to accept it.  She was given Seroquel by her primary care physician but she stopped taking it because of sedation.   She was taking Remeron which was stopped when Seroquel was started a few months ago. Anxiety: Yes Bipolar Disorder: Yes Depression: Yes Mania: No Psychosis: No Schizophrenia: No Personality Disorder: No Hospitalization for psychiatric illness: No History of Electroconvulsive Shock Therapy: No Prior Suicide Attempts: No   Review of Systems  Eyes: Negative for blurred vision.  Cardiovascular: Negative for palpitations.  Skin: Negative for itching and rash.  Neurological: Negative for dizziness and tremors.    Psychiatric: Agitation: No Hallucination: No Depressed Mood: No Insomnia: No Hypersomnia: No Altered  Concentration: No Feels Worthless: No Grandiose Ideas: No Belief In Special Powers: No New/Increased Substance Abuse: No Compulsions: No  Neurologic: Headache: No Seizure: No Paresthesias: No    Outpatient Encounter Prescriptions as of 06/29/2015  Medication Sig  . aspirin 325 MG tablet Take 1 tablet (325 mg total) by mouth daily.  Marland Kitchen b complex vitamins tablet Take 1 tablet by mouth daily.  . bimatoprost (LUMIGAN) 0.03 % ophthalmic drops Place 1 drop into both eyes at bedtime.    .  Coenzyme Q10 (CO Q-10) 100 MG CAPS Take by mouth.  . donepezil (ARICEPT) 5 MG tablet TAKE 1 TABLET (5 MG TOTAL) BY MOUTH AT BEDTIME.  . enalapril (VASOTEC) 2.5 MG tablet TAKE 1 TABLET BY MOUTH EVERY DAY  . Glucos-MSM-C-Mn-Ginger-Willow (MSM GLUCOSAMINE COMPLEX PO) Take by mouth.  . Incontinence Supplies MISC Use as directed  . Incontinence Supply Disposable (REALITY INCONTINENT BRIEFS SM) MISC Use as needed.  Marland Kitchen JANUVIA 100 MG tablet TAKE 1 TABLET BY MOUTH EVERY DAY  . metFORMIN (GLUCOPHAGE) 850 MG tablet TAKE 1 TABLET (850 MG TOTAL) BY MOUTH 2 (TWO) TIMES DAILY WITH A MEAL.  . Multiple Vitamins-Minerals (MULTIVITAMIN WITH MINERALS) tablet Take 1 tablet by mouth every evening.   . Nutritional Supplements (GLUCERNA ADVANCE SHAKE) LIQD Take 1 Bottle by mouth daily.  . Omega-3 Fatty Acids (FISH OIL) 1000 MG CAPS Take 1 capsule by mouth daily.  . QUEtiapine (SEROQUEL) 25 MG tablet Take 1-2 tab at bed time  . rOPINIRole (REQUIP) 1 MG tablet Take 3 tablets (3 mg total) by mouth at bedtime.  . sertraline (ZOLOFT) 100 MG tablet Take 1 and 1/2 tab daily  . simvastatin (ZOCOR) 40 MG tablet Take 1 tablet (40 mg total) by mouth at bedtime.  . [DISCONTINUED] donepezil (ARICEPT) 5 MG tablet TAKE 1 TABLET (5 MG TOTAL) BY MOUTH AT BEDTIME.  . [DISCONTINUED] lisdexamfetamine (VYVANSE) 30 MG capsule Take 1 capsule (30 mg total) by mouth daily.  . [DISCONTINUED] LORazepam (ATIVAN) 0.5 MG tablet Take 1 tablet (0.5 mg total) by mouth daily as needed for anxiety.  . [DISCONTINUED] sertraline (ZOLOFT) 100 MG tablet Take 1 tablet (100 mg total) by mouth daily.   No facility-administered encounter medications on file as of 06/29/2015.    Recent Results (from the past 2160 hour(s))  Rapid Strep A     Status: None   Collection Time: 04/11/15 12:11 PM  Result Value Ref Range   Rapid Strep A Screen Negative Negative  CBG monitoring, ED     Status: Abnormal   Collection Time: 06/03/15  1:42 PM  Result Value Ref Range    Glucose-Capillary 139 (H) 65 - 99 mg/dL  Urinalysis, Routine w reflex microscopic     Status: Abnormal   Collection Time: 06/03/15  1:55 PM  Result Value Ref Range   Color, Urine YELLOW YELLOW   APPearance CLEAR CLEAR   Specific Gravity, Urine 1.007 1.005 - 1.030   pH 6.5 5.0 - 8.0   Glucose, UA NEGATIVE NEGATIVE mg/dL   Hgb urine dipstick TRACE (A) NEGATIVE   Bilirubin Urine NEGATIVE NEGATIVE   Ketones, ur NEGATIVE NEGATIVE mg/dL   Protein, ur NEGATIVE NEGATIVE mg/dL   Nitrite NEGATIVE NEGATIVE   Leukocytes, UA NEGATIVE NEGATIVE  Urine microscopic-add on     Status: Abnormal   Collection Time: 06/03/15  1:55 PM  Result Value Ref Range   Squamous Epithelial / LPF 0-5 (A) NONE SEEN   WBC, UA 0-5 0 - 5 WBC/hpf   RBC /  HPF 0-5 0 - 5 RBC/hpf   Bacteria, UA RARE (A) NONE SEEN  Basic metabolic panel     Status: Abnormal   Collection Time: 06/03/15  3:23 PM  Result Value Ref Range   Sodium 145 135 - 145 mmol/L   Potassium 5.0 3.5 - 5.1 mmol/L   Chloride 112 (H) 101 - 111 mmol/L   CO2 26 22 - 32 mmol/L   Glucose, Bld 122 (H) 65 - 99 mg/dL   BUN 18 6 - 20 mg/dL   Creatinine, Ser 1.12 (H) 0.44 - 1.00 mg/dL   Calcium 9.5 8.9 - 10.3 mg/dL   GFR calc non Af Amer 46 (L) >60 mL/min   GFR calc Af Amer 54 (L) >60 mL/min    Comment: (NOTE) The eGFR has been calculated using the CKD EPI equation. This calculation has not been validated in all clinical situations. eGFR's persistently <60 mL/min signify possible Chronic Kidney Disease.    Anion gap 7 5 - 15  CBC with Differential     Status: Abnormal   Collection Time: 06/03/15  3:23 PM  Result Value Ref Range   WBC 7.7 4.0 - 10.5 K/uL   RBC 3.75 (L) 3.87 - 5.11 MIL/uL   Hemoglobin 10.8 (L) 12.0 - 15.0 g/dL   HCT 33.3 (L) 36.0 - 46.0 %   MCV 88.8 78.0 - 100.0 fL   MCH 28.8 26.0 - 34.0 pg   MCHC 32.4 30.0 - 36.0 g/dL   RDW 13.7 11.5 - 15.5 %   Platelets 173 150 - 400 K/uL   Neutrophils Relative % 36 %   Neutro Abs 2.8 1.7 - 7.7  K/uL   Lymphocytes Relative 51 %   Lymphs Abs 4.0 0.7 - 4.0 K/uL   Monocytes Relative 8 %   Monocytes Absolute 0.6 0.1 - 1.0 K/uL   Eosinophils Relative 4 %   Eosinophils Absolute 0.3 0.0 - 0.7 K/uL   Basophils Relative 1 %   Basophils Absolute 0.0 0.0 - 0.1 K/uL  Hemoglobin A1c     Status: Abnormal   Collection Time: 06/04/15  3:39 AM  Result Value Ref Range   Hgb A1c MFr Bld 6.8 (H) 4.8 - 5.6 %    Comment: (NOTE)         Pre-diabetes: 5.7 - 6.4         Diabetes: >6.4         Glycemic control for adults with diabetes: <7.0    Mean Plasma Glucose 148 mg/dL    Comment: (NOTE) Performed At: [facility] [place], [place] [id number] Lindon Romp MD NU:[id number]   Lipid panel     Status: None   Collection Time: 06/04/15  3:39 AM  Result Value Ref Range   Cholesterol 136 0 - 200 mg/dL   Triglycerides 94 <150 mg/dL   HDL 60 >40 mg/dL   Total CHOL/HDL Ratio 2.3 RATIO   VLDL 19 0 - 40 mg/dL   LDL Cholesterol 57 0 - 99 mg/dL    Comment:        Total Cholesterol/HDL:CHD Risk Coronary Heart Disease Risk Table                     Men   Women  1/2 Average Risk   3.4   3.3  Average Risk       5.0   4.4  2 X Average Risk   9.6   7.1  3 X Average Risk  23.4   11.0        Use the calculated Patient Ratio above and the CHD Risk Table to determine the patient's CHD Risk.        ATP III CLASSIFICATION (LDL):  <100     mg/dL   Optimal  100-129  mg/dL   Near or Above                    Optimal  130-159  mg/dL   Borderline  160-189  mg/dL   High  >190     mg/dL   Very High   Glucose, capillary     Status: Abnormal   Collection Time: 06/04/15  8:33 AM  Result Value Ref Range   Glucose-Capillary 124 (H) 65 - 99 mg/dL  Glucose, capillary     Status: Abnormal   Collection Time: 06/04/15 11:26 AM  Result Value Ref Range   Glucose-Capillary 144 (H) 65 - 99 mg/dL  Basic metabolic panel     Status: Abnormal   Collection Time: 06/04/15 11:38 AM  Result Value  Ref Range   Sodium 143 135 - 145 mmol/L   Potassium 4.2 3.5 - 5.1 mmol/L   Chloride 110 101 - 111 mmol/L   CO2 24 22 - 32 mmol/L   Glucose, Bld 140 (H) 65 - 99 mg/dL   BUN 16 6 - 20 mg/dL   Creatinine, Ser 1.27 (H) 0.44 - 1.00 mg/dL   Calcium 9.2 8.9 - 10.3 mg/dL   GFR calc non Af Amer 40 (L) >60 mL/min   GFR calc Af Amer 46 (L) >60 mL/min    Comment: (NOTE) The eGFR has been calculated using the CKD EPI equation. This calculation has not been validated in all clinical situations. eGFR's persistently <60 mL/min signify possible Chronic Kidney Disease.    Anion gap 9 5 - 15  Glucose, capillary     Status: Abnormal   Collection Time: 06/04/15  4:29 PM  Result Value Ref Range   Glucose-Capillary 123 (H) 65 - 99 mg/dL  Glucose, capillary     Status: Abnormal   Collection Time: 06/04/15  9:40 PM  Result Value Ref Range   Glucose-Capillary 112 (H) 65 - 99 mg/dL   Comment 1 Notify RN    Comment 2 Document in Chart   CBC     Status: Abnormal   Collection Time: 06/05/15 12:52 AM  Result Value Ref Range   WBC 9.4 4.0 - 10.5 K/uL   RBC 4.05 3.87 - 5.11 MIL/uL   Hemoglobin 11.5 (L) 12.0 - 15.0 g/dL   HCT 35.3 (L) 36.0 - 46.0 %   MCV 87.2 78.0 - 100.0 fL   MCH 28.4 26.0 - 34.0 pg   MCHC 32.6 30.0 - 36.0 g/dL   RDW 13.4 11.5 - 15.5 %   Platelets 215 150 - 400 K/uL  Basic metabolic panel     Status: Abnormal   Collection Time: 06/05/15 12:52 AM  Result Value Ref Range   Sodium 141 135 - 145 mmol/L   Potassium 4.0 3.5 - 5.1 mmol/L   Chloride 104 101 - 111 mmol/L   CO2 27 22 - 32 mmol/L   Glucose, Bld 122 (H) 65 - 99 mg/dL   BUN 19 6 - 20 mg/dL   Creatinine, Ser 1.34 (H) 0.44 - 1.00 mg/dL   Calcium 9.6 8.9 - 10.3 mg/dL   GFR calc non Af Amer 37 (L) >60 mL/min   GFR calc Af Amer 43 (L) >  60 mL/min    Comment: (NOTE) The eGFR has been calculated using the CKD EPI equation. This calculation has not been validated in all clinical situations. eGFR's persistently <60 mL/min signify  possible Chronic Kidney Disease.    Anion gap 10 5 - 15  Glucose, capillary     Status: Abnormal   Collection Time: 06/05/15  6:27 AM  Result Value Ref Range   Glucose-Capillary 144 (H) 65 - 99 mg/dL   Comment 1 Notify RN    Comment 2 Document in Chart   Glucose, capillary     Status: Abnormal   Collection Time: 06/05/15 11:24 AM  Result Value Ref Range   Glucose-Capillary 107 (H) 65 - 99 mg/dL  BASIC METABOLIC PANEL WITH GFR     Status: Abnormal   Collection Time: 06/10/15  4:14 PM  Result Value Ref Range   Sodium 138 135 - 146 mmol/L   Potassium 4.8 3.5 - 5.3 mmol/L   Chloride 104 98 - 110 mmol/L   CO2 27 20 - 31 mmol/L   Glucose, Bld 120 (H) 65 - 99 mg/dL   BUN 31 (H) 7 - 25 mg/dL   Creat 1.29 (H) 0.60 - 0.93 mg/dL   Calcium 9.5 8.6 - 10.4 mg/dL   GFR, Est African American 46 (L) >=60 mL/min   GFR, Est Non African American 40 (L) >=60 mL/min    Comment:   The estimated GFR is a calculation valid for adults (>=19 years old) that uses the CKD-EPI algorithm to adjust for age and sex. It is   not to be used for children, pregnant women, hospitalized patients,    patients on dialysis, or with rapidly changing kidney function. According to the NKDEP, eGFR >89 is normal, 60-89 shows mild impairment, 30-59 shows moderate impairment, 15-29 shows severe impairment and <15 is ESRD.         Physical Exam: Constitutional:  BP 133/66 mmHg  Pulse 79  Ht 5' 3.5" (1.613 m)  Wt 146 lb (66.225 kg)  BMI 25.45 kg/m2  Musculoskeletal: Strength & Muscle Tone: within normal limits Gait & Station: normal Patient leans: Patient has a normal posture  Mental Status Examination;  Patient is groomed and well dressed female who appears to be her stated age.  She appears anxious and described her mood sad depressed.  Her affect is constricted.  Her speech is fast but clear and coherent. Her attention concentration is fair.  She denies any auditory or visual hallucination.  She denies any  active or passive suicidal thoughts or homicidal thoughts.  There were no paranoia or any delusions present at this time.  Her fund of knowledge is average.  Her thought process is circumstantial.  There were no flight of ideas or loose association.  Her psychomotor activity is increased. She is alert and oriented x3 however difficulty remembering things..  Her insight judgment and impulse control is okay.   Established Problem, Stable/Improving (1), Review of Psycho-Social Stressors (1), Review or order clinical lab tests (1), Review and summation of old records (2), Established Problem, Worsening (2), Review of Last Therapy Session (1), Review of Medication Regimen & Side Effects (2) and Review of New Medication or Change in Dosage (2)  Assessment: Axis I: ADD inattentive type, bipolar disorder type I, major depressive disorder recurrent mild, cognitive disorder NOS  Axis II: Deferred  Axis III:  Past Medical History  Diagnosis Date  . Irregular heart beat   . Depression   . Stroke [facility]) 2006  .  Cancer ([place])     skin  . Carotid stenosis     s/p R CEA 2006  . Cataract     s/p surgery 2007  . Memory loss    Plan:  I reviewed records from Hospital including recent blood work results and hemoglobin A1c.  Patient is experiencing increased psychosocial stressors due to family issues.  She is no longer taking Vyvanse .  She does not feel Ativan working.  I will discontinue Ativan and recommended to try increase Zoloft 150 mg daily.  I encouraged to restart Seroquel 25 mg at bedtime and may repeat if one tablet does not work.  In the past she had taken Seroquel but she complained that cause sedation .  Discuss in length medication side effects and benefits.  Recommended metabolic syndrome from Seroquel.  Patient has extensive cardiac history and we will defer adding any stimulant .  I also encouraged to see therapist for coping skills.  We will schedule appointment with Tharon Aquas .  I will see her  again in 3 weeks.  Time spent 25 minutes.  More than 50% of the time spent in psychoeducation, counseling and coordination of care.  Discuss safety plan that anytime having active suicidal thoughts or homicidal thoughts then patient need to call 911 or go to the local emergency room.   Glen Kesinger T., MD 06/29/2015

## 2015-07-02 ENCOUNTER — Other Ambulatory Visit: Payer: Self-pay | Admitting: Family Medicine

## 2015-07-04 NOTE — Telephone Encounter (Signed)
Rx filled.  Algis Greenhouse. Jerline Pain, [place] Resident PGY-2 07/04/2015 9:17 AM

## 2015-07-05 ENCOUNTER — Encounter: Payer: Self-pay | Admitting: Family Medicine

## 2015-07-05 ENCOUNTER — Other Ambulatory Visit: Payer: Self-pay | Admitting: Family Medicine

## 2015-07-05 ENCOUNTER — Ambulatory Visit (INDEPENDENT_AMBULATORY_CARE_PROVIDER_SITE_OTHER): Payer: Commercial Managed Care - HMO | Admitting: Family Medicine

## 2015-07-05 VITALS — BP 137/37 | HR 77 | Temp 98.5°F | Wt 141.0 lb

## 2015-07-05 DIAGNOSIS — R05 Cough: Secondary | ICD-10-CM

## 2015-07-05 DIAGNOSIS — R059 Cough, unspecified: Secondary | ICD-10-CM

## 2015-07-05 MED ORDER — ROPINIROLE HCL 1 MG PO TABS: 3.0000 mg | ORAL_TABLET | Freq: Every day | ORAL | Status: DC

## 2015-07-05 NOTE — Assessment & Plan Note (Signed)
Patient is here with reports of a cough for the past 2 weeks. Etiology currently unknown however signs and symptoms most consistent with likely viral pulmonary infection. Patient has been afebrile but states that she has had regular rhinorrhea and her cough is occasionally productive. She denies any significant shortness of breath but states that her cough has been occasionally waking her up at night. She denies any chest pain, orthopnea, or dyspnea on exertion. And no peripheral edema noted on exam making cardiac cause less likely. - Offered patient a chest x-ray today, patient stated that she is not feeling this was necessary so I withheld disorder. - I encouraged her to continue her current regimen for symptomatic relief. (Tylenol, Mucinex, hot steamy showers) - I encouraged her to use her incentive spirometer that she reports having at home from her previous admission for pneumonia. - I asked that she come back in 2 weeks if her symptoms persist or sooner if they worsen.  Next: If patient's symptoms persist over the next ~2 weeks some consideration should be made to possibly change her ACEi to an ARB.

## 2015-07-05 NOTE — Progress Notes (Signed)
COUGH Been ongoing for 2 weeks. No fevers. Some chills. Cough medicine has helped. Mostly dry cough, will occasionally get some thick mucus. Some stomach pain/discomfort after eating.   Has been coughing for 14 days. Cough is: mostly dry Sputum production: minimal, but thick when occurs.  Medications tried: cough syrups, musinex, tylenol  Taking blood pressure medications: yes, a ACEi.  Symptoms Runny nose: yes2 Mucous in back of throat: yes Throat burning or reflux: yes Wheezing or asthma: no Fever: no Chest Pain: no Shortness of breath: no Leg swelling: no Hemoptysis: no Weight loss: no  ROS see HPI Smoking Status noted  Objective: BP 137/37 mmHg  Pulse 77  Temp(Src) 98.5 F (36.9 C) (Oral)  Wt 141 lb (63.957 kg)  SpO2 96% Gen: NAD, alert, cooperative, and pleasant. HEENT: NCAT, EOMI, PERRL, OP clear, neck FROM, no LAD CV: RRR, no murmur Resp: CTAB, no wheezes, non-labored Ext: No edema, warm   Assessment and plan:  Cough Patient is here with reports of a cough for the past 2 weeks. Etiology currently unknown however signs and symptoms most consistent with likely viral pulmonary infection. Patient has been afebrile but states that she has had regular rhinorrhea and her cough is occasionally productive. She denies any significant shortness of breath but states that her cough has been occasionally waking her up at night. She denies any chest pain, orthopnea, or dyspnea on exertion. And no peripheral edema noted on exam making cardiac cause less likely. - Offered patient a chest x-ray today, patient stated that she is not feeling this was necessary so I withheld disorder. - I encouraged her to continue her current regimen for symptomatic relief. (Tylenol, Mucinex, hot steamy showers) - I encouraged her to use her incentive spirometer that she reports having at home from her previous admission for pneumonia. - I asked that she come back in 2 weeks if her symptoms persist or  sooner if they worsen.  Next: If patient's symptoms persist over the next ~2 weeks some consideration should be made to possibly change her ACEi to an ARB.    Elberta Leatherwood, MD,MS,  PGY2 07/05/2015 6:02 PM

## 2015-07-05 NOTE — Patient Instructions (Signed)
Cough Treatment - you should: - Take over-the-counter Tylenol as directed on the bottles for fever, pain, and/or inflammation. - Continue taking the over-the-counter Mucinex as needed for congestion. -  Over-the-counter nasal saline spray may also help with much of the nasal irritation/congestion you may have.  You should be better in: 5-7 days Call us if you have severe shortness of breath, chest pain, high fever or are not better in 2 weeks

## 2015-07-06 ENCOUNTER — Other Ambulatory Visit: Payer: Self-pay | Admitting: Family Medicine

## 2015-07-11 ENCOUNTER — Emergency Department (INDEPENDENT_AMBULATORY_CARE_PROVIDER_SITE_OTHER): Payer: Commercial Managed Care - HMO

## 2015-07-11 ENCOUNTER — Encounter (HOSPITAL_COMMUNITY): Payer: Self-pay

## 2015-07-11 ENCOUNTER — Emergency Department (INDEPENDENT_AMBULATORY_CARE_PROVIDER_SITE_OTHER)
Admission: EM | Admit: 2015-07-11 | Discharge: 2015-07-11 | Disposition: A | Discharge status: Home / Self Care | Payer: Commercial Managed Care - HMO | Source: Home / Self Care | Attending: Family Medicine | Admitting: Family Medicine

## 2015-07-11 DIAGNOSIS — S40011A Contusion of right shoulder, initial encounter: Secondary | ICD-10-CM

## 2015-07-11 DIAGNOSIS — W1839XA Other fall on same level, initial encounter: Secondary | ICD-10-CM

## 2015-07-11 DIAGNOSIS — S46001A Unspecified injury of muscle(s) and tendon(s) of the rotator cuff of right shoulder, initial encounter: Secondary | ICD-10-CM

## 2015-07-11 DIAGNOSIS — W010XXA Fall on same level from slipping, tripping and stumbling without subsequent striking against object, initial encounter: Secondary | ICD-10-CM

## 2015-07-11 NOTE — Discharge Instructions (Signed)
Contusion A contusion is a deep bruise. Contusions are the result of a blunt injury to tissues and muscle fibers under the skin. The injury causes bleeding under the skin. The skin overlying the contusion may turn blue, purple, or yellow. Minor injuries will give you a painless contusion, but more severe contusions may stay painful and swollen for a few weeks.  CAUSES  This condition is usually caused by a blow, trauma, or direct force to an area of the body. SYMPTOMS  Symptoms of this condition include:  Swelling of the injured area.  Pain and tenderness in the injured area.  Discoloration. The area may have redness and then turn blue, purple, or yellow. DIAGNOSIS  This condition is diagnosed based on a physical exam and medical history. An X-ray, CT scan, or MRI may be needed to determine if there are any associated injuries, such as broken bones (fractures). TREATMENT  Specific treatment for this condition depends on what area of the body was injured. In general, the best treatment for a contusion is resting, icing, applying pressure to (compression), and elevating the injured area. This is often called the RICE strategy. Over-the-counter anti-inflammatory medicines may also be recommended for pain control.  HOME CARE INSTRUCTIONS   Rest the injured area.  If directed, apply ice to the injured area:  Put ice in a plastic bag.  Place a towel between your skin and the bag.  Leave the ice on for 20 minutes, 2-3 times per day.  If directed, apply light compression to the injured area using an elastic bandage. Make sure the bandage is not wrapped too tightly. Remove and reapply the bandage as directed by your health care provider.  If possible, raise (elevate) the injured area above the level of your heart while you are sitting or lying down.  Take over-the-counter and prescription medicines only as told by your health care provider. SEEK MEDICAL CARE IF:  Your symptoms do not  improve after several days of treatment.  Your symptoms get worse.  You have difficulty moving the injured area. SEEK IMMEDIATE MEDICAL CARE IF:   You have severe pain.  You have numbness in a hand or foot.  Your hand or foot turns pale or cold.   This information is not intended to replace advice given to you by your health care provider. Make sure you discuss any questions you have with your health care provider.   Document Released: 02/28/2005 Document Revised: 02/09/2015 Document Reviewed: 10/06/2014 Elsevier Interactive Patient Education 2016 [place] Cuff Injury Rotator cuff injury is any type of injury to the set of muscles and tendons that make up the stabilizing unit of your shoulder. This unit holds the ball of your upper arm bone (humerus) in the socket of your shoulder blade (scapula).  CAUSES Injuries to your rotator cuff most commonly come from sports or activities that cause your arm to be moved repeatedly over your head. Examples of this include throwing, weight lifting, swimming, or racquet sports. Long lasting (chronic) irritation of your rotator cuff can cause soreness and swelling (inflammation), bursitis, and eventual damage to your tendons, such as a tear (rupture). SIGNS AND SYMPTOMS Acute rotator cuff tear:  Sudden tearing sensation followed by severe pain shooting from your upper shoulder down your arm toward your elbow.  Decreased range of motion of your shoulder because of pain and muscle spasm.  Severe pain.  Inability to raise your arm out to the side because of pain and loss of  muscle power (large tears). Chronic rotator cuff tear:  Pain that usually is worse at night and may interfere with sleep.  Gradual weakness and decreased shoulder motion as the pain worsens.  Decreased range of motion. Rotator cuff tendinitis:  Deep ache in your shoulder and the outside upper arm over your shoulder.  Pain that comes on gradually and  becomes worse when lifting your arm to the side or turning it inward. DIAGNOSIS Rotator cuff injury is diagnosed through a medical history, physical exam, and imaging exam. The medical history helps determine the type of rotator cuff injury. Your health care provider will look at your injured shoulder, feel the injured area, and ask you to move your shoulder in different positions. X-ray exams typically are done to rule out other causes of shoulder pain, such as fractures. MRI is the exam of choice for the most severe shoulder injuries because the images show muscles and tendons.  TREATMENT  Chronic tear:  Medicine for pain, such as acetaminophen or ibuprofen.  Physical therapy and range-of-motion exercises may be helpful in maintaining shoulder function and strength.  Steroid injections into your shoulder joint.  Surgical repair of the rotator cuff if the injury does not heal with noninvasive treatment. Acute tear:  Anti-inflammatory medicines such as ibuprofen and naproxen to help reduce pain and swelling.  A sling to help support your arm and rest your rotator cuff muscles. Long-term use of a sling is not advised. It may cause significant stiffening of the shoulder joint.  Surgery may be considered within a few weeks, especially in younger, active people, to return the shoulder to full function.  Indications for surgical treatment include the following:  Age younger than 41 years.  Rotator cuff tears that are complete.  Physical therapy, rest, and anti-inflammatory medicines have been used for 6-8 weeks, with no improvement.  Employment or sporting activity that requires constant shoulder use. Tendinitis:  Anti-inflammatory medicines such as ibuprofen and naproxen to help reduce pain and swelling.  A sling to help support your arm and rest your rotator cuff muscles. Long-term use of a sling is not advised. It may cause significant stiffening of the shoulder joint.  Severe  tendinitis may require:  Steroid injections into your shoulder joint.  Physical therapy.  Surgery. HOME CARE INSTRUCTIONS   Apply ice to your injury:  Put ice in a plastic bag.  Place a towel between your skin and the bag.  Leave the ice on for 20 minutes, 2-3 times a day.  If you have a shoulder immobilizer (sling and straps), wear it until told otherwise by your health care provider.  You may want to sleep on several pillows or in a recliner at night to lessen swelling and pain.  Only take over-the-counter or prescription medicines for pain, discomfort, or fever as directed by your health care provider.  Do simple hand squeezing exercises with a soft rubber ball to decrease hand swelling. SEEK MEDICAL CARE IF:   Your shoulder pain increases, or new pain or numbness develops in your arm, hand, or fingers.  Your hand or fingers are colder than your other hand. SEEK IMMEDIATE MEDICAL CARE IF:   Your arm, hand, or fingers are numb or tingling.  Your arm, hand, or fingers are increasingly swollen and painful, or they turn white or blue. MAKE SURE YOU:  Understand these instructions.  Will watch your condition.  Will get help right away if you are not doing well or get worse.   This  information is not intended to replace advice given to you by your health care provider. Make sure you discuss any questions you have with your health care provider.   Document Released: 05/18/2000 Document Revised: 05/26/2013 Document Reviewed: 12/31/2012 Elsevier Interactive Patient Education [employer].

## 2015-07-11 NOTE — ED Provider Notes (Signed)
CSN: [id number]     Arrival date & time 07/11/15  1923 History   None    Chief Complaint  Patient presents with  . Shoulder Pain   (Consider location/radiation/quality/duration/timing/severity/associated sxs/prior Treatment) HPI Comments: This female was descending the steps and tripped over the last one and fell onto her right shoulder. She is complaining of pain over the shoulder joint. This occurred approximately 6:30 this afternoon. She states she is unable to lift the arm without assistance from the left arm. Denies injury elsewhere. No injury to the head or neck or other extremities.   Past Medical History  Diagnosis Date  . Irregular heart beat   . Depression   . Stroke [facility]) 2006  . Cancer ([place])     skin  . Carotid stenosis     s/p R CEA 2006  . Cataract     s/p surgery 2007  . Memory loss    Past Surgical History  Procedure Laterality Date  . Coronary angioplasty    . Basal cell carcinoma excision  02/18/2003  . Carotid endarterectomy  05/04/2005    R stenosis >80% on 03/16/2005  . Glaucoma surgery  06/25/2005  . Cataract extraction     Family History  Problem Relation Age of Onset  . Cancer Mother     Breast  . Aneurysm Father   . Cancer Sister     lung cancer  . Cancer Daughter     colon cancer  . Diabetes Maternal Grandmother   . Cancer Maternal Grandfather     lung  . Cancer Paternal Grandmother     mouth    Social History  Substance Use Topics  . Smoking status: Never Smoker   . Smokeless tobacco: Never Used  . Alcohol Use: No   OB History    Gravida Para Term Preterm AB TAB SAB Ectopic Multiple Living   7 6 6  1  1         Review of Systems  Constitutional: Positive for activity change. Negative for fever.  HENT: Negative.   Respiratory: Negative.   Cardiovascular: Negative for chest pain.  Gastrointestinal: Negative.   Musculoskeletal: Negative for gait problem, neck pain and neck stiffness.       As per history of present illness   Skin: Negative.   Neurological: Negative.     Allergies  Codeine; Neosporin; Petroleum jelly; Caine-1; Clindamycin/lincomycin; Isothiazolinone chloride; Neomycin sulfate; Phenylene; and Quaternium-15  Home Medications   Prior to Admission medications   Medication Sig Start Date End Date Taking? Authorizing Provider  aspirin 325 MG tablet Take 1 tablet (325 mg total) by mouth daily. 06/05/15   Hillary Corinda Gubler, MD  b complex vitamins tablet Take 1 tablet by mouth daily.    Historical Provider, MD  bimatoprost (LUMIGAN) 0.03 % ophthalmic drops Place 1 drop into both eyes at bedtime.      Historical Provider, MD  Coenzyme Q10 (CO Q-10) 100 MG CAPS Take by mouth.    Historical Provider, MD  donepezil (ARICEPT) 5 MG tablet TAKE 1 TABLET (5 MG TOTAL) BY MOUTH AT BEDTIME. 06/21/15   Pieter Partridge, DO  enalapril (VASOTEC) 2.5 MG tablet TAKE 1 TABLET BY MOUTH EVERY DAY 05/16/15   Vivi Barrack, MD  Glucos-MSM-C-Mn-Ginger-Willow (MSM GLUCOSAMINE COMPLEX PO) Take by mouth.    Historical Provider, MD  Incontinence Supplies MISC Use as directed 04/16/14   Vivi Barrack, MD  Incontinence Supply Disposable (REALITY INCONTINENT BRIEFS SM) MISC Use as needed. 04/12/14  Vivi Barrack, MD  JANUVIA 100 MG tablet TAKE 1 TABLET BY MOUTH EVERY DAY 07/04/15   Vivi Barrack, MD  metFORMIN (GLUCOPHAGE) 850 MG tablet TAKE 1 TABLET (850 MG TOTAL) BY MOUTH 2 (TWO) TIMES DAILY WITH A MEAL. 08/19/14   Vivi Barrack, MD  Multiple Vitamins-Minerals (MULTIVITAMIN WITH MINERALS) tablet Take 1 tablet by mouth every evening.     Historical Provider, MD  Nutritional Supplements (GLUCERNA ADVANCE SHAKE) LIQD Take 1 Bottle by mouth daily. 06/11/15   Hillary Corinda Gubler, MD  Omega-3 Fatty Acids (FISH OIL) 1000 MG CAPS Take 1 capsule by mouth daily.    Historical Provider, MD  QUEtiapine (SEROQUEL) 25 MG tablet Take 1-2 tab at bed time 06/29/15   Kathlee Nations, MD  rOPINIRole (REQUIP) 1 MG tablet Take 3 tablets (3 mg  total) by mouth at bedtime. 07/05/15   Vivi Barrack, MD  sertraline (ZOLOFT) 100 MG tablet Take 1 and 1/2 tab daily 06/29/15   Kathlee Nations, MD  simvastatin (ZOCOR) 40 MG tablet Take 1 tablet (40 mg total) by mouth at bedtime. 08/19/14   Vivi Barrack, MD   Meds Ordered and Administered this Visit  Medications - No data to display  BP 152/66 mmHg  Pulse 91  Temp(Src) 98 F (36.7 C) (Oral)  Resp 16  SpO2 98% No data found.   Physical Exam  Constitutional: She is oriented to person, place, and time. She appears well-developed and well-nourished. No distress.  Eyes: Conjunctivae and EOM are normal.  Neck: Normal range of motion. Neck supple.  Cardiovascular: Normal rate, regular rhythm and normal heart sounds.   Pulmonary/Chest: Effort normal. No respiratory distress.  Musculoskeletal:  Tenderness to the right shoulder joint. No apparent asymmetry compared to the left shoulder. Mild tenderness to the proximal humerus. No appreciable swelling. Distal neurovascular and motor sensory is intact. Radial pulse 2+.  Neurological: She is alert and oriented to person, place, and time. She exhibits normal muscle tone.  Skin: Skin is warm and dry.  Psychiatric: She has a normal mood and affect.  Nursing note and vitals reviewed.   ED Course  Procedures (including critical care time)  Labs Review Labs Reviewed - No data to display  Imaging Review No results found.   Visual Acuity Review  Right Eye Distance:   Left Eye Distance:   Bilateral Distance:    Right Eye Near:   Left Eye Near:    Bilateral Near:         MDM   1. Fall from slip, trip, or stumble, initial encounter   2. Shoulder contusion, right, initial encounter   3. Rotator cuff injury, right, initial encounter    Ice Arm sling F.U Dr. Veverly Fells, orhto, call for appt tomorrow. Tylenol q 4h prn and motrin 400 q 6h prn. Pt unable to take opioids or related.    Janne Napoleon, NP 07/11/15 2132

## 2015-07-11 NOTE — ED Notes (Signed)
Patient states she was walking through the yard earlier this evening tripped Landing on her right shoulder Having trouble lifting her right arm

## 2015-07-12 ENCOUNTER — Ambulatory Visit (INDEPENDENT_AMBULATORY_CARE_PROVIDER_SITE_OTHER): Payer: Commercial Managed Care - HMO | Admitting: Family Medicine

## 2015-07-12 ENCOUNTER — Encounter: Payer: Self-pay | Admitting: Family Medicine

## 2015-07-12 DIAGNOSIS — M25511 Pain in right shoulder: Secondary | ICD-10-CM | POA: Diagnosis not present

## 2015-07-12 NOTE — Progress Notes (Signed)
   Subjective:    Patient ID: Brittany Chung, female    DOB: Oct 12, 1940, 75 y.o.   MRN: [id number]  Seen for Same day visit for   CC: right shoulder pain  She reports falling after tripping and landing on her right shoulder.  She denies any dizziness, chest pain, palpitations prior to the fall or loss of consciousness after the fall.  She did not have any right shoulder pain immediately, but upon driving home noticed right lateral shoulder pain in difficulty using arm change gears.  She was able to continue using her arm, however the pain progressively got worse which prompted a visit to urgent care yesterday.  She had x-rays that do not show any fracture, dislocation.  She was given a right arm sling and referred to orthopedist;  However, she reports the orthopedist was in [place].  Patient requests referral to local orthopedist.  He denies any right arm numbness or tingling sensation.  Denies any neck or head pain.  Pain is somewhat improved today, but she is unable to use right arm due to the pain.   Review of Systems   See HPI for ROS. Objective:  BP 98/43 mmHg  Pulse 73  Temp(Src) 98.1 F (36.7 C) (Oral)  Wt 142 lb (64.411 kg)  General: NAD Right Shoulder:  Lateral deltoid ecchymosis.  No swelling.  Diffuse tenderness to palpation along anterior, lateral and posterior shoulder.  Active range of motion limited by pain,  And she is only able to abduct her arm to 10.  Passive range of motion to 90 also limited by pain.  Full internal/external rotation.  Sensation in right extremity intact.  Grip strength and elbow flexion and extension 5 out of 5.     Assessment & Plan:   Right shoulder pain  Right shoulder pain status post fall.  History of right shoulder arthritis.  Some improvement in pain today but continues to have weakness with abduction that is limited by pain.  Unable to fully evaluate for rotator cuff tear or pathology given limitation in movement due to pain.  -  Referred  to orthopedics  Given possible rotator cuff or deltoid strain or tear -  Continue ibuprofen, Tylenol as needed for pain -  Continue sling as needed for comfort

## 2015-07-12 NOTE — Assessment & Plan Note (Signed)
Right shoulder pain status post fall.  History of right shoulder arthritis.  Some improvement in pain today but continues to have weakness with abduction that is limited by pain.  Unable to fully evaluate for rotator cuff tear or pathology given limitation in movement due to pain.  -  Referred to orthopedics  Given possible rotator cuff or deltoid strain or tear -  Continue ibuprofen, Tylenol as needed for pain -  Continue sling as needed for comfort

## 2015-07-14 ENCOUNTER — Ambulatory Visit (HOSPITAL_COMMUNITY): Payer: Self-pay | Admitting: Psychiatry

## 2015-07-14 ENCOUNTER — Telehealth: Payer: Self-pay | Admitting: Family Medicine

## 2015-07-14 NOTE — Telephone Encounter (Signed)
done

## 2015-07-14 NOTE — Telephone Encounter (Signed)
pts daughter states [facility] needs the NPI number faxed to them and the number of visits approved and the expiration of the visits. Fax #  [phone] atten: sammy

## 2015-07-18 ENCOUNTER — Telehealth: Payer: Self-pay | Admitting: *Deleted

## 2015-07-18 NOTE — Telephone Encounter (Signed)
Will forward to referral coordinator to see if we can change patient's appt to different office. Brookie Wayment,CMA

## 2015-07-18 NOTE — Telephone Encounter (Signed)
Pts daughter called, she is not pleased with [facility], she is requesting to be sent to [facility] instead.  She feels that [facility] is just to busy to get her mom in with an appropriate timeframe.  Advised I would forward to MD. Clinton Sawyer, Salome Spotted

## 2015-07-20 NOTE — Telephone Encounter (Signed)
Pt has appt on 07/21/15 at 11am with Dr. Alfonso Ramus at [facility]. Pt's daughter is aware of appt.

## 2015-07-22 ENCOUNTER — Ambulatory Visit (INDEPENDENT_AMBULATORY_CARE_PROVIDER_SITE_OTHER): Payer: Commercial Managed Care - HMO | Admitting: Psychiatry

## 2015-07-22 ENCOUNTER — Encounter (HOSPITAL_COMMUNITY): Payer: Self-pay | Admitting: Psychiatry

## 2015-07-22 VITALS — BP 127/73 | HR 73 | Ht 63.5 in | Wt 148.0 lb

## 2015-07-22 DIAGNOSIS — F310 Bipolar disorder, current episode hypomanic: Secondary | ICD-10-CM | POA: Diagnosis not present

## 2015-07-22 MED ORDER — SERTRALINE HCL 100 MG PO TABS: ORAL_TABLET | ORAL | Status: DC

## 2015-07-22 MED ORDER — QUETIAPINE FUMARATE 50 MG PO TABS: ORAL_TABLET | ORAL | Status: DC

## 2015-07-22 NOTE — Progress Notes (Signed)
[place] Progress Note  Brittany Chung [id number] 75 y.o.  07/22/2015 12:12 PM  Chief Complaint:  I am sleeping better.               History of Present Illness:  Serai came for her followup appointment with her daughter.  On her last visit we started Seroquel.  She is taking 50 mg every night.  She sleeping better.  She still have a lot of anxiety nervousness and depression.  She is concerned about her son who is now in prison .  Patient told he cannot 3-1/2 years because of child pornography .  Patient admitted it is very shameful and she is very sad about the situation.  She admitted a lot of tension in the family but she is feeling better since Seroquel added at night.  We also increase Zoloft and she is taking 150 mg daily.  She has no side effects.  She denies any tremors shakes or any EPS.  Recently she was seen in the emergency room because of shoulder pain.  She still have shoulder pain and scheduled to have MRI next week.  She scheduled to see therapist next month for counseling.  Her daughter endorse less tearful and less confused but again memory impairment and some time lack of motivation and feeling isolated.  Her crying spells are better.  Her agitation and irritability is also improved from the past.  Patient denies drinking or using any illegal substances.  Her appetite is okay however she gained weight from the past.  She is no longer taking Ativan and Vyvanse.  Suicidal Ideation: No Plan Formed: No Patient has means to carry out plan: No  Homicidal Ideation: No Plan Formed: No Patient has means to carry out plan: No  Medical History; Patient has history of irregular heartbeat, stroke, cardiac stenosis, hypertension and diabetes mellitus .  She goes to [facility].      Past Psychiatric History/Hospitalization(s) Patient denies any history of suicidal attempt, inpatient psychiatric treatment of any psychosis.  As per daughter she has  history of bipolar disorder but she feels embarrassed to accept it.  She was given Seroquel by her primary care physician but she stopped taking it because of sedation.   She was taking Remeron which was stopped when Seroquel was started a few months ago. Anxiety: Yes Bipolar Disorder: Yes Depression: Yes Mania: No Psychosis: No Schizophrenia: No Personality Disorder: No Hospitalization for psychiatric illness: No History of Electroconvulsive Shock Therapy: No Prior Suicide Attempts: No   Review of Systems  Eyes: Negative for blurred vision.  Cardiovascular: Negative for palpitations.  Musculoskeletal: Positive for joint pain.  Skin: Negative for itching and rash.  Neurological: Negative for dizziness and tremors.  Psychiatric/Behavioral: The patient is nervous/anxious.     Psychiatric: Agitation: No Hallucination: No Depressed Mood: No Insomnia: No Hypersomnia: No Altered Concentration: No Feels Worthless: No Grandiose Ideas: No Belief In Special Powers: No New/Increased Substance Abuse: No Compulsions: No  Neurologic: Headache: No Seizure: No Paresthesias: No    Outpatient Encounter Prescriptions as of 07/22/2015  Medication Sig  . aspirin 325 MG tablet Take 1 tablet (325 mg total) by mouth daily.  Marland Kitchen b complex vitamins tablet Take 1 tablet by mouth daily.  . bimatoprost (LUMIGAN) 0.03 % ophthalmic drops Place 1 drop into both eyes at bedtime.    . Coenzyme Q10 (CO Q-10) 100 MG CAPS Take by mouth.  . donepezil (ARICEPT) 5 MG tablet TAKE 1  TABLET (5 MG TOTAL) BY MOUTH AT BEDTIME.  . enalapril (VASOTEC) 2.5 MG tablet TAKE 1 TABLET BY MOUTH EVERY DAY  . Glucos-MSM-C-Mn-Ginger-Willow (MSM GLUCOSAMINE COMPLEX PO) Take by mouth.  . Incontinence Supplies MISC Use as directed  . Incontinence Supply Disposable (REALITY INCONTINENT BRIEFS SM) MISC Use as needed.  Marland Kitchen JANUVIA 100 MG tablet TAKE 1 TABLET BY MOUTH EVERY DAY  . metFORMIN (GLUCOPHAGE) 850 MG tablet TAKE 1 TABLET  (850 MG TOTAL) BY MOUTH 2 (TWO) TIMES DAILY WITH A MEAL.  . Multiple Vitamins-Minerals (MULTIVITAMIN WITH MINERALS) tablet Take 1 tablet by mouth every evening.   . Nutritional Supplements (GLUCERNA ADVANCE SHAKE) LIQD Take 1 Bottle by mouth daily.  . Omega-3 Fatty Acids (FISH OIL) 1000 MG CAPS Take 1 capsule by mouth daily.  . QUEtiapine (SEROQUEL) 50 MG tablet Take 1 tab at bed time  . rOPINIRole (REQUIP) 1 MG tablet Take 3 tablets (3 mg total) by mouth at bedtime.  . sertraline (ZOLOFT) 100 MG tablet Take 1 and 1/2 tab daily  . simvastatin (ZOCOR) 40 MG tablet Take 1 tablet (40 mg total) by mouth at bedtime.  . [DISCONTINUED] QUEtiapine (SEROQUEL) 25 MG tablet Take 1-2 tab at bed time  . [DISCONTINUED] sertraline (ZOLOFT) 100 MG tablet Take 1 and 1/2 tab daily   No facility-administered encounter medications on file as of 07/22/2015.    Recent Results (from the past 2160 hour(s))  CBG monitoring, ED     Status: Abnormal   Collection Time: 06/03/15  1:42 PM  Result Value Ref Range   Glucose-Capillary 139 (H) 65 - 99 mg/dL  Urinalysis, Routine w reflex microscopic     Status: Abnormal   Collection Time: 06/03/15  1:55 PM  Result Value Ref Range   Color, Urine YELLOW YELLOW   APPearance CLEAR CLEAR   Specific Gravity, Urine 1.007 1.005 - 1.030   pH 6.5 5.0 - 8.0   Glucose, UA NEGATIVE NEGATIVE mg/dL   Hgb urine dipstick TRACE (A) NEGATIVE   Bilirubin Urine NEGATIVE NEGATIVE   Ketones, ur NEGATIVE NEGATIVE mg/dL   Protein, ur NEGATIVE NEGATIVE mg/dL   Nitrite NEGATIVE NEGATIVE   Leukocytes, UA NEGATIVE NEGATIVE  Urine microscopic-add on     Status: Abnormal   Collection Time: 06/03/15  1:55 PM  Result Value Ref Range   Squamous Epithelial / LPF 0-5 (A) NONE SEEN   WBC, UA 0-5 0 - 5 WBC/hpf   RBC / HPF 0-5 0 - 5 RBC/hpf   Bacteria, UA RARE (A) NONE SEEN  Basic metabolic panel     Status: Abnormal   Collection Time: 06/03/15  3:23 PM  Result Value Ref Range   Sodium 145 135 -  145 mmol/L   Potassium 5.0 3.5 - 5.1 mmol/L   Chloride 112 (H) 101 - 111 mmol/L   CO2 26 22 - 32 mmol/L   Glucose, Bld 122 (H) 65 - 99 mg/dL   BUN 18 6 - 20 mg/dL   Creatinine, Ser 1.12 (H) 0.44 - 1.00 mg/dL   Calcium 9.5 8.9 - 10.3 mg/dL   GFR calc non Af Amer 46 (L) >60 mL/min   GFR calc Af Amer 54 (L) >60 mL/min    Comment: (NOTE) The eGFR has been calculated using the CKD EPI equation. This calculation has not been validated in all clinical situations. eGFR's persistently <60 mL/min signify possible Chronic Kidney Disease.    Anion gap 7 5 - 15  CBC with Differential     Status:  Abnormal   Collection Time: 06/03/15  3:23 PM  Result Value Ref Range   WBC 7.7 4.0 - 10.5 K/uL   RBC 3.75 (L) 3.87 - 5.11 MIL/uL   Hemoglobin 10.8 (L) 12.0 - 15.0 g/dL   HCT 33.3 (L) 36.0 - 46.0 %   MCV 88.8 78.0 - 100.0 fL   MCH 28.8 26.0 - 34.0 pg   MCHC 32.4 30.0 - 36.0 g/dL   RDW 13.7 11.5 - 15.5 %   Platelets 173 150 - 400 K/uL   Neutrophils Relative % 36 %   Neutro Abs 2.8 1.7 - 7.7 K/uL   Lymphocytes Relative 51 %   Lymphs Abs 4.0 0.7 - 4.0 K/uL   Monocytes Relative 8 %   Monocytes Absolute 0.6 0.1 - 1.0 K/uL   Eosinophils Relative 4 %   Eosinophils Absolute 0.3 0.0 - 0.7 K/uL   Basophils Relative 1 %   Basophils Absolute 0.0 0.0 - 0.1 K/uL  Hemoglobin A1c     Status: Abnormal   Collection Time: 06/04/15  3:39 AM  Result Value Ref Range   Hgb A1c MFr Bld 6.8 (H) 4.8 - 5.6 %    Comment: (NOTE)         Pre-diabetes: 5.7 - 6.4         Diabetes: >6.4         Glycemic control for adults with diabetes: <7.0    Mean Plasma Glucose 148 mg/dL    Comment: (NOTE) Performed At: [facility] [place], [place] [id number] Lindon Romp MD [id number]   Lipid panel     Status: None   Collection Time: 06/04/15  3:39 AM  Result Value Ref Range   Cholesterol 136 0 - 200 mg/dL   Triglycerides 94 <150 mg/dL   HDL 60 >40 mg/dL   Total CHOL/HDL Ratio 2.3 RATIO    VLDL 19 0 - 40 mg/dL   LDL Cholesterol 57 0 - 99 mg/dL    Comment:        Total Cholesterol/HDL:CHD Risk Coronary Heart Disease Risk Table                     Men   Women  1/2 Average Risk   3.4   3.3  Average Risk       5.0   4.4  2 X Average Risk   9.6   7.1  3 X Average Risk  23.4   11.0        Use the calculated Patient Ratio above and the CHD Risk Table to determine the patient's CHD Risk.        ATP III CLASSIFICATION (LDL):  <100     mg/dL   Optimal  100-129  mg/dL   Near or Above                    Optimal  130-159  mg/dL   Borderline  160-189  mg/dL   High  >190     mg/dL   Very High   Glucose, capillary     Status: Abnormal   Collection Time: 06/04/15  8:33 AM  Result Value Ref Range   Glucose-Capillary 124 (H) 65 - 99 mg/dL  Glucose, capillary     Status: Abnormal   Collection Time: 06/04/15 11:26 AM  Result Value Ref Range   Glucose-Capillary 144 (H) 65 - 99 mg/dL  Basic metabolic panel     Status: Abnormal   Collection Time:  06/04/15 11:38 AM  Result Value Ref Range   Sodium 143 135 - 145 mmol/L   Potassium 4.2 3.5 - 5.1 mmol/L   Chloride 110 101 - 111 mmol/L   CO2 24 22 - 32 mmol/L   Glucose, Bld 140 (H) 65 - 99 mg/dL   BUN 16 6 - 20 mg/dL   Creatinine, Ser 1.27 (H) 0.44 - 1.00 mg/dL   Calcium 9.2 8.9 - 10.3 mg/dL   GFR calc non Af Amer 40 (L) >60 mL/min   GFR calc Af Amer 46 (L) >60 mL/min    Comment: (NOTE) The eGFR has been calculated using the CKD EPI equation. This calculation has not been validated in all clinical situations. eGFR's persistently <60 mL/min signify possible Chronic Kidney Disease.    Anion gap 9 5 - 15  Glucose, capillary     Status: Abnormal   Collection Time: 06/04/15  4:29 PM  Result Value Ref Range   Glucose-Capillary 123 (H) 65 - 99 mg/dL  Glucose, capillary     Status: Abnormal   Collection Time: 06/04/15  9:40 PM  Result Value Ref Range   Glucose-Capillary 112 (H) 65 - 99 mg/dL   Comment 1 Notify RN    Comment 2  Document in Chart   CBC     Status: Abnormal   Collection Time: 06/05/15 12:52 AM  Result Value Ref Range   WBC 9.4 4.0 - 10.5 K/uL   RBC 4.05 3.87 - 5.11 MIL/uL   Hemoglobin 11.5 (L) 12.0 - 15.0 g/dL   HCT 35.3 (L) 36.0 - 46.0 %   MCV 87.2 78.0 - 100.0 fL   MCH 28.4 26.0 - 34.0 pg   MCHC 32.6 30.0 - 36.0 g/dL   RDW 13.4 11.5 - 15.5 %   Platelets 215 150 - 400 K/uL  Basic metabolic panel     Status: Abnormal   Collection Time: 06/05/15 12:52 AM  Result Value Ref Range   Sodium 141 135 - 145 mmol/L   Potassium 4.0 3.5 - 5.1 mmol/L   Chloride 104 101 - 111 mmol/L   CO2 27 22 - 32 mmol/L   Glucose, Bld 122 (H) 65 - 99 mg/dL   BUN 19 6 - 20 mg/dL   Creatinine, Ser 1.34 (H) 0.44 - 1.00 mg/dL   Calcium 9.6 8.9 - 10.3 mg/dL   GFR calc non Af Amer 37 (L) >60 mL/min   GFR calc Af Amer 43 (L) >60 mL/min    Comment: (NOTE) The eGFR has been calculated using the CKD EPI equation. This calculation has not been validated in all clinical situations. eGFR's persistently <60 mL/min signify possible Chronic Kidney Disease.    Anion gap 10 5 - 15  Glucose, capillary     Status: Abnormal   Collection Time: 06/05/15  6:27 AM  Result Value Ref Range   Glucose-Capillary 144 (H) 65 - 99 mg/dL   Comment 1 Notify RN    Comment 2 Document in Chart   Glucose, capillary     Status: Abnormal   Collection Time: 06/05/15 11:24 AM  Result Value Ref Range   Glucose-Capillary 107 (H) 65 - 99 mg/dL  BASIC METABOLIC PANEL WITH GFR     Status: Abnormal   Collection Time: 06/10/15  4:14 PM  Result Value Ref Range   Sodium 138 135 - 146 mmol/L   Potassium 4.8 3.5 - 5.3 mmol/L   Chloride 104 98 - 110 mmol/L   CO2 27 20 - 31 mmol/L  Glucose, Bld 120 (H) 65 - 99 mg/dL   BUN 31 (H) 7 - 25 mg/dL   Creat 1.29 (H) 0.60 - 0.93 mg/dL   Calcium 9.5 8.6 - 10.4 mg/dL   GFR, Est African American 46 (L) >=60 mL/min   GFR, Est Non African American 40 (L) >=60 mL/min    Comment:   The estimated GFR is a  calculation valid for adults (>=72 years old) that uses the CKD-EPI algorithm to adjust for age and sex. It is   not to be used for children, pregnant women, hospitalized patients,    patients on dialysis, or with rapidly changing kidney function. According to the NKDEP, eGFR >89 is normal, 60-89 shows mild impairment, 30-59 shows moderate impairment, 15-29 shows severe impairment and <15 is ESRD.         Physical Exam: Constitutional:  BP 127/73 mmHg  Pulse 73  Ht 5' 3.5" (1.613 m)  Wt 148 lb (67.132 kg)  BMI 25.80 kg/m2  Musculoskeletal: Strength & Muscle Tone: within normal limits Gait & Station: normal Patient leans: Patient has a normal posture  Mental Status Examination;  Patient is groomed and well dressed female.  She maintained fair eye contact.  She described her mood anxious and her affect is appropriate. Her speech is fast but clear and coherent. Her attention concentration is fair.  She denies any auditory or visual hallucination.  She denies any active or passive suicidal thoughts or homicidal thoughts.  There were no paranoia or any delusions present at this time.  Her fund of knowledge is average.  Her thought process is circumstantial.  There were no flight of ideas or loose association.  Her psychomotor activity is increased. She is alert and oriented x3 however difficulty remembering things..  Her insight judgment and impulse control is okay.   Established Problem, Stable/Improving (1), Review of Psycho-Social Stressors (1), Review or order clinical lab tests (1), Review of Last Therapy Session (1) and Review of Medication Regimen & Side Effects (2)  Assessment: Axis I: ADD inattentive type, bipolar disorder type I, major depressive disorder recurrent mild, cognitive disorder NOS  Axis II: Deferred  Axis III:  Past Medical History  Diagnosis Date  . Irregular heart beat   . Depression   . Stroke [facility]) 2006  . Cancer ([place])     skin  . Carotid stenosis      s/p R CEA 2006  . Cataract     s/p surgery 2007  . Memory loss    Plan:  I reviewed records from emergency room and primary care physician.  She is feeling better with increase Zoloft and since Seroquel was started.  She is taking 50 mg.  I will defer adjusting more medication since patient taking multiple medication and she has extensive cardiac and health issues.  Discuss in detail medication interaction and side effects.  Patient also feels comfortable to stay on her current dose.  She scheduled to see orthopedic doctor for MRI for her shoulder pain.  I do encourage her to keep appointment with therapist for coping and social skills.  Discussed psychosocial issues related to her son.  Recommended to call us back if she has any question or any concern.  Follow-up in 2 months.  Arlene Brickel T., MD 07/22/2015

## 2015-08-03 ENCOUNTER — Emergency Department (HOSPITAL_COMMUNITY): Payer: Commercial Managed Care - HMO

## 2015-08-03 ENCOUNTER — Encounter (HOSPITAL_COMMUNITY): Payer: Self-pay | Admitting: *Deleted

## 2015-08-03 ENCOUNTER — Inpatient Hospital Stay (HOSPITAL_COMMUNITY)
Admission: EM | Admit: 2015-08-03 | Discharge: 2015-08-06 | DRG: 247 | Disposition: A | Discharge status: Home / Self Care | Payer: Commercial Managed Care - HMO | Attending: Family Medicine | Admitting: Family Medicine

## 2015-08-03 DIAGNOSIS — I5032 Chronic diastolic (congestive) heart failure: Secondary | ICD-10-CM | POA: Diagnosis present

## 2015-08-03 DIAGNOSIS — I2584 Coronary atherosclerosis due to calcified coronary lesion: Secondary | ICD-10-CM | POA: Diagnosis present

## 2015-08-03 DIAGNOSIS — I10 Essential (primary) hypertension: Secondary | ICD-10-CM | POA: Diagnosis present

## 2015-08-03 DIAGNOSIS — E785 Hyperlipidemia, unspecified: Secondary | ICD-10-CM | POA: Diagnosis present

## 2015-08-03 DIAGNOSIS — I2582 Chronic total occlusion of coronary artery: Secondary | ICD-10-CM | POA: Diagnosis present

## 2015-08-03 DIAGNOSIS — I214 Non-ST elevation (NSTEMI) myocardial infarction: Secondary | ICD-10-CM | POA: Diagnosis not present

## 2015-08-03 DIAGNOSIS — Z8673 Personal history of transient ischemic attack (TIA), and cerebral infarction without residual deficits: Secondary | ICD-10-CM

## 2015-08-03 DIAGNOSIS — R319 Hematuria, unspecified: Secondary | ICD-10-CM | POA: Diagnosis not present

## 2015-08-03 DIAGNOSIS — R079 Chest pain, unspecified: Secondary | ICD-10-CM | POA: Diagnosis present

## 2015-08-03 DIAGNOSIS — G2581 Restless legs syndrome: Secondary | ICD-10-CM | POA: Diagnosis present

## 2015-08-03 DIAGNOSIS — Z7982 Long term (current) use of aspirin: Secondary | ICD-10-CM

## 2015-08-03 DIAGNOSIS — Z85828 Personal history of other malignant neoplasm of skin: Secondary | ICD-10-CM

## 2015-08-03 DIAGNOSIS — E118 Type 2 diabetes mellitus with unspecified complications: Secondary | ICD-10-CM | POA: Diagnosis present

## 2015-08-03 DIAGNOSIS — F319 Bipolar disorder, unspecified: Secondary | ICD-10-CM | POA: Diagnosis present

## 2015-08-03 DIAGNOSIS — I5030 Unspecified diastolic (congestive) heart failure: Secondary | ICD-10-CM | POA: Diagnosis present

## 2015-08-03 DIAGNOSIS — F0390 Unspecified dementia without behavioral disturbance: Secondary | ICD-10-CM | POA: Diagnosis present

## 2015-08-03 DIAGNOSIS — M75101 Unspecified rotator cuff tear or rupture of right shoulder, not specified as traumatic: Secondary | ICD-10-CM | POA: Diagnosis present

## 2015-08-03 DIAGNOSIS — Z7984 Long term (current) use of oral hypoglycemic drugs: Secondary | ICD-10-CM

## 2015-08-03 DIAGNOSIS — N179 Acute kidney failure, unspecified: Secondary | ICD-10-CM | POA: Diagnosis present

## 2015-08-03 DIAGNOSIS — Z9861 Coronary angioplasty status: Secondary | ICD-10-CM

## 2015-08-03 DIAGNOSIS — H9190 Unspecified hearing loss, unspecified ear: Secondary | ICD-10-CM | POA: Diagnosis present

## 2015-08-03 DIAGNOSIS — I2510 Atherosclerotic heart disease of native coronary artery without angina pectoris: Secondary | ICD-10-CM | POA: Diagnosis present

## 2015-08-03 DIAGNOSIS — I110 Hypertensive heart disease with heart failure: Secondary | ICD-10-CM | POA: Diagnosis present

## 2015-08-03 DIAGNOSIS — I249 Acute ischemic heart disease, unspecified: Secondary | ICD-10-CM | POA: Diagnosis present

## 2015-08-03 DIAGNOSIS — Z9889 Other specified postprocedural states: Secondary | ICD-10-CM

## 2015-08-03 DIAGNOSIS — Z79899 Other long term (current) drug therapy: Secondary | ICD-10-CM

## 2015-08-03 HISTORY — DX: Essential (primary) hypertension: I10

## 2015-08-03 HISTORY — DX: Type 2 diabetes mellitus without complications: E11.9

## 2015-08-03 LAB — CBC WITH DIFFERENTIAL/PLATELET
Basophils Absolute: 0 10*3/uL (ref 0.0–0.1)
Basophils Relative: 0 %
EOS ABS: 0 10*3/uL (ref 0.0–0.7)
Eosinophils Relative: 0 %
HCT: 36.6 % (ref 36.0–46.0)
HEMOGLOBIN: 11.7 g/dL — AB (ref 12.0–15.0)
LYMPHS ABS: 1.9 10*3/uL (ref 0.7–4.0)
LYMPHS PCT: 27 %
MCH: 27.9 pg (ref 26.0–34.0)
MCHC: 32 g/dL (ref 30.0–36.0)
MCV: 87.1 fL (ref 78.0–100.0)
Monocytes Absolute: 0 10*3/uL — ABNORMAL LOW (ref 0.1–1.0)
Monocytes Relative: 0 %
NEUTROS PCT: 73 %
Neutro Abs: 4.9 10*3/uL (ref 1.7–7.7)
Platelets: 255 10*3/uL (ref 150–400)
RBC: 4.2 MIL/uL (ref 3.87–5.11)
RDW: 13.8 % (ref 11.5–15.5)
WBC: 6.8 10*3/uL (ref 4.0–10.5)

## 2015-08-03 LAB — COMPREHENSIVE METABOLIC PANEL
ALT: 20 U/L (ref 14–54)
AST: 28 U/L (ref 15–41)
Albumin: 4 g/dL (ref 3.5–5.0)
Alkaline Phosphatase: 98 U/L (ref 38–126)
Anion gap: 15 (ref 5–15)
BUN: 33 mg/dL — AB (ref 6–20)
CHLORIDE: 104 mmol/L (ref 101–111)
CO2: 21 mmol/L — AB (ref 22–32)
CREATININE: 1.45 mg/dL — AB (ref 0.44–1.00)
Calcium: 9.9 mg/dL (ref 8.9–10.3)
GFR calc Af Amer: 39 mL/min — ABNORMAL LOW (ref >60)
GFR, EST NON AFRICAN AMERICAN: 34 mL/min — AB (ref >60)
GLUCOSE: 299 mg/dL — AB (ref 65–99)
Potassium: 4.8 mmol/L (ref 3.5–5.1)
Sodium: 140 mmol/L (ref 135–145)
Total Bilirubin: 0.7 mg/dL (ref 0.3–1.2)
Total Protein: 7.5 g/dL (ref 6.5–8.1)

## 2015-08-03 LAB — TROPONIN I: TROPONIN I: 0.03 ng/mL (ref <0.031)

## 2015-08-03 MED ORDER — ACETAMINOPHEN 325 MG PO TABS
650.0000 mg | ORAL_TABLET | ORAL | Status: DC | PRN
  Administered 2015-08-04 – 2015-08-05 (×2): 650 mg via ORAL
  Filled 2015-08-03 (×2): qty 2

## 2015-08-03 MED ORDER — DONEPEZIL HCL 5 MG PO TABS
5.0000 mg | ORAL_TABLET | Freq: Every day | ORAL | Status: DC
  Administered 2015-08-04 – 2015-08-05 (×3): 5 mg via ORAL
  Filled 2015-08-03 (×3): qty 1

## 2015-08-03 MED ORDER — ASPIRIN 325 MG PO TABS
325.0000 mg | ORAL_TABLET | Freq: Every day | ORAL | Status: DC
  Administered 2015-08-04 – 2015-08-05 (×2): 325 mg via ORAL
  Filled 2015-08-03 (×2): qty 1

## 2015-08-03 MED ORDER — SIMVASTATIN 20 MG PO TABS
40.0000 mg | ORAL_TABLET | Freq: Every day | ORAL | Status: DC
  Administered 2015-08-04 (×2): 40 mg via ORAL
  Filled 2015-08-03 (×2): qty 1

## 2015-08-03 MED ORDER — SERTRALINE HCL 50 MG PO TABS
150.0000 mg | ORAL_TABLET | Freq: Every day | ORAL | Status: DC
  Administered 2015-08-04 – 2015-08-06 (×3): 150 mg via ORAL
  Filled 2015-08-03: qty 3
  Filled 2015-08-03: qty 1
  Filled 2015-08-03: qty 3
  Filled 2015-08-03: qty 1

## 2015-08-03 MED ORDER — QUETIAPINE FUMARATE 50 MG PO TABS
50.0000 mg | ORAL_TABLET | Freq: Every day | ORAL | Status: DC
  Administered 2015-08-04 – 2015-08-05 (×3): 50 mg via ORAL
  Filled 2015-08-03: qty 2
  Filled 2015-08-03: qty 1
  Filled 2015-08-03: qty 2

## 2015-08-03 MED ORDER — ROPINIROLE HCL 1 MG PO TABS
3.0000 mg | ORAL_TABLET | Freq: Every day | ORAL | Status: DC
  Administered 2015-08-04 – 2015-08-05 (×3): 3 mg via ORAL
  Filled 2015-08-03 (×3): qty 3

## 2015-08-03 MED ORDER — ENOXAPARIN SODIUM 30 MG/0.3ML ~~LOC~~ SOLN: 30.0000 mg | SUBCUTANEOUS | Status: DC

## 2015-08-03 MED ORDER — HYDRALAZINE HCL 20 MG/ML IJ SOLN: 5.0000 mg | Freq: Four times a day (QID) | INTRAMUSCULAR | Status: DC | PRN

## 2015-08-03 MED ORDER — INSULIN ASPART 100 UNIT/ML ~~LOC~~ SOLN
0.0000 [IU] | Freq: Three times a day (TID) | SUBCUTANEOUS | Status: DC
  Administered 2015-08-04 (×3): 2 [IU] via SUBCUTANEOUS
  Administered 2015-08-05 – 2015-08-06 (×2): 1 [IU] via SUBCUTANEOUS

## 2015-08-03 MED ORDER — INSULIN ASPART 100 UNIT/ML ~~LOC~~ SOLN: 0.0000 [IU] | Freq: Every day | SUBCUTANEOUS | Status: DC

## 2015-08-03 MED ORDER — ONDANSETRON HCL 4 MG/2ML IJ SOLN: 4.0000 mg | Freq: Four times a day (QID) | INTRAMUSCULAR | Status: DC | PRN

## 2015-08-03 MED ORDER — GI COCKTAIL ~~LOC~~: 30.0000 mL | Freq: Two times a day (BID) | ORAL | Status: DC | PRN

## 2015-08-03 NOTE — ED Provider Notes (Signed)
CSN: [id number]     Arrival date & time 08/03/15  1950 History   First MD Initiated Contact with Patient 08/03/15 2006     Chief Complaint  Patient presents with  . Chest Pain     (Consider location/radiation/quality/duration/timing/severity/associated sxs/prior Treatment) HPI Comments: 75 yo female patient with a history of DM, "mini strokes", high cholesterol, carotid stenosis (endarterectomy in 2006) presents with complaint of chest pain described as "burning pressure" that affects right and left chest radiating to left axilla and associated with diaphoresis. Pain started while at rest around 4:00 pm this afternoon and lasted about one hour before resolving. She has had 3 episodes since onset. No SOB, nausea or vomiting. No recent cough or fever. She states she has had previous cardiac catheterization 10 years ago without need for stenting. She denies seeing a cardiologist since that time. No current chest pain. She took aspirin prior to arrival.  Patient is a 75 y.o. female presenting with chest pain. The history is provided by the patient and a relative. No language interpreter was used.  Chest Pain Pain location:  L chest and R chest Pain quality: burning and pressure   Radiates to: Left axilla. Pain radiates to the back: no   Pain severity:  Mild Duration:  4 hours Timing:  Intermittent Chronicity:  New Relieved by:  Nothing Worsened by:  Nothing tried Associated symptoms: no abdominal pain, no fever, no nausea, no shortness of breath, not vomiting and no weakness     Past Medical History  Diagnosis Date  . Irregular heart beat   . Depression   . Stroke [facility]) 2006  . Cancer ([place])     skin  . Carotid stenosis     s/p R CEA 2006  . Cataract     s/p surgery 2007  . Memory loss    Past Surgical History  Procedure Laterality Date  . Coronary angioplasty    . Basal cell carcinoma excision  02/18/2003  . Carotid endarterectomy  05/04/2005    R stenosis >80% on 03/16/2005   . Glaucoma surgery  06/25/2005  . Cataract extraction     Family History  Problem Relation Age of Onset  . Cancer Mother     Breast  . Aneurysm Father   . Cancer Sister     lung cancer  . Cancer Daughter     colon cancer  . Diabetes Maternal Grandmother   . Cancer Maternal Grandfather     lung  . Cancer Paternal Grandmother     mouth    Social History  Substance Use Topics  . Smoking status: Never Smoker   . Smokeless tobacco: Never Used  . Alcohol Use: No   OB History    Gravida Para Term Preterm AB TAB SAB Ectopic Multiple Living   7 6 6  1  1         Review of Systems  Constitutional: Negative for fever and chills.  Respiratory: Negative.  Negative for shortness of breath.   Cardiovascular: Positive for chest pain.  Gastrointestinal: Negative.  Negative for nausea, vomiting and abdominal pain.  Musculoskeletal: Negative.  Negative for myalgias.  Skin: Negative.   Neurological: Negative.  Negative for syncope and weakness.      Allergies  Codeine; Neosporin; Petroleum jelly; Caine-1; Clindamycin/lincomycin; Isothiazolinone chloride; Neomycin sulfate; Phenylene; and Quaternium-15  Home Medications   Prior to Admission medications   Medication Sig Start Date End Date Taking? Authorizing Provider  metFORMIN (GLUCOPHAGE) 850 MG tablet TAKE 1  TABLET (850 MG TOTAL) BY MOUTH 2 (TWO) TIMES DAILY WITH A MEAL. 08/19/14  Yes Vivi Barrack, MD  aspirin 325 MG tablet Take 1 tablet (325 mg total) by mouth daily. 06/05/15   Hillary Corinda Gubler, MD  b complex vitamins tablet Take 1 tablet by mouth daily.    Historical Provider, MD  bimatoprost (LUMIGAN) 0.03 % ophthalmic drops Place 1 drop into both eyes at bedtime.      Historical Provider, MD  Coenzyme Q10 (CO Q-10) 100 MG CAPS Take by mouth.    Historical Provider, MD  donepezil (ARICEPT) 5 MG tablet TAKE 1 TABLET (5 MG TOTAL) BY MOUTH AT BEDTIME. 06/21/15   Pieter Partridge, DO  enalapril (VASOTEC) 2.5 MG tablet TAKE 1  TABLET BY MOUTH EVERY DAY 05/16/15   Vivi Barrack, MD  Glucos-MSM-C-Mn-Ginger-Willow (MSM GLUCOSAMINE COMPLEX PO) Take by mouth.    Historical Provider, MD  Incontinence Supplies MISC Use as directed 04/16/14   Vivi Barrack, MD  Incontinence Supply Disposable (REALITY INCONTINENT BRIEFS SM) MISC Use as needed. 04/12/14   Vivi Barrack, MD  JANUVIA 100 MG tablet TAKE 1 TABLET BY MOUTH EVERY DAY 07/04/15   Vivi Barrack, MD  Multiple Vitamins-Minerals (MULTIVITAMIN WITH MINERALS) tablet Take 1 tablet by mouth every evening.     Historical Provider, MD  Nutritional Supplements (GLUCERNA ADVANCE SHAKE) LIQD Take 1 Bottle by mouth daily. 06/11/15   Hillary Corinda Gubler, MD  Omega-3 Fatty Acids (FISH OIL) 1000 MG CAPS Take 1 capsule by mouth daily.    Historical Provider, MD  QUEtiapine (SEROQUEL) 50 MG tablet Take 1 tab at bed time 07/22/15   Kathlee Nations, MD  rOPINIRole (REQUIP) 1 MG tablet Take 3 tablets (3 mg total) by mouth at bedtime. 07/05/15   Vivi Barrack, MD  sertraline (ZOLOFT) 100 MG tablet Take 1 and 1/2 tab daily 07/22/15   Kathlee Nations, MD  simvastatin (ZOCOR) 40 MG tablet Take 1 tablet (40 mg total) by mouth at bedtime. 08/19/14   Vivi Barrack, MD   BP 161/65 mmHg  Pulse 93  Resp 16  SpO2 97% Physical Exam  Constitutional: She is oriented to person, place, and time. She appears well-developed and well-nourished.  HENT:  Head: Normocephalic.  Neck: Normal range of motion. Neck supple.  Cardiovascular: Normal rate and regular rhythm.   No murmur heard. No carotid bruit.  Pulmonary/Chest: Effort normal and breath sounds normal. She has no wheezes. She has no rales.  Abdominal: Soft. Bowel sounds are normal. There is no tenderness. There is no rebound and no guarding.  Musculoskeletal: Normal range of motion. She exhibits no edema.  Neurological: She is alert and oriented to person, place, and time.  Skin: Skin is warm and dry. No rash noted.  Psychiatric: She has a normal  mood and affect.    ED Course  Procedures (including critical care time) Labs Review Labs Reviewed  CBC WITH DIFFERENTIAL/PLATELET  TROPONIN I  COMPREHENSIVE METABOLIC PANEL   Results for orders placed or performed during the hospital encounter of 08/03/15  CBC with Differential  Result Value Ref Range   WBC 6.8 4.0 - 10.5 K/uL   RBC 4.20 3.87 - 5.11 MIL/uL   Hemoglobin 11.7 (L) 12.0 - 15.0 g/dL   HCT 36.6 36.0 - 46.0 %   MCV 87.1 78.0 - 100.0 fL   MCH 27.9 26.0 - 34.0 pg   MCHC 32.0 30.0 - 36.0 g/dL   RDW 13.8  11.5 - 15.5 %   Platelets 255 150 - 400 K/uL   Neutrophils Relative % 73 %   Neutro Abs 4.9 1.7 - 7.7 K/uL   Lymphocytes Relative 27 %   Lymphs Abs 1.9 0.7 - 4.0 K/uL   Monocytes Relative 0 %   Monocytes Absolute 0.0 (L) 0.1 - 1.0 K/uL   Eosinophils Relative 0 %   Eosinophils Absolute 0.0 0.0 - 0.7 K/uL   Basophils Relative 0 %   Basophils Absolute 0.0 0.0 - 0.1 K/uL  Troponin I  Result Value Ref Range   Troponin I 0.03 <0.031 ng/mL  Comprehensive metabolic panel  Result Value Ref Range   Sodium 140 135 - 145 mmol/L   Potassium 4.8 3.5 - 5.1 mmol/L   Chloride 104 101 - 111 mmol/L   CO2 21 (L) 22 - 32 mmol/L   Glucose, Bld 299 (H) 65 - 99 mg/dL   BUN 33 (H) 6 - 20 mg/dL   Creatinine, Ser 1.45 (H) 0.44 - 1.00 mg/dL   Calcium 9.9 8.9 - 10.3 mg/dL   Total Protein 7.5 6.5 - 8.1 g/dL   Albumin 4.0 3.5 - 5.0 g/dL   AST 28 15 - 41 U/L   ALT 20 14 - 54 U/L   Alkaline Phosphatase 98 38 - 126 U/L   Total Bilirubin 0.7 0.3 - 1.2 mg/dL   GFR calc non Af Amer 34 (L) >60 mL/min   GFR calc Af Amer 39 (L) >60 mL/min   Anion gap 15 5 - 15   Dg Shoulder Right  07/11/2015  CLINICAL DATA:  75 year old female with fall and right shoulder pain. EXAM: RIGHT SHOULDER - 2+ VIEW COMPARISON:  Radiograph dated 12/01/2014 FINDINGS: There is no acute fracture or dislocation. The bones are osteopenic. There is mild elevation of the right femur compatible with chronic rotator cuff  injury. There are moderate degenerative changes of the right acromioclavicular joint. Right apical scarring and surgical clip along the right side of the neck again noted. IMPRESSION: No acute fracture or dislocation. Electronically Signed   By: Anner Crete M.D.   On: 07/11/2015 21:11   Dg Chest Port 1 View  08/03/2015  CLINICAL DATA:  75 year old female with history of chest tightness today radiating from the right to the left side of the chest and the arms with some burning. Shortness of breath. EXAM: PORTABLE CHEST 1 VIEW COMPARISON:  Chest x-ray 07/08/2012. FINDINGS: Lung volumes are normal. No consolidative airspace disease. No pleural effusions. No pneumothorax. No pulmonary nodule or mass noted. Extensive bilateral apical pleural parenchymal thickening and calcifications, similar prior examinations, most compatible with chronic post infectious or inflammatory scarring. Pulmonary vasculature and the cardiomediastinal silhouette are within normal limits. Atherosclerosis in the thoracic aorta. IMPRESSION: 1.  No radiographic evidence of acute cardiopulmonary disease. 2. Atherosclerosis. Electronically Signed   By: Vinnie Langton M.D.   On: 08/03/2015 20:54    Imaging Review No results found. I have personally reviewed and evaluated these images and lab results as part of my medical decision-making.   EKG Interpretation   Date/Time:  Wednesday August 03 2015 20:11:19 EST Ventricular Rate:  94 PR Interval:  144 QRS Duration: 108 QT Interval:  387 QTC Calculation: 484 R Axis:   50 Text Interpretation:  Sinus rhythm Minimal ST depression, lateral leads No  significant change since last tracing Confirmed by Winfred Leeds  MD, SAM  [phone]) on 08/03/2015 8:29:38 PM      MDM   Final diagnoses:  None  1. Chest pain  The patient is having chest pain that is concerning in pattern for cardiac pain, has significant risk factors for ACS, including DM, CAD, high cholesterol. Currently chest  pain free, normal troponin, unchanged, non-acute EKG. Discussed with [place] regarding admission.     Charlann Lange, PA-C 08/03/15 [place], MD 08/04/15 [phone]

## 2015-08-03 NOTE — ED Provider Notes (Signed)
Complaint of anterior chest pain described as burning and pressure-like in nature and nonradiating onset 4:15 PM today. Pain lasted approximately 2.5 hours resolved after treatment with aspirin supplemental oxygen.. Brought by EMS. No associated shortness of breath nausea or sweatiness. Presently asymptomatic on exam alert no distress lungs clear auscultation heart regular rate and rhythm abdomen nondistended nontender. Heart score equals 5  Orlie Dakin, MD 08/03/15 2348

## 2015-08-03 NOTE — ED Notes (Signed)
Pt by ems for central CP radiating to left arm with some diaphoresis.  Resolved with 324mg  aspirin from ems.

## 2015-08-03 NOTE — H&P (Signed)
[facility] Admission History and Physical Service Pager: [phone]  Patient name: Brittany Chung Medical record number: [id number] Date of birth: 05/07/1941 Age: 75 y.o. Gender: female  Primary Care Provider: Dimas Chyle, MD Consultants: None Code Status: Full per discussion on admission  Chief Complaint:  Chest tightness  Assessment and Plan: Brittany Chung is a 75 y.o. female presenting with chest tightness and burning . PMH is significant for HTN, HFpEF, T2DM, HLD, and CVA s/p carotid R endarterectomy  Chest pain: DDx includes acid reflux, MSK, and less like ACS. Herat score is 4 for suspicion, age, and risk factors. EKG stable without ST elevation or depression. Initially troponin negative. Suspect acid reflux given the indigestion the patient notes with her pain.  - place in observation, attending Dr. Ardelia Mems.  - patient currently without discomfort, advised her to ask RN for GI cocktail at onset of chest discomfort to see if this resolves her symptoms. - trend troponins  - EKG in the AM  - continue home ASA 325mg - could consider transitioning to 81mg    Elevated creatinine: Scr 1.45 on admission, baseline around 1.2. Increase does not meet criteria for AKI however would hold off on any nephrotoxic agents.   HFpEF- Echo 2 months ago with EF 123456, grade 2 diastolic dysfunction. On enalapril 2.5mg  at home. Stable. - holding home enalapril given slight increase in creatinine.   HTN- BPs 170-140s/80-60s - holding home enalapril - hydralazine PRN   T2DM: On Januvia 100mg  daily and metformin 850mg  BID (patient only taking once daily due to GI discomfort). Last A1c was 6.8 on 06/04/15 however CBG elevated to 299 on admission.  - hold home medications - sensitive SSI with CBG checks  Hyperlipidemia: Labs 05/2015 at goal: HDL 60, LDL 57, triglycerides 94  - continue home simvastatin   Mild congnitive impairment/dementia  - continue Aricept    Depression/ Bipolar- stable - continue Seroquel 50mg  qHS, and Zoloft 150mg  daily.  Restless leg: Stable. - continue Requip 3mg  qHS  H/o stroke: No residual deficits.  Right rotator cuff tear: Had steroid injection today per report. Scheduled to have scope debridement, acromioplasty, clavicectomy, rotator cuff repair, and biceps tenodesis in May.  - consider tylenol or tramadol as needed for pain   FEN/GI:  Heart healthy/carb modified.  IV saline lock Prophylaxis: lovenox SQ  Disposition: place in observation on telemetry   History of Present Illness:  Brittany Chung is a 75 y.o. female presenting with chest pain that started this evening. The chest pain was burning from the right upper chest across to her left side under her axilla as well as a tightening sensation. She also felt "clammy."  This all lasted for approximately 45 minutes at rest (from 4-5pm). Since she's been in the ED, she's noted that the chest burning/tightness intermittently develops at rest that lasts for approximately 10 minutes. She's noted some indigestion associated with this as well.  No shortness of breath, nausea, or vomiting. No fevers or chills. She denies SOB, increased swelling, dizziness.    Of note, she's had 2 URIs in the last few month consisting of facial pain, headaches intermittently, sore throat, and cough, but none in the last week.   Review Of Systems: Per HPI with the following additions: none Otherwise the remainder of the systems were negative.  Patient Active Problem List   Diagnosis Date Noted  . Chest pain 08/03/2015  . Cough 07/05/2015  . Mood swings ([place]) 06/11/2015  . Fatigue 06/11/2015  .  Elevated serum creatinine 06/11/2015  . Diastolic HF (heart failure) ([place]) 06/05/2015  . Dysphagia   . Bipolar disorder, current episode mixed, moderate ([place])   . HLD (hyperlipidemia)   . History of stroke   . Slurred speech 06/03/2015  . TIA (transient ischemic attack) 06/03/2015  . URI  (upper respiratory infection) 04/15/2015  . Right shoulder pain 11/30/2014  . Healthcare maintenance 09/28/2014  . Incontinence in female 03/26/2014  . Hearing loss 03/26/2014  . Mild cognitive impairment 02/23/2014  . Driving safety issue 02/23/2014  . Legionella pneumonia ([place]) 02/09/2014  . Neck pain 01/05/2014  . Stuttering 10/21/2013  . S/P carotid endarterectomy 04/01/2013  . Restless leg syndrome 01/18/2012  . Vitamin D deficiency 01/18/2012  . Chronic narrow angle glaucoma 10/12/2011  . Right knee pain 09/20/2011  . Counseling regarding advanced directives 08/18/2010  . ADHD 03/16/2010  . BIPOLAR DISORDER UNSPECIFIED 06/22/2009  . HERPES, GENITAL NOS 02/24/2007  . Diabetes mellitus, type II 10/01/2006  . HYPERLIPIDEMIA 10/01/2006  . HYPERTENSION, BENIGN ESSENTIAL 10/01/2006  . Irritable bowel syndrome 09/19/2006    Past Medical History: Past Medical History  Diagnosis Date  . Irregular heart beat   . Depression   . Stroke [facility]) 2006  . Cancer ([place])     skin  . Carotid stenosis     s/p R CEA 2006  . Cataract     s/p surgery 2007  . Memory loss     Past Surgical History: Past Surgical History  Procedure Laterality Date  . Coronary angioplasty    . Basal cell carcinoma excision  02/18/2003  . Carotid endarterectomy  05/04/2005    R stenosis >80% on 03/16/2005  . Glaucoma surgery  06/25/2005  . Cataract extraction      Social History: Social History  Substance Use Topics  . Smoking status: Never Smoker   . Smokeless tobacco: Never Used  . Alcohol Use: No   Additional social history: denies drug use. Lives with daughter, stays with her other daughter's children during the day.  Please also refer to relevant sections of EMR.  Family History: Family History  Problem Relation Age of Onset  . Cancer Mother     Breast  . Aneurysm Father   . Cancer Sister     lung cancer  . Cancer Daughter     colon cancer  . Diabetes Maternal Grandmother   .  Cancer Maternal Grandfather     lung  . Cancer Paternal Grandmother     mouth     Allergies and Medications: Allergies  Allergen Reactions  . Codeine     REACTION: nausea/vomiting  . Neosporin [Neomycin-Bacitracin Zn-Polymyx]   . Petroleum Jelly [Petrolatum]   . Caine-1 [Lidocaine Hcl] Rash  . Clindamycin/Lincomycin Swelling  . Isothiazolinone Chloride Rash  . Neomycin Sulfate Rash  . Phenylene Rash    Phenylenediamine  . Quaternium-15 Rash   No current facility-administered medications on file prior to encounter.   Current Outpatient Prescriptions on File Prior to Encounter  Medication Sig Dispense Refill  . aspirin 325 MG tablet Take 1 tablet (325 mg total) by mouth daily. 30 tablet 0  . b complex vitamins tablet Take 1 tablet by mouth daily.    . Coenzyme Q10 (CO Q-10) 100 MG CAPS Take 1 capsule by mouth daily.     Marland Kitchen donepezil (ARICEPT) 5 MG tablet TAKE 1 TABLET (5 MG TOTAL) BY MOUTH AT BEDTIME. 30 tablet 3  . enalapril (VASOTEC) 2.5 MG tablet TAKE 1 TABLET BY  MOUTH EVERY DAY 90 tablet 3  . Glucos-MSM-C-Mn-Ginger-Willow (MSM GLUCOSAMINE COMPLEX PO) Take 1 tablet by mouth daily. Reported on 08/03/2015    . JANUVIA 100 MG tablet TAKE 1 TABLET BY MOUTH EVERY DAY 30 tablet 2  . metFORMIN (GLUCOPHAGE) 850 MG tablet TAKE 1 TABLET (850 MG TOTAL) BY MOUTH 2 (TWO) TIMES DAILY WITH A MEAL. 60 tablet 5  . Multiple Vitamins-Minerals (MULTIVITAMIN WITH MINERALS) tablet Take 1 tablet by mouth every evening.     . Nutritional Supplements (GLUCERNA ADVANCE SHAKE) LIQD Take 1 Bottle by mouth daily. 12 Bottle 6  . Omega-3 Fatty Acids (FISH OIL) 1000 MG CAPS Take 1 capsule by mouth daily.    . QUEtiapine (SEROQUEL) 50 MG tablet Take 1 tab at bed time (Patient taking differently: Take 50 mg by mouth at bedtime. Take 1 tab at bed time) 30 tablet 1  . rOPINIRole (REQUIP) 1 MG tablet Take 3 tablets (3 mg total) by mouth at bedtime. 180 tablet 3  . sertraline (ZOLOFT) 100 MG tablet Take 1 and 1/2 tab  daily (Patient taking differently: Take 150 mg by mouth daily. Take 1 and 1/2 tab daily) 45 tablet 1  . simvastatin (ZOCOR) 40 MG tablet Take 1 tablet (40 mg total) by mouth at bedtime. 90 tablet 3  . Incontinence Supplies MISC Use as directed 1 each prn  . Incontinence Supply Disposable (REALITY INCONTINENT BRIEFS SM) MISC Use as needed. 1 each prn  . [DISCONTINUED] rOPINIRole (REQUIP) 1 MG tablet Take 1 tablet (1 mg total) by mouth at bedtime. 30 tablet 3    Objective: BP 158/68 mmHg  Pulse 80  Temp(Src) 97.9 F (36.6 C) (Oral)  Resp 16  Ht 5\' 3"  (1.6 m)  Wt 143 lb 1.6 oz (64.91 kg)  BMI 25.36 kg/m2  SpO2 97% Exam: General: Lying in bed in NAD. Non-toxic Eyes: Conjunctivae non-injected.  ENTM: Moist mucous membranes. Oropharynx clear. No nasal discharge.  Neck: Supple, no LAD Cardiovascular: RRR. No murmurs, rubs, or gallops noted. Trace pitting edema noted. No JVD noted.  Respiratory: No increased WOB. CTAB without wheezing, rhonchi, or crackles noted. Abdomen: +BS, soft, non-distended, non-tender.  MSK: Normal bulk and noted. No gross deformities noted.  Skin: No rashes noted  Neuro: A&O x4. No gross neurologic deficits  Psych:  Appropriate mood and affect.    Labs and Imaging: CBC BMET   Recent Labs Lab 08/04/15 0005  WBC 7.4  HGB 11.4*  HCT 34.0*  PLT 231    Recent Labs Lab 08/03/15 2124 08/04/15 0005  NA 140  --   K 4.8  --   CL 104  --   CO2 21*  --   BUN 33*  --   CREATININE 1.45* 1.22*  GLUCOSE 299*  --   CALCIUM 9.9  --      EKG: NSR, HR 94, minimal ST depression (<64mm) in I, II, and V4 QTc 484 Echo 06/2015: EF 123456, grade 2 diastolic dysfunction  Dg Chest Port 1 View  08/03/2015  CLINICAL DATA:  76 year old female with history of chest tightness today radiating from the right to the left side of the chest and the arms with some burning. Shortness of breath. EXAM: PORTABLE CHEST 1 VIEW COMPARISON:  Chest x-ray 07/08/2012. FINDINGS: Lung volumes  are normal. No consolidative airspace disease. No pleural effusions. No pneumothorax. No pulmonary nodule or mass noted. Extensive bilateral apical pleural parenchymal thickening and calcifications, similar prior examinations, most compatible with chronic post infectious or inflammatory scarring.  Pulmonary vasculature and the cardiomediastinal silhouette are within normal limits. Atherosclerosis in the thoracic aorta. IMPRESSION: 1.  No radiographic evidence of acute cardiopulmonary disease. 2. Atherosclerosis. Electronically Signed   By: Vinnie Langton M.D.   On: 08/03/2015 20:54     Archie Patten, MD 08/04/2015, 1:45 AM PGY-2, [place] [facility] pager: [phone], text pages welcome

## 2015-08-03 NOTE — Progress Notes (Signed)
Pt arrived from ED with c/o c/p, pt arrived on unit denies any pain at this time, pt a/o, pt stated she had recent fall, education given about bed alarm and fall precautions, pt refused bed alarm, pt oriented to unit, call bell given, VSS, pt stable

## 2015-08-04 ENCOUNTER — Encounter (HOSPITAL_COMMUNITY): Payer: Self-pay | Admitting: Cardiology

## 2015-08-04 DIAGNOSIS — I209 Angina pectoris, unspecified: Secondary | ICD-10-CM | POA: Diagnosis not present

## 2015-08-04 DIAGNOSIS — I10 Essential (primary) hypertension: Secondary | ICD-10-CM | POA: Diagnosis present

## 2015-08-04 DIAGNOSIS — R079 Chest pain, unspecified: Secondary | ICD-10-CM | POA: Diagnosis not present

## 2015-08-04 DIAGNOSIS — E118 Type 2 diabetes mellitus with unspecified complications: Secondary | ICD-10-CM | POA: Diagnosis not present

## 2015-08-04 DIAGNOSIS — E785 Hyperlipidemia, unspecified: Secondary | ICD-10-CM | POA: Diagnosis not present

## 2015-08-04 DIAGNOSIS — I249 Acute ischemic heart disease, unspecified: Secondary | ICD-10-CM | POA: Diagnosis present

## 2015-08-04 DIAGNOSIS — I5032 Chronic diastolic (congestive) heart failure: Secondary | ICD-10-CM | POA: Diagnosis not present

## 2015-08-04 LAB — TROPONIN I
Troponin I: 0.33 ng/mL — ABNORMAL HIGH (ref <0.031)
Troponin I: 0.25 ng/mL — ABNORMAL HIGH (ref <0.031)
Troponin I: 0.29 ng/mL — ABNORMAL HIGH (ref <0.031)

## 2015-08-04 LAB — GLUCOSE, CAPILLARY
GLUCOSE-CAPILLARY: 167 mg/dL — AB (ref 65–99)
Glucose-Capillary: 120 mg/dL — ABNORMAL HIGH (ref 65–99)
Glucose-Capillary: 156 mg/dL — ABNORMAL HIGH (ref 65–99)
Glucose-Capillary: 167 mg/dL — ABNORMAL HIGH (ref 65–99)
Glucose-Capillary: 186 mg/dL — ABNORMAL HIGH (ref 65–99)

## 2015-08-04 LAB — CBC
HCT: 34 % — ABNORMAL LOW (ref 36.0–46.0)
HEMOGLOBIN: 11.4 g/dL — AB (ref 12.0–15.0)
MCH: 29.2 pg (ref 26.0–34.0)
MCHC: 33.5 g/dL (ref 30.0–36.0)
MCV: 87 fL (ref 78.0–100.0)
PLATELETS: 231 10*3/uL (ref 150–400)
RBC: 3.91 MIL/uL (ref 3.87–5.11)
RDW: 13.8 % (ref 11.5–15.5)
WBC: 7.4 10*3/uL (ref 4.0–10.5)

## 2015-08-04 LAB — HEPARIN LEVEL (UNFRACTIONATED): Heparin Unfractionated: 1.07 IU/mL — ABNORMAL HIGH (ref 0.30–0.70)

## 2015-08-04 LAB — CREATININE, SERUM
CREATININE: 1.22 mg/dL — AB (ref 0.44–1.00)
GFR calc Af Amer: 49 mL/min — ABNORMAL LOW (ref >60)
GFR, EST NON AFRICAN AMERICAN: 42 mL/min — AB (ref >60)

## 2015-08-04 MED ORDER — SODIUM CHLORIDE 0.9% FLUSH: 3.0000 mL | Freq: Two times a day (BID) | INTRAVENOUS | Status: DC

## 2015-08-04 MED ORDER — SODIUM CHLORIDE 0.9 % IV SOLN
250.0000 mL | INTRAVENOUS | Status: DC | PRN
Start: 2015-08-04 — End: 2015-08-05

## 2015-08-04 MED ORDER — ENOXAPARIN SODIUM 40 MG/0.4ML ~~LOC~~ SOLN
40.0000 mg | SUBCUTANEOUS | Status: DC
  Administered 2015-08-04: 40 mg via SUBCUTANEOUS
  Filled 2015-08-04: qty 0.4

## 2015-08-04 MED ORDER — SODIUM CHLORIDE 0.9% FLUSH: 3.0000 mL | INTRAVENOUS | Status: DC | PRN

## 2015-08-04 MED ORDER — SODIUM CHLORIDE 0.9 % WEIGHT BASED INFUSION
1.0000 mL/kg/h | INTRAVENOUS | Status: DC
  Administered 2015-08-04 – 2015-08-05 (×3): 1 mL/kg/h via INTRAVENOUS

## 2015-08-04 MED ORDER — METOPROLOL TARTRATE 12.5 MG HALF TABLET
12.5000 mg | ORAL_TABLET | Freq: Two times a day (BID) | ORAL | Status: DC
  Administered 2015-08-04 – 2015-08-06 (×5): 12.5 mg via ORAL
  Filled 2015-08-04 (×5): qty 1

## 2015-08-04 MED ORDER — HEPARIN (PORCINE) IN NACL 100-0.45 UNIT/ML-% IJ SOLN
650.0000 [IU]/h | INTRAMUSCULAR | Status: DC
  Administered 2015-08-04: 900 [IU]/h via INTRAVENOUS
  Filled 2015-08-04: qty 250

## 2015-08-04 NOTE — Progress Notes (Signed)
Per patient request, daughter updated on progress.  Hilbert Corrigan PA Pager: [phone]

## 2015-08-04 NOTE — Progress Notes (Signed)
ANTICOAGULATION CONSULT NOTE - Follow Up Consult  Pharmacy Consult for heparin Indication: chest pain/ACS  Allergies  Allergen Reactions  . Codeine     REACTION: nausea/vomiting  . Neosporin [Neomycin-Bacitracin Zn-Polymyx]   . Petroleum Jelly [Petrolatum]   . Caine-1 [Lidocaine Hcl] Rash  . Clindamycin/Lincomycin Swelling  . Isothiazolinone Chloride Rash  . Neomycin Sulfate Rash  . Phenylene Rash    Phenylenediamine  . Quaternium-15 Rash    Patient Measurements: Height: 5\' 3"  (160 cm) Weight: 141 lb 12.8 oz (64.32 kg) (scale c) IBW/kg (Calculated) : 52.4 Heparin Dosing Weight: 64 kg  Vital Signs: Temp: 98 F (36.7 C) (03/02 1347) Temp Source: Oral (03/02 1347) BP: 144/73 mmHg (03/02 1347) Pulse Rate: 77 (03/02 1347)  Labs:  Recent Labs  08/03/15 2124 08/04/15 0005 08/04/15 0709 08/04/15 1238 08/04/15 1848 08/04/15 1934  HGB 11.7* 11.4*  --   --   --   --   HCT 36.6 34.0*  --   --   --   --   PLT 255 231  --   --   --   --   HEPARINUNFRC  --   --   --   --   --  1.07*  CREATININE 1.45* 1.22*  --   --   --   --   TROPONINI 0.03  --  0.33* 0.25* 0.29*  --     Estimated Creatinine Clearance: 35.4 mL/min (by C-G formula based on Cr of 1.22).   Medications:  Scheduled:  . aspirin  325 mg Oral Daily  . donepezil  5 mg Oral QHS  . insulin aspart  0-5 Units Subcutaneous QHS  . insulin aspart  0-9 Units Subcutaneous TID WC  . metoprolol tartrate  12.5 mg Oral BID  . QUEtiapine  50 mg Oral QHS  . rOPINIRole  3 mg Oral QHS  . sertraline  150 mg Oral Daily  . simvastatin  40 mg Oral QHS  . sodium chloride flush  3 mL Intravenous Q12H   Infusions:  . sodium chloride 1 mL/kg/hr (08/04/15 1726)  . heparin 900 Units/hr (08/04/15 1158)    Assessment: 75 yo female with chest pain is currently on supratherapeutic heparin.  Heparin level is 1.07, but this may be falsely elevated since patient got a dose of lovenox 40 mg at 1001 today and her CrCl is ~35.  Goal  of Therapy:  Heparin level 0.3-0.7 units/ml Monitor platelets by anticoagulation protocol: Yes   Plan:  - Due to the possibility of falsely elevated heparin level, will only empirically reduce dose to 800 units/hr and reassess in 8hrs  Gawain Crombie, Tsz-Yin 08/04/2015,8:12 PM

## 2015-08-04 NOTE — Progress Notes (Signed)
ANTICOAGULATION CONSULT NOTE - Initial Consult  Pharmacy Consult for Heparin Indication: chest pain/ACS  Allergies  Allergen Reactions  . Codeine     REACTION: nausea/vomiting  . Neosporin [Neomycin-Bacitracin Zn-Polymyx]   . Petroleum Jelly [Petrolatum]   . Caine-1 [Lidocaine Hcl] Rash  . Clindamycin/Lincomycin Swelling  . Isothiazolinone Chloride Rash  . Neomycin Sulfate Rash  . Phenylene Rash    Phenylenediamine  . Quaternium-15 Rash    Patient Measurements: Height: 5\' 3"  (160 cm) Weight: 141 lb 12.8 oz (64.32 kg) (scale c) IBW/kg (Calculated) : 52.4 Heparin Dosing Weight: 64 kg  Vital Signs: Temp: 98.6 F (37 C) (03/02 0829) Temp Source: Oral (03/02 0829) BP: 120/55 mmHg (03/02 0829) Pulse Rate: 96 (03/02 0829)  Labs:  Recent Labs  08/03/15 2124 08/04/15 0005 08/04/15 0709  HGB 11.7* 11.4*  --   HCT 36.6 34.0*  --   PLT 255 231  --   CREATININE 1.45* 1.22*  --   TROPONINI 0.03  --  0.33*    Estimated Creatinine Clearance: 35.4 mL/min (by C-G formula based on Cr of 1.22).   Medical History: Past Medical History  Diagnosis Date  . Irregular heart beat   . Depression   . Stroke [facility]) 2006  . Cancer ([place])     skin  . Carotid stenosis     s/p R CEA 2006  . Cataract     s/p surgery 2007  . Memory loss   . Diabetes mellitus without complication ([place])   . Hypertension    Assessment:   75 yr old female to begin IV heparin for chest pain/ACS.    Enoxaparin for VTE prophylaxis ordered on admission and just received 40 mg SQ at 10am today. Now changing to IV heparin with troponin 0.03>0.33.   Goal of Therapy:  Heparin level 0.3-0.7 units/ml Monitor platelets by anticoagulation protocol: Yes   Plan:   Heparin drip to begin at 900 units/hr.  No bolus due to recent Enoxaparin dose.  Heparin level ~8 hrs after drip begins.  Daily heparin level and CBC while on heparin.  Arty Baumgartner, [place] Pager: [phone] 08/04/2015,11:23 AM

## 2015-08-04 NOTE — Consult Note (Addendum)
CARDIOLOGY CONSULT NOTE   Patient ID: Brittany Chung MRN: [id number] DOB/AGE: Jul 31, 1940 75 y.o.  Admit date: 08/03/2015  Primary Physician   Dimas Chyle, MD Primary Cardiologist: New Reason for Consultation: Chest pain, elevated troponin   HPI: Brittany Chung is a 75 year old female with a past medical history of CVA (no residual) s/p right carotid endarterectomy, HTN, DM, HLD and diastolic CHF (grade 2). On 08/03/15, she was in her usual state of health until she developed chest pain that she describes a a "burning" and pressure that radiated across her chest from right axilla to left axilla.  She states that the pain made her clutch her mid chest.  The pain began about 4pm and lasted for at least 2 hours.  It was no associated with activity.  She endorses being diaphoretic but does not remember feeling SOB.  Her EKG at that time showed ST depression in leads V4-V6 and poc troponin was 0.03. Since she has been in the hospital she has had one more episode of the same pain lasting about 10-15 minutes. Her troponin this morning was 0.33 and cardiology was consulted.    She is minimally active.  She does daily chores and attends choir at her church.  Does not get regular exercise. Of note, she has a right rotator cuff tear for which she has surgery scheduled on May 5th.  She is right hand dominant and cannot lift her arm more than 10 degrees currently.  Cardiac risk factors include DM, HLD. She is on a statin and last LDL was 57. She has never smoked, does not drink alcohol.  She states that she has seen Dr. Gwenlyn Found in the past for her carotid stenosis and says that she had a cardiac catheterization about 10 years ago but did not receive a PCI. Records not found.   She was recently in the hospital in January of 2017 for slurred speech. She was admitted for TIA vs. CVA work up.  CT and MRI were negative.  Carotid dopplers showed 1-39% ICA stenosis bilaterally. Echo was done and showed LVEF of 60-65%  and grade 2 diastolic dysfunction.  Her ASA was increased to 325mg  daily prior to discharge.    Past Medical History  Diagnosis Date  . Irregular heart beat   . Depression   . Stroke [facility]) 2006  . Cancer ([place])     skin  . Carotid stenosis     s/p R CEA 2006  . Cataract     s/p surgery 2007  . Memory loss   . Diabetes mellitus without complication ([place])   . Hypertension      Past Surgical History  Procedure Laterality Date  . Coronary angioplasty    . Basal cell carcinoma excision  02/18/2003  . Carotid endarterectomy  05/04/2005    R stenosis >80% on 03/16/2005  . Glaucoma surgery  06/25/2005  . Cataract extraction      Allergies  Allergen Reactions  . Codeine     REACTION: nausea/vomiting  . Neosporin [Neomycin-Bacitracin Zn-Polymyx]   . Petroleum Jelly [Petrolatum]   . Caine-1 [Lidocaine Hcl] Rash  . Clindamycin/Lincomycin Swelling  . Isothiazolinone Chloride Rash  . Neomycin Sulfate Rash  . Phenylene Rash    Phenylenediamine  . Quaternium-15 Rash    I have reviewed the patient's current medications . aspirin  325 mg Oral Daily  . donepezil  5 mg Oral QHS  . enoxaparin (LOVENOX) injection  40 mg Subcutaneous Q24H  .  insulin aspart  0-5 Units Subcutaneous QHS  . insulin aspart  0-9 Units Subcutaneous TID WC  . QUEtiapine  50 mg Oral QHS  . rOPINIRole  3 mg Oral QHS  . sertraline  150 mg Oral Daily  . simvastatin  40 mg Oral QHS     acetaminophen, gi cocktail, hydrALAZINE  Prior to Admission medications   Medication Sig  aspirin 325 MG tablet Take 1 tablet (325 mg total) by mouth daily.  b complex vitamins tablet Take 1 tablet by mouth daily.  Coenzyme Q10 (CO Q-10) 100 MG CAPS Take 1 capsule by mouth daily.   donepezil (ARICEPT) 5 MG tablet TAKE 1 TABLET (5 MG TOTAL) BY MOUTH AT BEDTIME.  enalapril (VASOTEC) 2.5 MG tablet TAKE 1 TABLET BY MOUTH EVERY DAY  Glucos-MSM-C-Mn-Ginger-Willow (MSM GLUCOSAMINE COMPLEX PO) Take 1 tablet by mouth daily. Reported  on 08/03/2015  JANUVIA 100 MG tablet TAKE 1 TABLET BY MOUTH EVERY DAY  metFORMIN (GLUCOPHAGE) 850 MG tablet TAKE 1 TABLET (850 MG TOTAL) BY MOUTH 2 (TWO) TIMES DAILY WITH A MEAL.  Multiple Vitamins-Minerals (MULTIVITAMIN WITH MINERALS) tablet Take 1 tablet by mouth every evening.   Nutritional Supplements (GLUCERNA ADVANCE SHAKE) LIQD Take 1 Bottle by mouth daily.  Omega-3 Fatty Acids (FISH OIL) 1000 MG CAPS Take 1 capsule by mouth daily.  QUEtiapine (SEROQUEL) 50 MG tablet Take 1 tab at bed time Patient taking differently: Take 50 mg by mouth at bedtime. Take 1 tab at bed time  rOPINIRole (REQUIP) 1 MG tablet Take 3 tablets (3 mg total) by mouth at bedtime.  sertraline (ZOLOFT) 100 MG tablet Take 1 and 1/2 tab daily Patient taking differently: Take 150 mg by mouth daily. Take 1 and 1/2 tab daily  simvastatin (ZOCOR) 40 MG tablet Take 1 tablet (40 mg total) by mouth at bedtime.  Incontinence Supplies MISC Use as directed  Incontinence Supply Disposable (REALITY INCONTINENT BRIEFS SM) MISC Use as needed.     Social History   Social History  . Marital Status: Married    Spouse Name: N/A  . Number of Children: N/A  . Years of Education: N/A   Occupational History  . Not on file.   Social History Main Topics  . Smoking status: Never Smoker   . Smokeless tobacco: Never Used  . Alcohol Use: No  . Drug Use: No  . Sexual Activity: Not Currently   Other Topics Concern  . Not on file   Social History Narrative   Lives with: daughter and  5 grandchildren   Hobbies: ballroom dance   Religion: [employer]             Family Status  Relation Status Death Age  . Mother Deceased 63  . Father Deceased 62  . Sister Deceased   . Daughter Deceased    Family History  Problem Relation Age of Onset  . Cancer Mother     Breast  . Aneurysm Father   . Cancer Sister     lung cancer  . Cancer Daughter     colon cancer  . Diabetes Maternal Grandmother   . Cancer Maternal Grandfather      lung  . Cancer Paternal Grandmother     mouth      ROS:  Full 14 point review of systems complete and found to be negative unless listed above.  Physical Exam: Blood pressure 120/55, pulse 96, temperature 98.6 F (37 C), temperature source Oral, resp. rate 20, height 5\' 3"  (1.6 m), weight  141 lb 12.8 oz (64.32 kg), SpO2 97 %.  General: Well developed, well nourished, female in no acute distress Head: Eyes PERRLA, No xanthomas.   Normocephalic and atraumatic, oropharynx without edema or exudate. Dentition: poor  Lungs: Fine crackles in bilateral bases.  Clear in upper lobes.  Heart: HRRR S1 S2, no rub/gallop. Pulses are 2+ extrem.   Neck: No carotid bruits. No lymphadenopathy.  No JVD. Abdomen: Bowel sounds present, abdomen soft and non-tender without masses or hernias noted. Msk:  No spine or cva tenderness. No weakness, no joint deformities or effusions. Extremities: No clubbing or cyanosis. No edema.  Neuro: Alert and oriented X 3. No focal deficits noted. Psych:  Good affect, responds appropriately Skin: No rashes or lesions noted.  Labs:   Lab Results  Component Value Date   WBC 7.4 08/04/2015   HGB 11.4* 08/04/2015   HCT 34.0* 08/04/2015   MCV 87.0 08/04/2015   PLT 231 08/04/2015      Recent Labs Lab 08/03/15 2124 08/04/15 0005  NA 140  --   K 4.8  --   CL 104  --   CO2 21*  --   BUN 33*  --   CREATININE 1.45* 1.22*  CALCIUM 9.9  --   PROT 7.5  --   BILITOT 0.7  --   ALKPHOS 98  --   ALT 20  --   AST 28  --   GLUCOSE 299*  --   ALBUMIN 4.0  --    No results found for: MG  Recent Labs  08/03/15 2124 08/04/15 0709  TROPONINI 0.03 0.33*    Lab Results  Component Value Date   CHOL 136 06/04/2015   HDL 60 06/04/2015   LDLCALC 57 06/04/2015   TRIG 94 06/04/2015    TSH  Date/Time Value Ref Range Status  01/18/2012 12:07 PM 0.963 0.350 - 4.500 uIU/mL Final   VITAMIN B-12  Date/Time Value Ref Range Status  01/18/2012 12:07 PM 612 211 - 911 pg/mL  Final   FERRITIN  Date/Time Value Ref Range Status  01/18/2012 12:07 PM 68 10 - 291 ng/mL Final    Echo:  - Left ventricle: The cavity size was normal. There was mild concentric hypertrophy. Systolic function was normal. The estimated ejection fraction was in the range of 60% to 65%. Wall motion was normal; there were no regional wall motion abnormalities. Features are consistent with a pseudonormal left ventricular filling pattern, with concomitant abnormal relaxation and increased filling pressure (grade 2 diastolic dysfunction). Doppler parameters are consistent with elevated ventricular end-diastolic filling pressure. - Aortic valve: Trileaflet; normal thickness leaflets. There was mild regurgitation. - Aortic root: The aortic root was normal in size. - Mitral valve: Structurally normal valve. There was mild regurgitation. - Left atrium: The atrium was normal in size. - Right ventricle: The cavity size was normal. Wall thickness was normal. Systolic function was normal. - Right atrium: The atrium was normal in size. - Tricuspid valve: There was mild regurgitation. - Pulmonic valve: There was mild regurgitation. - Pulmonary arteries: Systolic pressure was within the normal range. PA peak pressure: 31 mm Hg (S). - Inferior vena cava: The vessel was normal in size. - Pericardium, extracardiac: There was no pericardial effusion.  ECG: NSR with minimal ST depression   Radiology:  Dg Chest Port 1 View  08/03/2015  CLINICAL DATA:  75 year old female with history of chest tightness today radiating from the right to the left side of the chest and the arms  with some burning. Shortness of breath. EXAM: PORTABLE CHEST 1 VIEW COMPARISON:  Chest x-ray 07/08/2012. FINDINGS: Lung volumes are normal. No consolidative airspace disease. No pleural effusions. No pneumothorax. No pulmonary nodule or mass noted. Extensive bilateral apical pleural parenchymal thickening and  calcifications, similar prior examinations, most compatible with chronic post infectious or inflammatory scarring. Pulmonary vasculature and the cardiomediastinal silhouette are within normal limits. Atherosclerosis in the thoracic aorta. IMPRESSION: 1.  No radiographic evidence of acute cardiopulmonary disease. 2. Atherosclerosis. Electronically Signed   By: Vinnie Langton M.D.   On: 08/03/2015 20:54    ASSESSMENT AND PLAN:    Active Problems:   Chest pain   Type 2 diabetes mellitus with complication (HCC)   Hyperlipidemia   Essential hypertension  1. NSTEMI - No chest pain currently  - Will place on IV Heparin.  - Cycling troponins, first was 0.33 - Will need cardiac cath. She will likely need a BMS as she will need to have surgery in May of this year on her right shoulder to regain function of her dominant arm.  - Will need 12.5 BID metoprolol, as HR is 90-100.   - EF was recently checked and normal, she will need IV hydration pre cath for renal protection.   2. Hyperlipidemia - On statin, LDL within desirable range.   3. HTN - Will add Metoprolol - Avoid ACE/ARB as Cr is 1.22    Signed: Arbutus Leas, NP 08/04/2015 10:43 AM   Co-Sign MD The patient has been seen in conjunction with Jettie Booze, NP All aspects of care have been considered and discussed. The patient has been personally interviewed, examined, and all clinical data has been reviewed.   Sudden onset of chest pressure and burning across the precordium with initial episode lasting 40 minutes. Several subsequent recurrences at rest. Elevation in troponin I noted after initial normal value.  History of atherosclerosis with prior right carotid endarterectomy. Other risk factors include diabetes, hyperlipidemia, and hypertension.  The patient has acute coronary syndrome and will require coronary angiography with possible PCI.  Aspirin, IV heparin, add long-acting nitrates, and plan cath and possible for  tomorrow. The patient was counseled to undergo left heart catheterization, coronary angiography, and possible percutaneous coronary intervention with stent implantation. The procedural risks and benefits were discussed in detail. The risks discussed included death, stroke, myocardial infarction, life-threatening bleeding, limb ischemia, kidney injury, allergy, and possible emergency cardiac surgery. The risk of these significant complications were estimated to occur less than 1% of the time. After discussion, the patient has agreed to proceed.

## 2015-08-04 NOTE — Progress Notes (Signed)
Family Medicine Teaching Service Daily Progress Note Intern Pager: [phone]  Patient name: Brittany Chung Medical record number: [id number] Date of birth: 09/29/1940 Age: 75 y.o. Gender: female  Primary Care Provider: Dimas Chyle, MD Consultants: Cardiology Code Status: Full  Pt Overview and Major Events to Date:  Admitted 08/03/15  Assessment and Plan: Brittany Chung is a 76 y.o. female presenting with chest tightness and burning. PMH is significant for HTN, HFpEF, T2DM, HLD, and CVA s/p carotid R endarterectomy. Pain has resolved since admission. However, troponin trended up this morning.   Chest pain: DDx includes acid reflux, MSK, and less like ACS. Herat score is 4 for suspicion, age, and risk factors. EKG stable without ST elevation or depression. Initially troponin negative. Suspect acid reflux given the indigestion the patient notes with her pain.  - patient currently without discomfort, advised her to ask RN for GI cocktail at onset of chest discomfort to see if this resolves her symptoms. - trend troponins --> first was negative, second elevated at 0.33 - continue home ASA 325mg - could consider transitioning to 81mg   - Cardiology consulted, appreciated recommendations. Plan for catheterization. Heparin drip started.   Elevated creatinine: Scr 1.45 on admission, baseline around 1.2. Increase does not meet criteria for AKI however would hold off on any nephrotoxic agents.  - Follow with daily BMPs  HFpEF- Echo 2 months ago with EF [id number], grade 2 diastolic dysfunction. On enalapril 2.5mg  at home. Stable. - holding home enalapril given slight increase in creatinine.   HTN- BPs 170-140s/80-60s - holding home enalapril - hydralazine PRN   T2DM: On Januvia 100mg  daily and metformin 850mg  BID (patient only taking once daily due to GI discomfort). Last A1c was 6.8 on 06/04/15 however CBG elevated to 299 on admission. Has been "hiding" one of her metformin pills daily.    -  hold home medications - sensitive SSI with CBG checks  Hyperlipidemia: Labs 05/2015 at goal: HDL 60, LDL 57, triglycerides 94  - continue home simvastatin   Mild congnitive impairment/dementia  - continue Aricept   Depression/ Bipolar- stable - continue Seroquel 50mg  qHS, and Zoloft 150mg  daily.  Restless leg: Stable. - continue Requip 3mg  qHS  H/o stroke: No residual deficits.  Right rotator cuff tear: Had steroid injection 08/03/15 per report. Scheduled to have scope debridement, acromioplasty, clavicectomy, rotator cuff repair, and biceps tenodesis in May.  - consider tylenol or tramadol as needed for pain   FEN/GI: Heart healthy/carb modified. IV saline lock Prophylaxis: lovenox SQ  Disposition: Admitted on telemetry    Subjective:  Ms. Brittany Chung had no further events of chest pain/tightness overnight. She was able to eat breakfast without issue.   Objective: Temp:  [97.9 F (36.6 C)-98.6 F (37 C)] 98.6 F (37 C) (03/02 0829) Pulse Rate:  [80-104] 96 (03/02 0829) Resp:  [16-24] 20 (03/02 0829) BP: (120-178)/(53-84) 120/55 mmHg (03/02 0829) SpO2:  [92 %-99 %] 97 % (03/02 0829) Weight:  [141 lb 12.8 oz (64.32 kg)-143 lb 1.6 oz (64.91 kg)] 141 lb 12.8 oz (64.32 kg) (03/02 0448) Physical Exam: General: Well-appearing older woman, resting in bed Cardiovascular: RRR, S1, S2, no m/r/g Chest: No chest wall tenderness. Bibasilar crackles but good air movement throughout.  Abdomen: +BS, S, NT, ND Extremities: WWP, good peripheral pulses  Laboratory:  Recent Labs Lab 08/03/15 2124 08/04/15 0005  WBC 6.8 7.4  HGB 11.7* 11.4*  HCT 36.6 34.0*  PLT 255 231    Recent Labs Lab 08/03/15 2124 08/04/15 0005  NA 140  --   K 4.8  --   CL 104  --   CO2 21*  --   BUN 33*  --   CREATININE 1.45* 1.22*  CALCIUM 9.9  --   PROT 7.5  --   BILITOT 0.7  --   ALKPHOS 98  --   ALT 20  --   AST 28  --   GLUCOSE 299*  --     Imaging/Diagnostic Tests: CXR negative for  acute cardiopulmonary disease  Rogue Bussing, MD 08/04/2015, 10:24 AM PGY-1, [place] Intern pager: [phone], text pages welcome

## 2015-08-05 ENCOUNTER — Other Ambulatory Visit: Payer: Self-pay | Admitting: Orthopedic Surgery

## 2015-08-05 ENCOUNTER — Encounter (HOSPITAL_COMMUNITY)
Admission: EM | Disposition: A | Discharge status: Home / Self Care | Payer: Self-pay | Source: Home / Self Care | Attending: Family Medicine

## 2015-08-05 ENCOUNTER — Encounter (HOSPITAL_COMMUNITY): Payer: Self-pay | Admitting: Cardiology

## 2015-08-05 DIAGNOSIS — I2584 Coronary atherosclerosis due to calcified coronary lesion: Secondary | ICD-10-CM | POA: Diagnosis present

## 2015-08-05 DIAGNOSIS — I249 Acute ischemic heart disease, unspecified: Secondary | ICD-10-CM | POA: Diagnosis not present

## 2015-08-05 DIAGNOSIS — F319 Bipolar disorder, unspecified: Secondary | ICD-10-CM | POA: Diagnosis present

## 2015-08-05 DIAGNOSIS — I2510 Atherosclerotic heart disease of native coronary artery without angina pectoris: Secondary | ICD-10-CM | POA: Diagnosis present

## 2015-08-05 DIAGNOSIS — M75101 Unspecified rotator cuff tear or rupture of right shoulder, not specified as traumatic: Secondary | ICD-10-CM | POA: Diagnosis present

## 2015-08-05 DIAGNOSIS — R079 Chest pain, unspecified: Secondary | ICD-10-CM | POA: Diagnosis present

## 2015-08-05 DIAGNOSIS — G2581 Restless legs syndrome: Secondary | ICD-10-CM | POA: Diagnosis present

## 2015-08-05 DIAGNOSIS — R319 Hematuria, unspecified: Secondary | ICD-10-CM | POA: Diagnosis not present

## 2015-08-05 DIAGNOSIS — H9190 Unspecified hearing loss, unspecified ear: Secondary | ICD-10-CM | POA: Diagnosis present

## 2015-08-05 DIAGNOSIS — I214 Non-ST elevation (NSTEMI) myocardial infarction: Secondary | ICD-10-CM | POA: Diagnosis not present

## 2015-08-05 DIAGNOSIS — Z85828 Personal history of other malignant neoplasm of skin: Secondary | ICD-10-CM | POA: Diagnosis not present

## 2015-08-05 DIAGNOSIS — E785 Hyperlipidemia, unspecified: Secondary | ICD-10-CM | POA: Diagnosis present

## 2015-08-05 DIAGNOSIS — I10 Essential (primary) hypertension: Secondary | ICD-10-CM | POA: Diagnosis not present

## 2015-08-05 DIAGNOSIS — F0390 Unspecified dementia without behavioral disturbance: Secondary | ICD-10-CM | POA: Diagnosis not present

## 2015-08-05 DIAGNOSIS — E118 Type 2 diabetes mellitus with unspecified complications: Secondary | ICD-10-CM | POA: Diagnosis present

## 2015-08-05 DIAGNOSIS — I5032 Chronic diastolic (congestive) heart failure: Secondary | ICD-10-CM

## 2015-08-05 DIAGNOSIS — I110 Hypertensive heart disease with heart failure: Secondary | ICD-10-CM | POA: Diagnosis present

## 2015-08-05 DIAGNOSIS — Z7984 Long term (current) use of oral hypoglycemic drugs: Secondary | ICD-10-CM | POA: Diagnosis not present

## 2015-08-05 DIAGNOSIS — N179 Acute kidney failure, unspecified: Secondary | ICD-10-CM | POA: Diagnosis not present

## 2015-08-05 DIAGNOSIS — I2582 Chronic total occlusion of coronary artery: Secondary | ICD-10-CM | POA: Diagnosis present

## 2015-08-05 DIAGNOSIS — Z7982 Long term (current) use of aspirin: Secondary | ICD-10-CM | POA: Diagnosis not present

## 2015-08-05 DIAGNOSIS — Z8673 Personal history of transient ischemic attack (TIA), and cerebral infarction without residual deficits: Secondary | ICD-10-CM | POA: Diagnosis not present

## 2015-08-05 DIAGNOSIS — Z9861 Coronary angioplasty status: Secondary | ICD-10-CM | POA: Diagnosis not present

## 2015-08-05 DIAGNOSIS — Z79899 Other long term (current) drug therapy: Secondary | ICD-10-CM | POA: Diagnosis not present

## 2015-08-05 HISTORY — PX: CARDIAC CATHETERIZATION: SHX172

## 2015-08-05 LAB — GLUCOSE, CAPILLARY
GLUCOSE-CAPILLARY: 121 mg/dL — AB (ref 65–99)
GLUCOSE-CAPILLARY: 118 mg/dL — AB (ref 65–99)
GLUCOSE-CAPILLARY: 143 mg/dL — AB (ref 65–99)
Glucose-Capillary: 122 mg/dL — ABNORMAL HIGH (ref 65–99)
Glucose-Capillary: 145 mg/dL — ABNORMAL HIGH (ref 65–99)

## 2015-08-05 LAB — COMPREHENSIVE METABOLIC PANEL
ALBUMIN: 3.7 g/dL (ref 3.5–5.0)
ALT: 20 U/L (ref 14–54)
ANION GAP: 10 (ref 5–15)
AST: 28 U/L (ref 15–41)
Alkaline Phosphatase: 73 U/L (ref 38–126)
BUN: 27 mg/dL — AB (ref 6–20)
CHLORIDE: 107 mmol/L (ref 101–111)
CO2: 23 mmol/L (ref 22–32)
Calcium: 9.3 mg/dL (ref 8.9–10.3)
Creatinine, Ser: 1.15 mg/dL — ABNORMAL HIGH (ref 0.44–1.00)
GFR calc Af Amer: 52 mL/min — ABNORMAL LOW (ref >60)
GFR, EST NON AFRICAN AMERICAN: 45 mL/min — AB (ref >60)
GLUCOSE: 165 mg/dL — AB (ref 65–99)
POTASSIUM: 4.8 mmol/L (ref 3.5–5.1)
Sodium: 140 mmol/L (ref 135–145)
Total Bilirubin: 0.5 mg/dL (ref 0.3–1.2)
Total Protein: 7.1 g/dL (ref 6.5–8.1)

## 2015-08-05 LAB — URINALYSIS, ROUTINE W REFLEX MICROSCOPIC
BILIRUBIN URINE: NEGATIVE
Glucose, UA: NEGATIVE mg/dL
Ketones, ur: 15 mg/dL — AB
NITRITE: NEGATIVE
Protein, ur: 30 mg/dL — AB
SPECIFIC GRAVITY, URINE: 1.031 — AB (ref 1.005–1.030)
pH: 5.5 (ref 5.0–8.0)

## 2015-08-05 LAB — POCT ACTIVATED CLOTTING TIME: Activated Clotting Time: 374 seconds

## 2015-08-05 LAB — URINE MICROSCOPIC-ADD ON

## 2015-08-05 LAB — PROTIME-INR
INR: 1.08 (ref 0.00–1.49)
PROTHROMBIN TIME: 14.2 s (ref 11.6–15.2)

## 2015-08-05 LAB — HEPARIN LEVEL (UNFRACTIONATED): HEPARIN UNFRACTIONATED: 0.89 [IU]/mL — AB (ref 0.30–0.70)

## 2015-08-05 SURGERY — LEFT HEART CATH AND CORONARY ANGIOGRAPHY
Laterality: Right

## 2015-08-05 MED ORDER — HEPARIN (PORCINE) IN NACL 2-0.9 UNIT/ML-% IJ SOLN
INTRAMUSCULAR | Status: AC
  Filled 2015-08-05: qty 1000

## 2015-08-05 MED ORDER — FENTANYL CITRATE (PF) 100 MCG/2ML IJ SOLN
INTRAMUSCULAR | Status: AC
  Filled 2015-08-05: qty 2

## 2015-08-05 MED ORDER — SODIUM CHLORIDE 0.9 % IV SOLN: 250.0000 mL | INTRAVENOUS | Status: DC | PRN

## 2015-08-05 MED ORDER — VERAPAMIL HCL 2.5 MG/ML IV SOLN
INTRAVENOUS | Status: AC
  Filled 2015-08-05: qty 2

## 2015-08-05 MED ORDER — SODIUM CHLORIDE 0.9% FLUSH
3.0000 mL | Freq: Two times a day (BID) | INTRAVENOUS | Status: DC
  Administered 2015-08-05: 3 mL via INTRAVENOUS

## 2015-08-05 MED ORDER — SODIUM CHLORIDE 0.9 % IV SOLN
250.0000 mg | INTRAVENOUS | Status: DC | PRN
  Administered 2015-08-05: 1.75 mg/kg/h via INTRAVENOUS

## 2015-08-05 MED ORDER — HEPARIN (PORCINE) IN NACL 2-0.9 UNIT/ML-% IJ SOLN
INTRAMUSCULAR | Status: DC | PRN
  Administered 2015-08-05: 1500 mL

## 2015-08-05 MED ORDER — FENTANYL CITRATE (PF) 100 MCG/2ML IJ SOLN
INTRAMUSCULAR | Status: DC | PRN
  Administered 2015-08-05 (×2): 12.5 ug via INTRAVENOUS
  Administered 2015-08-05 (×2): 25 ug via INTRAVENOUS

## 2015-08-05 MED ORDER — HEPARIN SODIUM (PORCINE) 1000 UNIT/ML IJ SOLN
INTRAMUSCULAR | Status: DC | PRN
  Administered 2015-08-05: 3500 [IU] via INTRAVENOUS

## 2015-08-05 MED ORDER — BIVALIRUDIN 250 MG IV SOLR
INTRAVENOUS | Status: AC
  Filled 2015-08-05: qty 250

## 2015-08-05 MED ORDER — ATORVASTATIN CALCIUM 80 MG PO TABS
80.0000 mg | ORAL_TABLET | Freq: Every day | ORAL | Status: DC
  Administered 2015-08-05: 18:00:00 80 mg via ORAL
  Filled 2015-08-05: qty 1

## 2015-08-05 MED ORDER — IOHEXOL 350 MG/ML SOLN
INTRAVENOUS | Status: DC | PRN
  Administered 2015-08-05: 170 mL via INTRA_ARTERIAL

## 2015-08-05 MED ORDER — ONDANSETRON HCL 4 MG/2ML IJ SOLN: 4.0000 mg | Freq: Four times a day (QID) | INTRAMUSCULAR | Status: DC | PRN

## 2015-08-05 MED ORDER — MIDAZOLAM HCL 2 MG/2ML IJ SOLN
INTRAMUSCULAR | Status: AC
  Filled 2015-08-05: qty 2

## 2015-08-05 MED ORDER — TICAGRELOR 90 MG PO TABS
ORAL_TABLET | ORAL | Status: DC | PRN
  Administered 2015-08-05: 180 mg via ORAL

## 2015-08-05 MED ORDER — VERAPAMIL HCL 2.5 MG/ML IV SOLN
INTRAVENOUS | Status: DC | PRN
  Administered 2015-08-05 (×2): 10 mL via INTRA_ARTERIAL

## 2015-08-05 MED ORDER — LIDOCAINE HCL (PF) 1 % IJ SOLN
INTRAMUSCULAR | Status: DC | PRN
  Administered 2015-08-05: 2 mL via INTRADERMAL

## 2015-08-05 MED ORDER — TICAGRELOR 90 MG PO TABS
ORAL_TABLET | ORAL | Status: AC
  Filled 2015-08-05: qty 2

## 2015-08-05 MED ORDER — BIVALIRUDIN BOLUS VIA INFUSION - CUPID
INTRAVENOUS | Status: DC | PRN
  Administered 2015-08-05: 48.075 mg via INTRAVENOUS

## 2015-08-05 MED ORDER — HEPARIN SODIUM (PORCINE) 1000 UNIT/ML IJ SOLN
INTRAMUSCULAR | Status: AC
  Filled 2015-08-05: qty 1

## 2015-08-05 MED ORDER — SODIUM CHLORIDE 0.9% FLUSH: 3.0000 mL | INTRAVENOUS | Status: DC | PRN

## 2015-08-05 MED ORDER — MIDAZOLAM HCL 2 MG/2ML IJ SOLN
INTRAMUSCULAR | Status: DC | PRN
  Administered 2015-08-05 (×2): 0.5 mg via INTRAVENOUS

## 2015-08-05 MED ORDER — LIDOCAINE HCL (PF) 1 % IJ SOLN
INTRAMUSCULAR | Status: AC
  Filled 2015-08-05: qty 30

## 2015-08-05 MED ORDER — HEPARIN (PORCINE) IN NACL 2-0.9 UNIT/ML-% IJ SOLN
INTRAMUSCULAR | Status: AC
  Filled 2015-08-05: qty 500

## 2015-08-05 MED ORDER — SODIUM CHLORIDE 0.9 % WEIGHT BASED INFUSION: 1.0000 mL/kg/h | INTRAVENOUS | Status: AC

## 2015-08-05 MED ORDER — TICAGRELOR 90 MG PO TABS
90.0000 mg | ORAL_TABLET | Freq: Two times a day (BID) | ORAL | Status: DC
  Administered 2015-08-05 – 2015-08-06 (×2): 90 mg via ORAL
  Filled 2015-08-05 (×2): qty 1

## 2015-08-05 MED ORDER — ASPIRIN 81 MG PO CHEW
81.0000 mg | CHEWABLE_TABLET | Freq: Every day | ORAL | Status: DC
Start: 2015-08-06 — End: 2015-08-06
  Administered 2015-08-06: 81 mg via ORAL
  Filled 2015-08-05: qty 1

## 2015-08-05 SURGICAL SUPPLY — 19 items
BALLN EUPHORA RX 2.0X12 (BALLOONS) ×3
BALLOON EUPHORA RX 2.0X12 (BALLOONS) ×2 IMPLANT
CATH INFINITI 5 FR JL3.5 (CATHETERS) ×3 IMPLANT
CATH INFINITI 5FR ANG PIGTAIL (CATHETERS) ×3 IMPLANT
CATH INFINITI JR4 5F (CATHETERS) ×3 IMPLANT
CATH VISTA GUIDE 6FR XB3 (CATHETERS) ×3 IMPLANT
CATH VISTA GUIDE 6FR XB3.5 (CATHETERS) ×3 IMPLANT
DEVICE RAD COMP TR BAND LRG (VASCULAR PRODUCTS) ×3 IMPLANT
GLIDESHEATH SLEND A-KIT 6F 22G (SHEATH) ×3 IMPLANT
KIT ENCORE 26 ADVANTAGE (KITS) ×3 IMPLANT
KIT HEART LEFT (KITS) ×3 IMPLANT
PACK CARDIAC CATHETERIZATION (CUSTOM PROCEDURE TRAY) ×3 IMPLANT
STENT SYNERGY DES 2.25X12 (Permanent Stent) ×3 IMPLANT
SYR MEDRAD MARK V 150ML (SYRINGE) ×3 IMPLANT
TRANSDUCER W/STOPCOCK (MISCELLANEOUS) ×3 IMPLANT
TUBING CIL FLEX 10 FLL-RA (TUBING) ×3 IMPLANT
WIRE HI TORQ BMW 190CM (WIRE) ×3 IMPLANT
WIRE HI TORQ VERSACORE-J 145CM (WIRE) ×3 IMPLANT
WIRE SAFE-T 1.5MM-J .035X260CM (WIRE) ×3 IMPLANT

## 2015-08-05 NOTE — Care Management Note (Signed)
Case Management Note  Patient Details  Name: MAHEK LASPISA MRN: [id number] Date of Birth: 11-18-1940  Subjective/Objective:    Patient is from home with daughter, pta indep.  She will be on [place] , awaiting benefit check, NCM gave her the 30 day trial savings card, She goes to [facility] on [address] NCM will check to see if they have in stock.   They have in stock.               Action/Plan:   Expected Discharge Date:                  Expected Discharge Plan:  Home/Self Care  In-House Referral:     Discharge planning Services  CM Consult  Post Acute Care Choice:    Choice offered to:     DME Arranged:    DME Agency:     HH Arranged:    [place] Agency:     Status of Service:  Completed, signed off  Medicare Important Message Given:    Date Medicare IM Given:    Medicare IM give by:    Date Additional Medicare IM Given:    Additional Medicare Important Message give by:     If discussed at [place] of Stay Meetings, dates discussed:    Additional Comments:  Zenon Mayo, RN 08/05/2015, 2:04 PM

## 2015-08-05 NOTE — Progress Notes (Signed)
Patient Name: Brittany Chung Date of Encounter: 08/05/2015   SUBJECTIVE  Feeling well. No chest pain, sob or palpitations.   CURRENT MEDS . aspirin  325 mg Oral Daily  . donepezil  5 mg Oral QHS  . insulin aspart  0-5 Units Subcutaneous QHS  . insulin aspart  0-9 Units Subcutaneous TID WC  . metoprolol tartrate  12.5 mg Oral BID  . QUEtiapine  50 mg Oral QHS  . rOPINIRole  3 mg Oral QHS  . sertraline  150 mg Oral Daily  . simvastatin  40 mg Oral QHS  . sodium chloride flush  3 mL Intravenous Q12H    OBJECTIVE  Filed Vitals:   08/04/15 2021 08/05/15 0000 08/05/15 0600 08/05/15 0900  BP: 141/61 120/49 155/56 145/58  Pulse: 76 69 71 68  Temp: 98.5 F (36.9 C) 98.5 F (36.9 C) 98.3 F (36.8 C) 97.3 F (36.3 C)  TempSrc: Oral Oral Oral Oral  Resp: 20 18 18 18   Height:      Weight:   141 lb 6.4 oz (64.139 kg)   SpO2: 98% 96% 95% 97%    Intake/Output Summary (Last 24 hours) at 08/05/15 1034 Last data filed at 08/05/15 0835  Gross per 24 hour  Intake 1286.64 ml  Output    150 ml  Net 1136.64 ml   Filed Weights   08/03/15 2319 08/04/15 0448 08/05/15 0600  Weight: 143 lb 1.6 oz (64.91 kg) 141 lb 12.8 oz (64.32 kg) 141 lb 6.4 oz (64.139 kg)    PHYSICAL EXAM  General: Pleasant, NAD. Neuro: Alert and oriented X 3. Moves all extremities spontaneously. Psych: Normal affect. HEENT:  Normal  Neck: Supple without bruits or JVD. Lungs:  Resp regular and unlabored, CTA. Heart: RRR no s3, s4, or murmurs. Abdomen: Soft, non-tender, non-distended, BS + x 4.  Extremities: No clubbing, cyanosis or edema. DP/PT/Radials 2+ and equal bilaterally.  Accessory Clinical Findings  CBC  Recent Labs  08/03/15 2124 08/04/15 0005  WBC 6.8 7.4  NEUTROABS 4.9  --   HGB 11.7* 11.4*  HCT 36.6 34.0*  MCV 87.1 87.0  PLT 255 [id number]   Basic Metabolic Panel  Recent Labs  08/03/15 2124 08/04/15 0005 08/05/15 0345  NA 140  --  140  K 4.8  --  4.8  CL 104  --  107  CO2 21*   --  23  GLUCOSE 299*  --  165*  BUN 33*  --  27*  CREATININE 1.45* 1.22* 1.15*  CALCIUM 9.9  --  9.3   Liver Function Tests  Recent Labs  08/03/15 2124 08/05/15 0345  AST 28 28  ALT 20 20  ALKPHOS 98 73  BILITOT 0.7 0.5  PROT 7.5 7.1  ALBUMIN 4.0 3.7   No results for input(s): LIPASE, AMYLASE in the last 72 hours. Cardiac Enzymes  Recent Labs  08/04/15 0709 08/04/15 1238 08/04/15 1848  TROPONINI 0.33* 0.25* 0.29*    TELE  Sinus rhythm  Radiology/Studies  Dg Shoulder Right  07/11/2015  CLINICAL DATA:  75 year old female with fall and right shoulder pain. EXAM: RIGHT SHOULDER - 2+ VIEW COMPARISON:  Radiograph dated 12/01/2014 FINDINGS: There is no acute fracture or dislocation. The bones are osteopenic. There is mild elevation of the right femur compatible with chronic rotator cuff injury. There are moderate degenerative changes of the right acromioclavicular joint. Right apical scarring and surgical clip along the right side of the neck again noted. IMPRESSION: No acute fracture or dislocation.  Electronically Signed   By: Anner Crete M.D.   On: 07/11/2015 21:11   Dg Chest Port 1 View  08/03/2015  CLINICAL DATA:  75 year old female with history of chest tightness today radiating from the right to the left side of the chest and the arms with some burning. Shortness of breath. EXAM: PORTABLE CHEST 1 VIEW COMPARISON:  Chest x-ray 07/08/2012. FINDINGS: Lung volumes are normal. No consolidative airspace disease. No pleural effusions. No pneumothorax. No pulmonary nodule or mass noted. Extensive bilateral apical pleural parenchymal thickening and calcifications, similar prior examinations, most compatible with chronic post infectious or inflammatory scarring. Pulmonary vasculature and the cardiomediastinal silhouette are within normal limits. Atherosclerosis in the thoracic aorta. IMPRESSION: 1.  No radiographic evidence of acute cardiopulmonary disease. 2. Atherosclerosis.  Electronically Signed   By: Vinnie Langton M.D.   On: 08/03/2015 20:54    ASSESSMENT AND PLAN  1. NSTEMI - No further chest pain.  - Will place on IV Heparin. Troponin 0.33-->0.25-->0.29.  - For cath today. She is schedule to have R should surgery for Rotator Cuff Tear in May. Consider BMS.?if patient is allergic to contrast dye (states that she had dizziness during prior cath about 10-15 years ago during/after stroke) unable to provide any further information. Seems "Flushing Manueuver" during R [facility] procedure note 05/2005. - Continue statin, ASA (chagne to 81mg ), BB  2. Hyperlipidemia - 06/04/2015: Cholesterol 136; HDL 60; LDL Cholesterol 57; Triglycerides 94; VLDL 19  - Continue statin.   3. HTN - Relatively stable.   4. AKI - Creatinine improved. Held  ACE. Recheck kidney function post cath.   Signed, Leanor Kail PA-C Pager [phone]  The patient has been seen in conjunction with B. Bhagat, PAC. All aspects of care have been considered and discussed. The patient has been personally interviewed, examined, and all clinical data has been reviewed.   Stable and awaiting cath. She and I had a long conversation yesterday and decided that Rotator Cuff surgery will need to be postponed if stent required. Since she is diabetic, DES should be deployed. There is no role for BMS in this setting.

## 2015-08-05 NOTE — Interval H&P Note (Signed)
History and Physical Interval Note:  08/05/2015 11:53 AM  Brittany Chung  has presented today for surgery, with the diagnosis of acs/non stemi  The various methods of treatment have been discussed with the patient and family. After consideration of risks, benefits and other options for treatment, the patient has consented to  Procedure(s): Left Heart Cath and Coronary Angiography (N/A) as a surgical intervention .  The patient's history has been reviewed, patient examined, no change in status, stable for surgery.  I have reviewed the patient's chart and labs.  Questions were answered to the patient's satisfaction.     Cath Lab Visit (complete for each Cath Lab visit)  Clinical Evaluation Leading to the Procedure:   ACS: Yes.    Non-ACS:    Anginal Classification: CCS IV  Anti-ischemic medical therapy: Minimal Therapy (1 class of medications)  Non-Invasive Test Results: No non-invasive testing performed  Prior CABG: No previous CABG  TIMI Score  Patient Information:  TIMI Score is 5  Revascularization of the presumed culprit artery  A (9)  Indication: 11; Score: 9 TIMI Score  Patient Information:  TIMI Score is 5  Revascularization of multiple coronary arteries when the culprit artery cannot clearly be determined  A (9)  Indication: 12; Score: 9   Brittany Chung

## 2015-08-05 NOTE — Progress Notes (Signed)
TR BAND REMOVAL  LOCATION:    right radial  DEFLATED PER PROTOCOL:    Yes.    TIME BAND OFF / DRESSING APPLIED:    1830   SITE UPON ARRIVAL:    Level 0  SITE AFTER BAND REMOVAL:    Level 0  CIRCULATION SENSATION AND MOVEMENT:    Within Normal Limits   Yes.    COMMENTS:   Tolerated procedure well, post TRB instructions given  

## 2015-08-05 NOTE — Progress Notes (Signed)
ANTICOAGULATION CONSULT NOTE - Follow Up Consult  Pharmacy Consult for Heparin  Indication: chest pain/ACS  Allergies  Allergen Reactions  . Codeine     REACTION: nausea/vomiting  . Neosporin [Neomycin-Bacitracin Zn-Polymyx]   . Petroleum Jelly [Petrolatum]   . Caine-1 [Lidocaine Hcl] Rash  . Clindamycin/Lincomycin Swelling  . Isothiazolinone Chloride Rash  . Neomycin Sulfate Rash  . Phenylene Rash    Phenylenediamine  . Quaternium-15 Rash   Patient Measurements: Height: 5\' 3"  (160 cm) Weight: 141 lb 12.8 oz (64.32 kg) (scale c) IBW/kg (Calculated) : 52.4  Vital Signs: Temp: 98.5 F (36.9 C) (03/03 0000) Temp Source: Oral (03/03 0000) BP: 120/49 mmHg (03/03 0000) Pulse Rate: 69 (03/03 0000)  Labs:  Recent Labs  08/03/15 2124 08/04/15 0005 08/04/15 0709 08/04/15 1238 08/04/15 1848 08/04/15 1934 08/05/15 0345  HGB 11.7* 11.4*  --   --   --   --   --   HCT 36.6 34.0*  --   --   --   --   --   PLT 255 231  --   --   --   --   --   LABPROT  --   --   --   --   --   --  14.2  INR  --   --   --   --   --   --  1.08  HEPARINUNFRC  --   --   --   --   --  1.07* 0.89*  CREATININE 1.45* 1.22*  --   --   --   --  1.15*  TROPONINI 0.03  --  0.33* 0.25* 0.29*  --   --     Estimated Creatinine Clearance: 37.6 mL/min (by C-G formula based on Cr of 1.15).  Assessment: Heparin level remains elevated despite rate decrease, it is trending down. No issues per RN.   Goal of Therapy:  Heparin level 0.3-0.7 units/ml Monitor platelets by anticoagulation protocol: Yes   Plan:  -Decrease heparin to 650 units/hr -1300 HL  Brittany Chung 08/05/2015,5:12 AM

## 2015-08-05 NOTE — Progress Notes (Signed)
Family Medicine Teaching Service Daily Progress Note Intern Pager: [phone]  Patient name: Brittany Chung Medical record number: [id number] Date of birth: [birth year] Age: 74 y.o. Gender: female  Primary Care Provider: Dimas Chyle, MD Consultants: Cardiology Code Status: Full  Pt Overview and Major Events to Date:  Admitted 08/03/15  Assessment and Plan: Brittany Chung is a 75 y.o. female presenting with chest tightness and burning. PMH is significant for HTN, HFpEF, T2DM, HLD, and CVA s/p carotid R endarterectomy. Troponin trended up to 0.33. Cardiology recommending cath 08/05/15.   Chest pain: DDx includes acid reflux, MSK, and ACS. Heart score is 4 for suspicion, age, and risk factors. EKG stable with minimal ST depression in lateral leads. Initially troponin negative then peaked at 0.33. Will go to cath today. Patient had chest pressure last night and this morning.  - trend troponins --> first was negative, second elevated at 0.33 > 0.25 > 0.29 - continue home ASA 325mg - could consider transitioning to 81mg   - Cardiology consulted, appreciated recommendations. Plan for catheterization. Heparin drip started. IVFs to prevent AKI.  - Metoprolol tartrate 12.5 mg BID started by cardiology  Elevated creatinine: Resolved at 1.15. Scr 1.45 on admission, baseline around 1.2. Increase does not meet criteria for AKI however would hold off on any nephrotoxic agents.  - Follow with daily BMPs - IVFs at 40mL/kg/hr   HFpEF- Echo 2 months ago with EF [id number], grade 2 diastolic dysfunction. On enalapril 2.5mg  at home. Stable. - held home enalapril given initial slight increase in creatinine.   HTN- BPs 155-120/49-73 - Restart home enalapril after cath - Metoprolol 12.5 mg BID, added per cardiology - hydralazine PRN   T2DM: On Januvia 100mg  daily and metformin 850mg  BID (patient only taking once daily due to GI discomfort). Last A1c was 6.8 on 06/04/15 however CBG elevated to 299 on admission. Has  been "hiding" one of her metformin pills daily.    - CBGs 120-167 yesterday - hold home medications - sensitive SSI with CBG checks  Hyperlipidemia: Labs 05/2015 at goal: HDL 60, LDL 57, triglycerides 94  - continue home simvastatin   Mild congnitive impairment/dementia  - continue Aricept   Depression/ Bipolar- stable - continue Seroquel 50mg  qHS, and Zoloft 150mg  daily.  Restless leg: Stable. - continue Requip 3mg  qHS  H/o stroke: No residual deficits.  Right rotator cuff tear: Had steroid injection 08/03/15 per report. Scheduled to have scope debridement, acromioplasty, clavicectomy, rotator cuff repair, and biceps tenodesis in May.  - consider tylenol or tramadol as needed for pain   FEN/GI: NPO for cath. Otherwise, heart healthy/carb modified. IVFs at 70mL/kg/hr Prophylaxis: lovenox SQ  Disposition: Admitted on telemetry, discharge pending cath results    Subjective:  [name] experienced chest pressure once overnight and once this morning.   Objective: Temp:  [98 F (36.7 C)-98.6 F (37 C)] 98.3 F (36.8 C) (03/03 0600) Pulse Rate:  [69-96] 71 (03/03 0600) Resp:  [18-20] 18 (03/03 0600) BP: (120-155)/(49-73) 155/56 mmHg (03/03 0600) SpO2:  [95 %-98 %] 95 % (03/03 0600) Weight:  [141 lb 6.4 oz (64.139 kg)] 141 lb 6.4 oz (64.139 kg) (03/03 0600) Physical Exam: General: Well-appearing older woman, resting in bed Cardiovascular: RRR, S1, S2, no m/r/g Chest: No chest wall tenderness. Bibasilar crackles, improved from yesterday, but good air movement throughout.  Abdomen: +BS, S, NT, ND Extremities: WWP, good peripheral pulses  Laboratory:  Recent Labs Lab 08/03/15 2124 08/04/15 0005  WBC 6.8 7.4  HGB 11.7* 11.4*  HCT 36.6 34.0*  PLT 255 231    Recent Labs Lab 08/03/15 2124 08/04/15 0005 08/05/15 0345  NA 140  --  140  K 4.8  --  4.8  CL 104  --  107  CO2 21*  --  23  BUN 33*  --  27*  CREATININE 1.45* 1.22* 1.15*  CALCIUM 9.9  --  9.3   PROT 7.5  --  7.1  BILITOT 0.7  --  0.5  ALKPHOS 98  --  73  ALT 20  --  20  AST 28  --  28  GLUCOSE 299*  --  165*    Imaging/Diagnostic Tests: CXR negative for acute cardiopulmonary disease  Rogue Bussing, MD 08/05/2015, 7:12 AM PGY-1, [place] Intern pager: [phone], text pages welcome

## 2015-08-05 NOTE — H&P (View-Only) (Signed)
Patient Name: Brittany Chung Date of Encounter: 08/05/2015   SUBJECTIVE  Feeling well. No chest pain, sob or palpitations.   CURRENT MEDS . aspirin  325 mg Oral Daily  . donepezil  5 mg Oral QHS  . insulin aspart  0-5 Units Subcutaneous QHS  . insulin aspart  0-9 Units Subcutaneous TID WC  . metoprolol tartrate  12.5 mg Oral BID  . QUEtiapine  50 mg Oral QHS  . rOPINIRole  3 mg Oral QHS  . sertraline  150 mg Oral Daily  . simvastatin  40 mg Oral QHS  . sodium chloride flush  3 mL Intravenous Q12H    OBJECTIVE  Filed Vitals:   08/04/15 2021 08/05/15 0000 08/05/15 0600 08/05/15 0900  BP: 141/61 120/49 155/56 145/58  Pulse: 76 69 71 68  Temp: 98.5 F (36.9 C) 98.5 F (36.9 C) 98.3 F (36.8 C) 97.3 F (36.3 C)  TempSrc: Oral Oral Oral Oral  Resp: 20 18 18 18   Height:      Weight:   141 lb 6.4 oz (64.139 kg)   SpO2: 98% 96% 95% 97%    Intake/Output Summary (Last 24 hours) at 08/05/15 1034 Last data filed at 08/05/15 0835  Gross per 24 hour  Intake 1286.64 ml  Output    150 ml  Net 1136.64 ml   Filed Weights   08/03/15 2319 08/04/15 0448 08/05/15 0600  Weight: 143 lb 1.6 oz (64.91 kg) 141 lb 12.8 oz (64.32 kg) 141 lb 6.4 oz (64.139 kg)    PHYSICAL EXAM  General: Pleasant, NAD. Neuro: Alert and oriented X 3. Moves all extremities spontaneously. Psych: Normal affect. HEENT:  Normal  Neck: Supple without bruits or JVD. Lungs:  Resp regular and unlabored, CTA. Heart: RRR no s3, s4, or murmurs. Abdomen: Soft, non-tender, non-distended, BS + x 4.  Extremities: No clubbing, cyanosis or edema. DP/PT/Radials 2+ and equal bilaterally.  Accessory Clinical Findings  CBC  Recent Labs  08/03/15 2124 08/04/15 0005  WBC 6.8 7.4  NEUTROABS 4.9  --   HGB 11.7* 11.4*  HCT 36.6 34.0*  MCV 87.1 87.0  PLT 255 [id number]   Basic Metabolic Panel  Recent Labs  08/03/15 2124 08/04/15 0005 08/05/15 0345  NA 140  --  140  K 4.8  --  4.8  CL 104  --  107  CO2 21*   --  23  GLUCOSE 299*  --  165*  BUN 33*  --  27*  CREATININE 1.45* 1.22* 1.15*  CALCIUM 9.9  --  9.3   Liver Function Tests  Recent Labs  08/03/15 2124 08/05/15 0345  AST 28 28  ALT 20 20  ALKPHOS 98 73  BILITOT 0.7 0.5  PROT 7.5 7.1  ALBUMIN 4.0 3.7   No results for input(s): LIPASE, AMYLASE in the last 72 hours. Cardiac Enzymes  Recent Labs  08/04/15 0709 08/04/15 1238 08/04/15 1848  TROPONINI 0.33* 0.25* 0.29*    TELE  Sinus rhythm  Radiology/Studies  Dg Shoulder Right  07/11/2015  CLINICAL DATA:  75 year old female with fall and right shoulder pain. EXAM: RIGHT SHOULDER - 2+ VIEW COMPARISON:  Radiograph dated 12/01/2014 FINDINGS: There is no acute fracture or dislocation. The bones are osteopenic. There is mild elevation of the right femur compatible with chronic rotator cuff injury. There are moderate degenerative changes of the right acromioclavicular joint. Right apical scarring and surgical clip along the right side of the neck again noted. IMPRESSION: No acute fracture or dislocation.  Electronically Signed   By: Anner Crete M.D.   On: 07/11/2015 21:11   Dg Chest Port 1 View  08/03/2015  CLINICAL DATA:  75 year old female with history of chest tightness today radiating from the right to the left side of the chest and the arms with some burning. Shortness of breath. EXAM: PORTABLE CHEST 1 VIEW COMPARISON:  Chest x-ray 07/08/2012. FINDINGS: Lung volumes are normal. No consolidative airspace disease. No pleural effusions. No pneumothorax. No pulmonary nodule or mass noted. Extensive bilateral apical pleural parenchymal thickening and calcifications, similar prior examinations, most compatible with chronic post infectious or inflammatory scarring. Pulmonary vasculature and the cardiomediastinal silhouette are within normal limits. Atherosclerosis in the thoracic aorta. IMPRESSION: 1.  No radiographic evidence of acute cardiopulmonary disease. 2. Atherosclerosis.  Electronically Signed   By: Vinnie Langton M.D.   On: 08/03/2015 20:54    ASSESSMENT AND PLAN  1. NSTEMI - No further chest pain.  - Will place on IV Heparin. Troponin 0.33-->0.25-->0.29.  - For cath today. She is schedule to have R should surgery for Rotator Cuff Tear in May. Consider BMS.?if patient is allergic to contrast dye (states that she had dizziness during prior cath about 10-15 years ago during/after stroke) unable to provide any further information. Seems "Flushing Manueuver" during R [facility] procedure note 05/2005. - Continue statin, ASA (chagne to 81mg ), BB  2. Hyperlipidemia - 06/04/2015: Cholesterol 136; HDL 60; LDL Cholesterol 57; Triglycerides 94; VLDL 19  - Continue statin.   3. HTN - Relatively stable.   4. AKI - Creatinine improved. Held  ACE. Recheck kidney function post cath.   Signed, Leanor Kail PA-C Pager [phone]  The patient has been seen in conjunction with B. Bhagat, PAC. All aspects of care have been considered and discussed. The patient has been personally interviewed, examined, and all clinical data has been reviewed.   Stable and awaiting cath. She and I had a long conversation yesterday and decided that Rotator Cuff surgery will need to be postponed if stent required. Since she is diabetic, DES should be deployed. There is no role for BMS in this setting.

## 2015-08-06 LAB — BASIC METABOLIC PANEL
Anion gap: 12 (ref 5–15)
BUN: 23 mg/dL — AB (ref 6–20)
CHLORIDE: 106 mmol/L (ref 101–111)
CO2: 21 mmol/L — AB (ref 22–32)
CREATININE: 1.15 mg/dL — AB (ref 0.44–1.00)
Calcium: 10 mg/dL (ref 8.9–10.3)
GFR calc Af Amer: 52 mL/min — ABNORMAL LOW (ref >60)
GFR calc non Af Amer: 45 mL/min — ABNORMAL LOW (ref >60)
GLUCOSE: 165 mg/dL — AB (ref 65–99)
Potassium: 4.8 mmol/L (ref 3.5–5.1)
Sodium: 139 mmol/L (ref 135–145)

## 2015-08-06 LAB — CBC
HCT: 33.9 % — ABNORMAL LOW (ref 36.0–46.0)
HEMATOCRIT: 36.2 % (ref 36.0–46.0)
HEMOGLOBIN: 11.5 g/dL — AB (ref 12.0–15.0)
Hemoglobin: 11.7 g/dL — ABNORMAL LOW (ref 12.0–15.0)
MCH: 28 pg (ref 26.0–34.0)
MCH: 29 pg (ref 26.0–34.0)
MCHC: 32.3 g/dL (ref 30.0–36.0)
MCHC: 33.9 g/dL (ref 30.0–36.0)
MCV: 85.6 fL (ref 78.0–100.0)
MCV: 86.6 fL (ref 78.0–100.0)
Platelets: 217 10*3/uL (ref 150–400)
Platelets: 259 10*3/uL (ref 150–400)
RBC: 3.96 MIL/uL (ref 3.87–5.11)
RBC: 4.18 MIL/uL (ref 3.87–5.11)
RDW: 13.8 % (ref 11.5–15.5)
RDW: 13.9 % (ref 11.5–15.5)
WBC: 14.8 10*3/uL — AB (ref 4.0–10.5)
WBC: 14.9 10*3/uL — ABNORMAL HIGH (ref 4.0–10.5)

## 2015-08-06 LAB — GLUCOSE, CAPILLARY: Glucose-Capillary: 143 mg/dL — ABNORMAL HIGH (ref 65–99)

## 2015-08-06 MED ORDER — METOPROLOL TARTRATE 25 MG PO TABS: 12.5000 mg | ORAL_TABLET | Freq: Two times a day (BID) | ORAL | Status: DC

## 2015-08-06 MED ORDER — TICAGRELOR 90 MG PO TABS: 90.0000 mg | ORAL_TABLET | Freq: Two times a day (BID) | ORAL | Status: DC

## 2015-08-06 MED ORDER — GUAIFENESIN-DM 100-10 MG/5ML PO SYRP
5.0000 mL | ORAL_SOLUTION | ORAL | Status: DC | PRN
  Filled 2015-08-06: qty 5

## 2015-08-06 MED ORDER — ATORVASTATIN CALCIUM 80 MG PO TABS: 80.0000 mg | ORAL_TABLET | Freq: Every day | ORAL | Status: DC

## 2015-08-06 MED ORDER — ASPIRIN 81 MG PO CHEW: 81.0000 mg | CHEWABLE_TABLET | Freq: Every day | ORAL | Status: DC

## 2015-08-06 MED ORDER — TICAGRELOR 90 MG PO TABS
90.0000 mg | ORAL_TABLET | Freq: Two times a day (BID) | ORAL | Status: DC
Start: 2015-08-06 — End: 2016-07-03

## 2015-08-06 MED ORDER — ANGIOPLASTY BOOK
Freq: Once | Status: AC
  Administered 2015-08-06: 01:00:00
  Filled 2015-08-06: qty 1

## 2015-08-06 NOTE — Progress Notes (Signed)
CARDIAC REHAB PHASE I   PRE:  Rate/Rhythm: 68   BP:  Supine:   Sitting: 143/36  Standing:    SaO2: 98 RA  MODE:  Ambulation: 500 ft   POST:  Rate/Rhythm: 74 SR  BP:  Supine:   Sitting: 118/41  Standing:    SaO2: 94 RA Tolerated ambulation well without symptoms.  Education completed re: angina symptoms, NTG usage, when to call 911, when to call the MD.  Completed diabetic nutrition tips, exercise progression, activity restrictions, cath site care, and signs and symptoms of an infection and bleeding.  Stress to call the MD if she sees visible blood in her urine once home, this has been noticed here and is being monitored. [phone]  Liliane Channel RN, BSN 08/06/2015 8:56 AM

## 2015-08-06 NOTE — Discharge Instructions (Signed)
Brittany Chung,   You were admitted for chest pain work-up and due to elevated cardiac enzymes underwent cardiac catheterization. You had severe blockages in 2 vessels, the right coronary artery and left circumflex artery. A stent was placed in the left circumflex. You will need to continue taking brilinta to keep your stent open. Another blood pressure medication was added to your regimen, as well, called metoprolol. Your cholesterol medication was switched to atorvastatin 80 mg daily.   Keeping your diabetes well controlled by not skipping any medication doses, increasing physical activity, and eating a healthy diet are all ways to help prevent worsening of coronary artery disease.   If the color of your urine becomes darker, if you become dizzy, or shortness of breath worsens, please seek medical attention.

## 2015-08-06 NOTE — Progress Notes (Signed)
Patient ID: [name], female   DOB: [birth year], 75 y.o.   MRN: [id number]    Subjective:    No complaints overnight.   Objective:   Temp:  [97 F (36.1 C)-98.4 F (36.9 C)] 98 F (36.7 C) (03/04 0744) Pulse Rate:  [0-74] 58 (03/04 0744) Resp:  [0-34] 15 (03/04 0744) BP: (108-163)/(36-85) 143/36 mmHg (03/04 0744) SpO2:  [0 %-100 %] 100 % (03/04 0744) Weight:  [147 lb 1.6 oz (66.724 kg)] 147 lb 1.6 oz (66.724 kg) (03/04 0414) Last BM Date: 08/05/15  Filed Weights   08/04/15 0448 08/05/15 0600 08/06/15 0414  Weight: 141 lb 12.8 oz (64.32 kg) 141 lb 6.4 oz (64.139 kg) 147 lb 1.6 oz (66.724 kg)    Intake/Output Summary (Last 24 hours) at 08/06/15 0803 Last data filed at 08/06/15 0600  Gross per 24 hour  Intake 424.48 ml  Output   1800 ml  Net -1375.52 ml    Telemetry: NSR  Exam:  General: NAD  HEENT: sclera clear, throat clear  Resp: CTAB  Cardiac: RRR, no mr/g, no jvd  GI: abdomen soft, NT, ND  MSK: no LE edema  Neuro: no focal deficits  Psych: appropriate affect  Lab Results:  Basic Metabolic Panel:  Recent Labs Lab 08/03/15 2124 08/04/15 0005 08/05/15 0345 08/06/15 0457  NA 140  --  140 139  K 4.8  --  4.8 4.8  CL 104  --  107 106  CO2 21*  --  23 21*  GLUCOSE 299*  --  165* 165*  BUN 33*  --  27* 23*  CREATININE 1.45* 1.22* 1.15* 1.15*  CALCIUM 9.9  --  9.3 10.0    Liver Function Tests:  Recent Labs Lab 08/03/15 2124 08/05/15 0345  AST 28 28  ALT 20 20  ALKPHOS 98 73  BILITOT 0.7 0.5  PROT 7.5 7.1  ALBUMIN 4.0 3.7    CBC:  Recent Labs Lab 08/04/15 0005 08/06/15 0035 08/06/15 0457  WBC 7.4 14.9* 14.8*  HGB 11.4* 11.5* 11.7*  HCT 34.0* 33.9* 36.2  MCV 87.0 85.6 86.6  PLT 231 217 259    Cardiac Enzymes:  Recent Labs Lab 08/04/15 0709 08/04/15 1238 08/04/15 1848  TROPONINI 0.33* 0.25* 0.29*    BNP: No results for input(s): PROBNP in the last 8760 hours.  Coagulation:  Recent Labs Lab 08/05/15 0345    INR 1.08    ECG:   Medications:   Scheduled Medications: . aspirin  81 mg Oral Daily  . atorvastatin  80 mg Oral q1800  . donepezil  5 mg Oral QHS  . insulin aspart  0-5 Units Subcutaneous QHS  . insulin aspart  0-9 Units Subcutaneous TID WC  . metoprolol tartrate  12.5 mg Oral BID  . QUEtiapine  50 mg Oral QHS  . rOPINIRole  3 mg Oral QHS  . sertraline  150 mg Oral Daily  . sodium chloride flush  3 mL Intravenous Q12H  . ticagrelor  90 mg Oral BID     Infusions:     PRN Medications:  sodium chloride, acetaminophen, gi cocktail, guaiFENesin-dextromethorphan, hydrALAZINE, ondansetron (ZOFRAN) IV, sodium chloride flush     Assessment/Plan    1. NSTEMI - peak trop of 0.33. Cath yesterday, prox LCX 80% s/p DES, RCA CTO filled left to right collaterals, ramus 50%, distal LAD 55%. Recs for medical therapry for RCA CTO - medical therapy with ASA, atorva 80, lopressor 12.5mg  bid, brillinta.  - recommend delaying rotator cuff surgery  at this time, if absolutely neccesary could consider at 3 months however further delay would lower the risk of complications further.  - please provide 30 day brillinta x 1 month to use with free coupon, and separate prescription for brillinta for 11 months. [place] for discharge today    Carlyle Dolly, M.D.

## 2015-08-06 NOTE — Discharge Summary (Signed)
[facility] Discharge Summary  Patient name: Brittany Chung record number: [id number] Date of birth: Feb 03, 1941 Age: 75 y.o. Gender: female Date of Admission: 08/03/2015  Date of Discharge: 08/06/2015 Admitting Physician: Leeanne Rio, MD  Primary Care Provider: Dimas Chyle, MD Consultants: Cardiology  Indication for Hospitalization: Chest pain  Discharge Diagnoses/Problem List:  Principal Problem:   Acute coronary syndrome [facility]) Active Problems:   S/P carotid endarterectomy   Diastolic HF (heart failure) ([place])   Chest pain   Type 2 diabetes mellitus with complication ([place])   Essential hypertension   Pain in the chest  Disposition: Home  Discharge Condition: Improved  Discharge Exam:  Blood pressure 143/36, pulse 58, temperature 98 F (36.7 C), temperature source Oral, resp. rate 15, height 5\' 3"  (1.6 m), weight 147 lb 1.6 oz (66.724 kg), SpO2 100 %. General: Well-appearing, sitting up in bed, talking with cardiac rehab specialist Cardiovascular: RRR, S1, S2, no m/r/g Chest: Bibasilar crackles with good air movement, no increased WOB Abdomen: +BS, S, NT, ND Extremities: WWP, strong DP pulses, seen walking down hall with cardiac rehab specialist without issue  Brief Hospital Course:  Brittany Chung is a 75 y.o. female who presented with chest tightness and burning. PMH is significant for HTN, HFpEF, T2DM, HLD, depression/bipolar disorder, mild dementia, and CVA s/p carotid R endarterectomy. EKG had minimal ST depression in lateral leads. Initial troponin was negative but peaked at 0.33. Patient experienced chest pressured during hospitalization, as well. Cardiology performed catheterization 08/05/15 and found severe 2 vessel disease to RCA and Cx with moderate disease of the LAD. DES was placed in proximal circumflex, and patient was started on brilinta. Cardiology's recommendations to maximize medical therapy for RCA complete total  occlusion seen on cath were to continue aspirin at 81 mg (switched from 325 mg), atorvastatin at 80 mg, lopressor 12.5 mg BID, and brilinta. Patient had blood-tinged urine after starting brilinta, with large hgb on UA. Hemoglobin remained stable and urine was clearing by time of discharge. Patient complained of some SOB after catheterization but was showing no signs of increased respiratory effort and ambulated well; this was thought to be due to deconditioning and atelectasis from hospital stay.   Hospitalization was also complicated by AKI to 0000000 (baseline around 1.2), which resolved to 1.15 after IVFs. Enalapril was initially held but restarted at discharge.   Issues for Follow Up:  1. Obtain repeat UA to check for gross blood, as patient had hematuria after starting brilinta.  2. Reinforce importance of taking risk-modifying drugs and not skipping metformin doses.  3. Assess blood pressure, given addition of metoprolol.   Significant Procedures: Cardiac catheterization 08/05/15  Significant Labs and Imaging:   Recent Labs Lab 08/04/15 0005 08/06/15 0035 08/06/15 0457  WBC 7.4 14.9* 14.8*  HGB 11.4* 11.5* 11.7*  HCT 34.0* 33.9* 36.2  PLT 231 217 259    Recent Labs Lab 08/03/15 2124 08/04/15 0005 08/05/15 0345 08/06/15 0457  NA 140  --  140 139  K 4.8  --  4.8 4.8  CL 104  --  107 106  CO2 21*  --  23 21*  GLUCOSE 299*  --  165* 165*  BUN 33*  --  27* 23*  CREATININE 1.45* 1.22* 1.15* 1.15*  CALCIUM 9.9  --  9.3 10.0  ALKPHOS 98  --  73  --   AST 28  --  28  --   ALT 20  --  20  --  ALBUMIN 4.0  --  3.7  --     Results/Tests Pending at Time of Discharge: None  Discharge Medications:    Medication List    STOP taking these medications        aspirin 325 MG tablet  Replaced by:  aspirin 81 MG chewable tablet     metFORMIN 850 MG tablet  Commonly known as:  GLUCOPHAGE     simvastatin 40 MG tablet  Commonly known as:  ZOCOR      TAKE these medications         aspirin 81 MG chewable tablet  Chew 1 tablet (81 mg total) by mouth daily.     atorvastatin 80 MG tablet  Commonly known as:  LIPITOR  Take 1 tablet (80 mg total) by mouth daily at 6 PM.     b complex vitamins tablet  Take 1 tablet by mouth daily.     Co Q-10 100 MG Caps  Take 1 capsule by mouth daily.     donepezil 5 MG tablet  Commonly known as:  ARICEPT  TAKE 1 TABLET (5 MG TOTAL) BY MOUTH AT BEDTIME.     enalapril 2.5 MG tablet  Commonly known as:  VASOTEC  TAKE 1 TABLET BY MOUTH EVERY DAY     Fish Oil 1000 MG Caps  Take 1 capsule by mouth daily.     GLUCERNA ADVANCE SHAKE Liqd  Take 1 Bottle by mouth daily.     Incontinence Supplies Misc  Use as directed     JANUVIA 100 MG tablet  Generic drug:  sitaGLIPtin  TAKE 1 TABLET BY MOUTH EVERY DAY     metoprolol tartrate 25 MG tablet  Commonly known as:  LOPRESSOR  Take 0.5 tablets (12.5 mg total) by mouth 2 (two) times daily.     MSM GLUCOSAMINE COMPLEX PO  Take 1 tablet by mouth daily. Reported on 08/03/2015     multivitamin with minerals tablet  Take 1 tablet by mouth every evening.     QUEtiapine 50 MG tablet  Commonly known as:  SEROQUEL  Take 1 tab at bed time     Reality Incontinent Briefs SM Misc  Use as needed.     rOPINIRole 1 MG tablet  Commonly known as:  REQUIP  Take 3 tablets (3 mg total) by mouth at bedtime.     sertraline 100 MG tablet  Commonly known as:  ZOLOFT  Take 1 and 1/2 tab daily     ticagrelor 90 MG Tabs tablet  Commonly known as:  BRILINTA  Take 1 tablet (90 mg total) by mouth 2 (two) times daily.     ticagrelor 90 MG Tabs tablet  Commonly known as:  BRILINTA  Take 1 tablet (90 mg total) by mouth 2 (two) times daily.        Discharge Instructions: Please refer to Patient Instructions section of EMR for full details.  Patient was counseled important signs and symptoms that should prompt return to medical care, changes in medications, dietary instructions, activity  restrictions, and follow up appointments.   Follow-Up Appointments: Follow-up Information    Follow up with Quay Burow, MD.   Specialties:  Cardiology, Radiology   Why:  The office will call on Monday to schedule your follow-up appt.   Contact information:   [address] [place] [address] [phone]       Follow up with Marina Goodell, MD. Go on 08/09/2015.   Specialty:  Family Medicine   Why:  10:45 a.m. hospital follow-up   Contact information:   [place] [address] [phone]       Rogue Bussing, MD 08/08/2015, 10:41 PM PGY-1, [place]

## 2015-08-06 NOTE — Progress Notes (Signed)
Family Medicine Teaching Service Daily Progress Note Intern Pager: [phone]  Patient name: Brittany Chung Medical record number: [id number] Date of birth: 1941-01-16 Age: 75 y.o. Gender: female  Primary Care Provider: Dimas Chyle, MD Consultants: Cardiology Code Status: Full  Pt Overview and Major Events to Date:  Admitted 08/03/15 for chest pain Cardiac catheterization with DES to Prox Cx 08/05/15  Assessment and Plan: Brittany Chung is a 75 y.o. female who presented with chest tightness and burning. PMH is significant for HTN, HFpEF, T2DM, HLD, and CVA s/p carotid R endarterectomy. Troponin trended up to 0.33. Cardiology performed cath 08/05/15 and found severe 2 vessel disease to RCA and Cx with moderate disease of the LAD. DES placed in proximal circumflex.   Chest pain: Patient found to have severe 2 vessel disease requiring stent placement. No chest pain s/p cath but has had some SOB with exertion.  - Switch home ASA 325mg  to 81mg   - Cardiology consulting, appreciate recommendations.  - Continue metoprolol tartrate 12.5 mg BID started by cardiology  Hematuria: Improving. Bright pink urine with large hgb on UA. Patient says color of urine improved this morning.  - Hgb stable overnight at 11.5 > 11.7  Elevated creatinine: Resolved. Stable at 1.15 s/p cath. Scr 1.45 on admission, baseline around 1.2.  - Follow with daily BMPs  HFpEF- Echo 2 months ago with EF 123456, grade 2 diastolic dysfunction. On enalapril 2.5mg  at home. Stable. - held home enalapril given initial slight increase in creatinine.  - Now on metoprolol tartrate 12.5 mg BID  HTN- BPs overnight 130-145/47-85 - Consider restarting enalapril today, given stable SCr - Metoprolol 12.5 mg BID, added per cardiology - hydralazine PRN   T2DM: On Januvia 100mg  daily and metformin 850mg  BID (patient only taking once daily due to GI discomfort). Last A1c was 6.8 on 06/04/15 however CBG elevated to 299 on admission. Has  been "hiding" one of her metformin pills daily.   - CBGs 118-145 yesterday - holding home medications - sensitive SSI with CBG checks  Hyperlipidemia: Labs 05/2015 at goal: HDL 60, LDL 57, triglycerides 94  - Switched to lipitor 80 mg  Mild congnitive impairment/dementia  - continue Aricept   Depression/ Bipolar- stable - continue Seroquel 50mg  qHS, and Zoloft 150mg  daily.  Restless leg: Stable. - continue Requip 3mg  qHS  H/o stroke: No residual deficits.  Right rotator cuff tear: Had steroid injection 08/03/15 per report. Scheduled to have scope debridement, acromioplasty, clavicectomy, rotator cuff repair, and biceps tenodesis in May.  - consider tylenol or tramadol as needed for pain   FEN/GI: Heart healthy/carb modified. KVO.  Prophylaxis: None  Disposition: Admitted on telemetry, likely discharge today  Subjective:  Patient had no acute events overnight. She still complains of some SOB with exertion. No more discomfort with urination.   Objective: Temp:  [97 F (36.1 C)-98.4 F (36.9 C)] 97 F (36.1 C) (03/04 0414) Pulse Rate:  [0-74] 56 (03/04 0414) Resp:  [0-34] 18 (03/04 0414) BP: (108-163)/(47-85) 145/85 mmHg (03/04 0414) SpO2:  [0 %-100 %] 100 % (03/04 0414) Weight:  [147 lb 1.6 oz (66.724 kg)] 147 lb 1.6 oz (66.724 kg) (03/04 0414) Physical Exam: General: Well-appearing, sitting up in bed, talking with cardiac rehab specialist Cardiovascular: RRR, S1, S2, no m/r/g Chest: Bibasilar crackles with good air movement, no increased WOB Abdomen: +BS, S, NT, ND Extremities: WWP, strong DP pulses, seen walking down hall with cardiac rehab specialist without issue  Laboratory:  Recent Labs Lab 08/04/15 0005  08/06/15 0035 08/06/15 0457  WBC 7.4 14.9* 14.8*  HGB 11.4* 11.5* 11.7*  HCT 34.0* 33.9* 36.2  PLT 231 217 259    Recent Labs Lab 08/03/15 2124 08/04/15 0005 08/05/15 0345 08/06/15 0457  NA 140  --  140 139  K 4.8  --  4.8 4.8  CL 104  --   107 106  CO2 21*  --  23 21*  BUN 33*  --  27* 23*  CREATININE 1.45* 1.22* 1.15* 1.15*  CALCIUM 9.9  --  9.3 10.0  PROT 7.5  --  7.1  --   BILITOT 0.7  --  0.5  --   ALKPHOS 98  --  73  --   ALT 20  --  20  --   AST 28  --  28  --   GLUCOSE 299*  --  165* 165*    Imaging/Diagnostic Tests: Cath yesterday showing severe 2 vessel disease  Rogue Bussing, MD 08/06/2015, 7:03 AM PGY-1, [place] Intern pager: [phone], text pages welcome

## 2015-08-07 ENCOUNTER — Other Ambulatory Visit: Payer: Self-pay | Admitting: Family Medicine

## 2015-08-09 ENCOUNTER — Ambulatory Visit (INDEPENDENT_AMBULATORY_CARE_PROVIDER_SITE_OTHER): Payer: Commercial Managed Care - HMO | Admitting: Student

## 2015-08-09 ENCOUNTER — Encounter: Payer: Self-pay | Admitting: Student

## 2015-08-09 VITALS — BP 116/50 | HR 64 | Temp 98.6°F | Ht 63.0 in | Wt 141.0 lb

## 2015-08-09 DIAGNOSIS — Z87448 Personal history of other diseases of urinary system: Secondary | ICD-10-CM | POA: Diagnosis not present

## 2015-08-09 DIAGNOSIS — R319 Hematuria, unspecified: Secondary | ICD-10-CM | POA: Diagnosis not present

## 2015-08-09 DIAGNOSIS — I2510 Atherosclerotic heart disease of native coronary artery without angina pectoris: Secondary | ICD-10-CM | POA: Insufficient documentation

## 2015-08-09 LAB — POCT URINALYSIS DIPSTICK
Bilirubin, UA: NEGATIVE
GLUCOSE UA: NEGATIVE
Ketones, UA: NEGATIVE
Leukocytes, UA: NEGATIVE
Nitrite, UA: NEGATIVE
Protein, UA: NEGATIVE
SPEC GRAV UA: 1.015
UROBILINOGEN UA: 0.2
pH, UA: 6

## 2015-08-09 LAB — POCT UA - MICROSCOPIC ONLY

## 2015-08-09 NOTE — Assessment & Plan Note (Signed)
Continued noncompliance with metformin regimen due to GI upset. However compliant with Januvia. Patient due for A1c in 3 weeks. - Will follow in 3 weeks for A1c and discuss adjustments to diabetes regimen at that time

## 2015-08-09 NOTE — Progress Notes (Signed)
   Subjective:    Patient ID: Brittany Chung, female    DOB: [birth year], 75 y.o.   MRN: [id number]   CC: Hospital follow up for chest pain  HPI: 75 year old female presenting for hospital follow-up for chest pain where she had cardiac catheterization on 08/05/15 which showedsevere 2 vessel disease to RCA and Cx with moderate disease of the LAD   CAD - Patient denies chest pain, shortness of breath since hospital discharge - She has been able to tolerate her usual activities without difficulty - She was started on metoprolol and she reports compliance with this.  - Of note she reports dizziness from getting up from a sitting position while in the waiting room this morning, however she denies having dizziness like this prior to this episode. Denies dizziness at this time. Reports that this episode of dizziness lasted less than a minute. - Denies new onset swelling  DM - Patient reports that she continues to take 1 metformin per day as opposed to the directed twice a day metformin.  -She reports that this is due to stomach upset with the evening dose -  reports compliance with Januvia prescription  - Posterior lumen A1c was on 12/31 and was 6.8   Hematuria  - While admitted she was noted to have hematuria of unclear origin  - Of note UA well and patient was positive for leukocytes and turbid appearance. She reports that she had dysuria while inpatient but denies any at this time.   Review of Systems ROS  Per history of present illness otherwise she denies recent illnesses, fevers, nausea, vomiting, diarrhea, abdominal pain, dysuria   Past Medical, Surgical, Social, and Family History Reviewed & Updated per EMR.   Objective:  BP 116/50 mmHg  Pulse 64  Temp(Src) 98.6 F (37 C) (Oral)  Ht 5\' 3"  (1.6 m)  Wt 141 lb (63.957 kg)  BMI 24.98 kg/m2  SpO2 98% Vitals and nursing note reviewed  General: NAD Cardiac: RRR,  Respiratory: CTAB, normal effort Abdomen: soft, nontender,  nondistended, Bowel sounds present Extremities: no LE edema Neuro: alert and oriented, no focal deficits   Assessment & Plan:    Diabetes mellitus, type II Continued noncompliance with metformin regimen due to GI upset. However compliant with Januvia. Patient due for A1c in 3 weeks. - Will follow in 3 weeks for A1c and discuss adjustments to diabetes regimen at that time  Hematuria Persistent hematuria in setting of questionable urinary tract infection while admitted. No persistent evidence of infection on UA today.  - Repeat UA at follow up office visit to assess for resolution    CAD (coronary artery disease) Stable on brilinta and metoprolol - Will follow with cardiology on 08/24/2015     Noemi Bellissimo A. Lincoln Brigham MD, [place] [facility] Resident PGY-2 Pager [phone]

## 2015-08-09 NOTE — Assessment & Plan Note (Signed)
Persistent hematuria in setting of questionable urinary tract infection while admitted. No persistent evidence of infection on UA today.  - Repeat UA at follow up office visit to assess for resolution

## 2015-08-09 NOTE — Patient Instructions (Signed)
Follow-up in 3 weeks for diabetes check If you have questions or concerns please call the office at [phone]  If he developed chest pain, lightheadedness, dizziness call the office

## 2015-08-09 NOTE — Assessment & Plan Note (Signed)
Stable on brilinta and metoprolol - Will follow with cardiology on 08/24/2015

## 2015-08-10 ENCOUNTER — Ambulatory Visit (HOSPITAL_COMMUNITY): Payer: Self-pay | Admitting: Clinical

## 2015-08-10 ENCOUNTER — Telehealth: Payer: Self-pay | Admitting: Family Medicine

## 2015-08-10 ENCOUNTER — Telehealth: Payer: Self-pay | Admitting: Psychology

## 2015-08-10 NOTE — Telephone Encounter (Signed)
Brittany Chung would like to speak with provider on a conference call with her daughter to discuss her medical condition and what was the outcome of visit she recently had.

## 2015-08-10 NOTE — Telephone Encounter (Signed)
Patient called and left a VM canceling an appointment but we did not have one scheduled and I wasn't able to find an appointment in our system for another counselor.  Called back and left a VM.

## 2015-08-10 NOTE — Telephone Encounter (Signed)
Please inform this patient that her PCP is out of town until Monday.

## 2015-08-11 NOTE — Telephone Encounter (Signed)
lmovm for pt to return call. Clytie Shetley Dawn, CMA  

## 2015-08-15 NOTE — Telephone Encounter (Signed)
Will forward message to PCP Dr. Jerline Pain.

## 2015-08-15 NOTE — Telephone Encounter (Signed)
I am not sure what she means by the outcome of her recent visit. She was recently hospitalized last week and had an appointment with Dr Lincoln Brigham last week. It would probably be better for the patient and her daughter to come in for a office visit to address their concerns.   Brittany Chung. Jerline Pain, [place] [facility] Resident PGY-2 08/15/2015 2:40 PM

## 2015-08-15 NOTE — Telephone Encounter (Signed)
LM for patient to call back and to schedule an appt with her PCP and to bring her children with her. Souleymane Saiki,CMA

## 2015-08-24 ENCOUNTER — Ambulatory Visit: Payer: Commercial Managed Care - HMO | Admitting: Physician Assistant

## 2015-08-24 ENCOUNTER — Ambulatory Visit: Payer: Self-pay | Admitting: Cardiology

## 2015-08-29 ENCOUNTER — Encounter: Payer: Self-pay | Admitting: Physician Assistant

## 2015-08-29 ENCOUNTER — Ambulatory Visit (INDEPENDENT_AMBULATORY_CARE_PROVIDER_SITE_OTHER): Payer: Commercial Managed Care - HMO | Admitting: Physician Assistant

## 2015-08-29 VITALS — BP 90/40 | HR 68 | Ht 63.0 in | Wt 142.0 lb

## 2015-08-29 DIAGNOSIS — E785 Hyperlipidemia, unspecified: Secondary | ICD-10-CM | POA: Diagnosis not present

## 2015-08-29 DIAGNOSIS — I10 Essential (primary) hypertension: Secondary | ICD-10-CM

## 2015-08-29 DIAGNOSIS — I959 Hypotension, unspecified: Secondary | ICD-10-CM | POA: Insufficient documentation

## 2015-08-29 DIAGNOSIS — I2510 Atherosclerotic heart disease of native coronary artery without angina pectoris: Secondary | ICD-10-CM | POA: Diagnosis not present

## 2015-08-29 DIAGNOSIS — I2583 Coronary atherosclerosis due to lipid rich plaque: Principal | ICD-10-CM

## 2015-08-29 DIAGNOSIS — I5032 Chronic diastolic (congestive) heart failure: Secondary | ICD-10-CM

## 2015-08-29 DIAGNOSIS — I9589 Other hypotension: Secondary | ICD-10-CM

## 2015-08-29 NOTE — Patient Instructions (Signed)
Your physician recommends that you schedule a follow-up appointment in: 3 months with Dr Pernell Dupre.  [facility] been [facility] phase 2 rehab.

## 2015-08-29 NOTE — Progress Notes (Signed)
Patient ID: Brittany Chung, female   DOB: Aug 25, 1940, 75 y.o.   MRN: [id number]    Date:  08/29/2015   ID:  Brittany Chung, DOB 1940-11-16, MRN [id number]  PCP:  Dimas Chyle, MD  Primary Cardiologist:  [facility]  Chief Complaint  Patient presents with  . Follow-up    no chest pain, lit swelling and swelling, some shortness of breath, some dizziness or lightheadedn     History of Present Illness: Brittany Chung is a 75 y.o. female with a past medical history of CVA (no residual) s/p right carotid endarterectomy, HTN, DM, HLD and diastolic CHF (grade 2). On 08/03/15, she was in her usual state of health until she developed chest pain that she describes as a "burning" and pressure that radiated across her chest from right axilla to left axilla.   She underwent cardiac catheterization revealing circumflex 80% for which she received a drug-eluting stent. Right coronary artery was totally occluded and filled from left-to-right collaterals. The  Proximal ramus  had 50%   Stenosis and the distal LAD with 55%.   She was changed from her previous statin to Lipitor 80 mg and started on aspirin, Briinta and Lopressor 12.5 mg twice daily.   Rotator cuff surgery will have to be delayed for a minimum of 3 months.   Her last echocardiogram was January 2017 her ejection fraction was 60-65% with normal wall motion. Grade 2 diastolic dysfunction. Mild aortic regurgitation and mild mitral valve regurgitation. Peak PA pressure was 31 mmol millimeters of mercury. Mid tricuspid and pulmonic valve regurgitation a well.   he presents for posthospital follow-up says she feels "draggy" according to her daughter her blood pressure runs low  Today is 90/40 in the office.  She said yesterday she had 6-7 loose bowel movements. Her daughter says that the metformin she normally takes upsets her stomach.   She also had some intermittent hematuria, which was what also evaluated in the hospital,  Along with some shortness of breath  dizziness.   She currently denies nausea, vomiting, fever, chest pain, shortness of breath, orthopnea, dizziness, PND, cough, congestion, abdominal pain, hematochezia, melena, lower extremity edema, claudication.  Post-Intervention Diagram           Wt Readings from Last 3 Encounters:  08/29/15 142 lb (64.411 kg)  08/09/15 141 lb (63.957 kg)  08/06/15 147 lb 1.6 oz (66.724 kg)     Past Medical History  Diagnosis Date  . Irregular heart beat   . Depression   . Stroke [facility]) 2006  . Cancer ([place])     skin  . Carotid stenosis     s/p R CEA 2006  . Cataract     s/p surgery 2007  . Memory loss   . Diabetes mellitus without complication ([place])   . Hypertension     Current Outpatient Prescriptions  Medication Sig Dispense Refill  . aspirin 81 MG chewable tablet Chew 1 tablet (81 mg total) by mouth daily. 90 tablet 0  . atorvastatin (LIPITOR) 80 MG tablet Take 1 tablet (80 mg total) by mouth daily at 6 PM. 30 tablet 0  . b complex vitamins tablet Take 1 tablet by mouth daily.    . Coenzyme Q10 (CO Q-10) 100 MG CAPS Take 1 capsule by mouth daily.     Marland Kitchen donepezil (ARICEPT) 5 MG tablet TAKE 1 TABLET (5 MG TOTAL) BY MOUTH AT BEDTIME. 30 tablet 3  . enalapril (VASOTEC) 2.5 MG tablet TAKE 1 TABLET BY MOUTH  EVERY DAY 90 tablet 3  . Glucos-MSM-C-Mn-Ginger-Willow (MSM GLUCOSAMINE COMPLEX PO) Take 1 tablet by mouth daily. Reported on 08/03/2015    . Incontinence Supplies MISC Use as directed 1 each prn  . Incontinence Supply Disposable (REALITY INCONTINENT BRIEFS SM) MISC Use as needed. 1 each prn  . JANUVIA 100 MG tablet TAKE 1 TABLET BY MOUTH EVERY DAY 30 tablet 2  . metFORMIN (GLUCOPHAGE) 850 MG tablet TAKE 1 TABLET BY MOUTH TWICE A DAY WITH A MEAL 60 tablet 5  . metoprolol tartrate (LOPRESSOR) 25 MG tablet Take 0.5 tablets (12.5 mg total) by mouth 2 (two) times daily. 30 tablet 0  . Multiple Vitamins-Minerals (MULTIVITAMIN WITH MINERALS) tablet Take 1 tablet by mouth every evening.     .  Nutritional Supplements (GLUCERNA ADVANCE SHAKE) LIQD Take 1 Bottle by mouth daily. 12 Bottle 6  . Omega-3 Fatty Acids (FISH OIL) 1000 MG CAPS Take 1 capsule by mouth daily.    . QUEtiapine (SEROQUEL) 50 MG tablet Take 1 tab at bed time (Patient taking differently: Take 50 mg by mouth at bedtime. Take 1 tab at bed time) 30 tablet 1  . rOPINIRole (REQUIP) 1 MG tablet Take 3 tablets (3 mg total) by mouth at bedtime. 180 tablet 3  . sertraline (ZOLOFT) 100 MG tablet Take 1 and 1/2 tab daily (Patient taking differently: Take 150 mg by mouth daily. Take 1 and 1/2 tab daily) 45 tablet 1  . ticagrelor (BRILINTA) 90 MG TABS tablet Take 1 tablet (90 mg total) by mouth 2 (two) times daily. 60 tablet 0  . ticagrelor (BRILINTA) 90 MG TABS tablet Take 1 tablet (90 mg total) by mouth 2 (two) times daily. 60 tablet 10  . [DISCONTINUED] rOPINIRole (REQUIP) 1 MG tablet Take 1 tablet (1 mg total) by mouth at bedtime. 30 tablet 3   No current facility-administered medications for this visit.    Allergies:    Allergies  Allergen Reactions  . Codeine     REACTION: nausea/vomiting  . Neosporin [Neomycin-Bacitracin Zn-Polymyx]   . Petroleum Jelly [Petrolatum]   . Caine-1 [Lidocaine Hcl] Rash  . Clindamycin/Lincomycin Swelling  . Isothiazolinone Chloride Rash  . Neomycin Sulfate Rash  . Phenylene Rash    Phenylenediamine  . Quaternium-15 Rash    Social History:  The patient  reports that she has never smoked. She has never used smokeless tobacco. She reports that she does not drink alcohol or use illicit drugs.   Family history:   Family History  Problem Relation Age of Onset  . Cancer Mother     Breast  . Aneurysm Father   . Cancer Sister     lung cancer  . Cancer Daughter     colon cancer  . Diabetes Maternal Grandmother   . Cancer Maternal Grandfather     lung  . Cancer Paternal Grandmother     mouth     ROS:  Please see the history of present illness.  All other systems reviewed and  negative.   PHYSICAL EXAM: VS:  BP 90/40 mmHg  Pulse 68  Ht 5\' 3"  (1.6 m)  Wt 142 lb (64.411 kg)  BMI 25.16 kg/m2 Well nourished, well developed, in no acute distress HEENT: Pupils are equal round react to light accommodation extraocular movements are intact.  Neck: no JVDNo cervical lymphadenopathy. Cardiac: Regular rate and rhythm without murmurs rubs or gallops. Lungs:  clear to auscultation bilaterally, no wheezing, rhonchi or rales Abd: soft, nontender, positive bowel sounds all quadrants, no  hepatosplenomegaly Ext: no lower extremity edema.  2+ radial and dorsalis pedis pulses. Skin: warm and dry Neuro:  Grossly normal    ASSESSMENT AND PLAN:  Problem List Items Addressed This Visit    Hypotension   Hyperlipidemia with target LDL less than 70 (Chronic)   Essential hypertension (Chronic)   Diastolic HF (heart failure) (HCC) (Chronic)   CAD (coronary artery disease) - Primary   Relevant Orders   AMB referral to cardiac rehabilitation      I suspect Mrs. Alkhatib is mildly dehydrated given she had 6-7 very loose bowel movements yesterday. Recommend she drink extra fluids when she gets home. She is currently on Lipitor 80 mg her cholesterol. She appears euvolemic on exam. She is taking her aspirin and Brilinta as directed. She is also on a low-dose beta blocker and enalapril 2.5 mg. I think what she restores her fluid she want have any problem with these medications.  She also needs to be referred to stage II cardiac rehabilitation for specific direction and monitoring for exercise routines.

## 2015-09-05 ENCOUNTER — Telehealth: Payer: Self-pay

## 2015-09-05 ENCOUNTER — Other Ambulatory Visit: Payer: Self-pay | Admitting: Internal Medicine

## 2015-09-05 NOTE — Telephone Encounter (Signed)
Dr.Murphy @ [facility] is requesting cardiac clearance for pt to HOLD Brilinta and have a right shoulder scope with rotator cuff repair on 10/07/15.  rqst fwd to Dr.Smith to adv

## 2015-09-06 NOTE — Telephone Encounter (Signed)
Rx filled.  Algis Greenhouse. Jerline Pain, [facility] Resident PGY-2 09/06/2015 9:37 AM

## 2015-09-07 NOTE — Telephone Encounter (Signed)
The patient recently had a drug-eluting stent placed. She will be unable to undergo rotator cuff procedure for at least 6 months.

## 2015-09-08 ENCOUNTER — Telehealth: Payer: Self-pay | Admitting: Family Medicine

## 2015-09-08 NOTE — Telephone Encounter (Signed)
Claiborne Billings @ Dr.Wainer aware of Dr.Smith's response. The patient recently had a drug-eluting stent placed. She will be unable to undergo rotator cuff procedure for at least 6 months.  Response also faxed to [facility] attn fax # [phone]

## 2015-09-08 NOTE — Telephone Encounter (Signed)
Pt called because she is having a lot of side effects from the Metformin. She is taking a higher dosage than before and she now has diarrhea all the time. She also would like to speak to [place]. She said that Jazmin called her yesterday and would like to know what she wants. jw

## 2015-09-12 NOTE — Telephone Encounter (Signed)
lmovm for pt to return call. Fleeger, Jessica Dawn, CMA  

## 2015-09-14 NOTE — Telephone Encounter (Signed)
Diarrhea is a common reaction to metformin, however it usually only last for a few weeks after starting or increasing the dose. If she is still having problems, she should make an appointment. I would like for her to stay on the metformin unless the diarrhea is absolutely intolerable.  Algis Greenhouse. Jerline Pain, [place] Resident PGY-2 09/14/2015 9:48 AM

## 2015-09-14 NOTE — Telephone Encounter (Signed)
If she is having blood in her urine she should be seen sooner than May 1.  Algis Greenhouse. Jerline Pain, [place] [facility] Resident PGY-2 09/14/2015 12:09 PM

## 2015-09-14 NOTE — Telephone Encounter (Signed)
Will forward to MD to advise regarding diarrhea.  I'm not sure what call patient is referring to.  [facility]

## 2015-09-14 NOTE — Telephone Encounter (Signed)
  Spoke with patient and she is aware of this.  States that she has been on this increased dose for over a month and also has been experiencing blood in her urine.  Informed patient that next available appt is may 1 and would see if patient can be squeezed in before then or if she can see another provider. Please advise. Payge Eppes,CMA

## 2015-09-14 NOTE — Telephone Encounter (Signed)
Patient scheduled for tomorrow with Dr. Lonny Prude at 1:45pm. Brittany Chung

## 2015-09-15 ENCOUNTER — Ambulatory Visit: Payer: Self-pay | Admitting: Family Medicine

## 2015-09-19 ENCOUNTER — Ambulatory Visit: Payer: Commercial Managed Care - HMO | Admitting: Family Medicine

## 2015-09-20 ENCOUNTER — Ambulatory Visit: Payer: Self-pay | Admitting: Family Medicine

## 2015-09-21 ENCOUNTER — Other Ambulatory Visit (HOSPITAL_COMMUNITY): Payer: Self-pay | Admitting: Psychiatry

## 2015-09-23 ENCOUNTER — Ambulatory Visit (HOSPITAL_COMMUNITY): Payer: Self-pay | Admitting: Psychiatry

## 2015-10-01 ENCOUNTER — Other Ambulatory Visit: Payer: Self-pay | Admitting: Family Medicine

## 2015-10-03 ENCOUNTER — Encounter (HOSPITAL_COMMUNITY): Payer: Self-pay | Admitting: Psychiatry

## 2015-10-03 ENCOUNTER — Ambulatory Visit (INDEPENDENT_AMBULATORY_CARE_PROVIDER_SITE_OTHER): Payer: Commercial Managed Care - HMO | Admitting: Psychiatry

## 2015-10-03 VITALS — BP 116/68 | HR 58 | Ht 63.5 in | Wt 139.8 lb

## 2015-10-03 DIAGNOSIS — F310 Bipolar disorder, current episode hypomanic: Secondary | ICD-10-CM | POA: Diagnosis not present

## 2015-10-03 MED ORDER — SERTRALINE HCL 100 MG PO TABS: 100.0000 mg | ORAL_TABLET | Freq: Every day | ORAL | Status: DC

## 2015-10-03 MED ORDER — BUPROPION HCL ER (XL) 150 MG PO TB24: 150.0000 mg | ORAL_TABLET | ORAL | Status: DC

## 2015-10-03 NOTE — Telephone Encounter (Signed)
Rx filled.  Algis Greenhouse. Jerline Pain, [place] Resident PGY-2 10/03/2015 11:04 AM

## 2015-10-03 NOTE — Progress Notes (Signed)
[place] Progress Note  Brittany Chung [id number] 75 y.o.  10/03/2015 4:24 PM  Chief Complaint:  I am feeling more depressed and sad.  I'm sleeping too much.  I did not see a therapist.                 History of Present Illness:  Brittany Chung came for her followup appointment with her daughter.  She was admitted on the medical floor because of chest pain and cardiac issues.  Since that she's been feeling more depressed and sad.  She sleeping too much.  Her daughter endorse that she has no energy and she requires a lot of encouragement and motivation to do things.  Patient admitted that she is feeling tired and lately losing interest in her daily activities.  Her stressors are his son who is still in prison.  Patient told that one of his close family member also very sick and not getting better and she is concerned about him.  Patient feels sometimes hopeless and helpless.  She does not feel current medicine is working.  Since started Seroquel she's been sleeping too much.  Though she does not have any mania, irritability, anger but feeling more down and depressed and isolated.  She apologized missing her appointment with a therapist.  However she agreed to be scheduled appointment.  Patient denies drinking alcohol or using any illegal substances.  Her energy level is low.  Her vitals are stable.  She used to take Vyvanse and Ativan which has been stopped due to her cardiac issues. . Suicidal Ideation: No Plan Formed: No Patient has means to carry out plan: No  Homicidal Ideation: No Plan Formed: No Patient has means to carry out plan: No  Medical History; Patient has history of irregular heartbeat, stroke, cardiac stenosis, hypertension and diabetes mellitus .  She goes to [facility].      Past Psychiatric History/Hospitalization(s) Patient denies any history of suicidal attempt, inpatient psychiatric treatment of any psychosis.  As per daughter she has  history of bipolar disorder but she feels embarrassed to accept it.  She was given Seroquel by her primary care physician but she stopped taking it because of sedation.   She was taking Remeron which was stopped when Seroquel was started a few months ago. Anxiety: Yes Bipolar Disorder: Yes Depression: Yes Mania: No Psychosis: No Schizophrenia: No Personality Disorder: No Hospitalization for psychiatric illness: No History of Electroconvulsive Shock Therapy: No Prior Suicide Attempts: No   Review of Systems  Eyes: Negative for blurred vision.  Cardiovascular: Negative for palpitations.  Musculoskeletal: Positive for joint pain.  Skin: Negative for itching and rash.  Neurological: Negative for dizziness and tremors.  Psychiatric/Behavioral: The patient is nervous/anxious.     Psychiatric: Agitation: No Hallucination: No Depressed Mood: No Insomnia: No Hypersomnia: No Altered Concentration: No Feels Worthless: No Grandiose Ideas: No Belief In Special Powers: No New/Increased Substance Abuse: No Compulsions: No  Neurologic: Headache: No Seizure: No Paresthesias: No    Outpatient Encounter Prescriptions as of 10/03/2015  Medication Sig  . atorvastatin (LIPITOR) 80 MG tablet TAKE ONE TABLET BY MOUTH DAILY AT 6 PM  . b complex vitamins tablet Take 1 tablet by mouth daily.  Marland Kitchen buPROPion (WELLBUTRIN XL) 150 MG 24 hr tablet Take 1 tablet (150 mg total) by mouth every morning.  . Coenzyme Q10 (CO Q-10) 100 MG CAPS Take 1 capsule by mouth daily.   . [employer] CHILDRENS ASPIRIN 81 MG chewable tablet  TAKE ONE TABLET BY MOUTH DAILY  . donepezil (ARICEPT) 5 MG tablet TAKE 1 TABLET (5 MG TOTAL) BY MOUTH AT BEDTIME.  . enalapril (VASOTEC) 2.5 MG tablet TAKE 1 TABLET BY MOUTH EVERY DAY  . Glucos-MSM-C-Mn-Ginger-Willow (MSM GLUCOSAMINE COMPLEX PO) Take 1 tablet by mouth daily. Reported on 08/03/2015  . Incontinence Supplies MISC Use as directed  . Incontinence Supply Disposable (REALITY  INCONTINENT BRIEFS SM) MISC Use as needed.  Marland Kitchen JANUVIA 100 MG tablet TAKE 1 TABLET BY MOUTH EVERY DAY  . metFORMIN (GLUCOPHAGE) 850 MG tablet TAKE 1 TABLET BY MOUTH TWICE A DAY WITH A MEAL  . metoprolol tartrate (LOPRESSOR) 25 MG tablet TAKE 0.5 TABLETS BY MOUTH 2 TIMES DAILY.  . Multiple Vitamins-Minerals (MULTIVITAMIN WITH MINERALS) tablet Take 1 tablet by mouth every evening.   . Nutritional Supplements (GLUCERNA ADVANCE SHAKE) LIQD Take 1 Bottle by mouth daily.  . Omega-3 Fatty Acids (FISH OIL) 1000 MG CAPS Take 1 capsule by mouth daily.  . QUEtiapine (SEROQUEL) 50 MG tablet Take 1 tab at bed time (Patient taking differently: Take 50 mg by mouth at bedtime. Take 1 tab at bed time)  . rOPINIRole (REQUIP) 1 MG tablet Take 3 tablets (3 mg total) by mouth at bedtime.  . sertraline (ZOLOFT) 100 MG tablet Take 1 tablet (100 mg total) by mouth daily.  . ticagrelor (BRILINTA) 90 MG TABS tablet Take 1 tablet (90 mg total) by mouth 2 (two) times daily.  . [DISCONTINUED] sertraline (ZOLOFT) 100 MG tablet Take 1 and 1/2 tab daily (Patient taking differently: Take 150 mg by mouth daily. Take 1 and 1/2 tab daily)  . [DISCONTINUED] ticagrelor (BRILINTA) 90 MG TABS tablet Take 1 tablet (90 mg total) by mouth 2 (two) times daily.   No facility-administered encounter medications on file as of 10/03/2015.    Recent Results (from the past 2160 hour(s))  CBC with Differential     Status: Abnormal   Collection Time: 08/03/15  9:24 PM  Result Value Ref Range   WBC 6.8 4.0 - 10.5 K/uL   RBC 4.20 3.87 - 5.11 MIL/uL   Hemoglobin 11.7 (L) 12.0 - 15.0 g/dL   HCT 36.6 36.0 - 46.0 %   MCV 87.1 78.0 - 100.0 fL   MCH 27.9 26.0 - 34.0 pg   MCHC 32.0 30.0 - 36.0 g/dL   RDW 13.8 11.5 - 15.5 %   Platelets 255 150 - 400 K/uL   Neutrophils Relative % 73 %   Neutro Abs 4.9 1.7 - 7.7 K/uL   Lymphocytes Relative 27 %   Lymphs Abs 1.9 0.7 - 4.0 K/uL   Monocytes Relative 0 %   Monocytes Absolute 0.0 (L) 0.1 - 1.0 K/uL    Eosinophils Relative 0 %   Eosinophils Absolute 0.0 0.0 - 0.7 K/uL   Basophils Relative 0 %   Basophils Absolute 0.0 0.0 - 0.1 K/uL  Troponin I     Status: None   Collection Time: 08/03/15  9:24 PM  Result Value Ref Range   Troponin I 0.03 <0.031 ng/mL    Comment:        NO INDICATION OF MYOCARDIAL INJURY.   Comprehensive metabolic panel     Status: Abnormal   Collection Time: 08/03/15  9:24 PM  Result Value Ref Range   Sodium 140 135 - 145 mmol/L   Potassium 4.8 3.5 - 5.1 mmol/L   Chloride 104 101 - 111 mmol/L   CO2 21 (L) 22 - 32 mmol/L   Glucose,  Bld 299 (H) 65 - 99 mg/dL   BUN 33 (H) 6 - 20 mg/dL   Creatinine, Ser 1.45 (H) 0.44 - 1.00 mg/dL   Calcium 9.9 8.9 - 10.3 mg/dL   Total Protein 7.5 6.5 - 8.1 g/dL   Albumin 4.0 3.5 - 5.0 g/dL   AST 28 15 - 41 U/L   ALT 20 14 - 54 U/L   Alkaline Phosphatase 98 38 - 126 U/L   Total Bilirubin 0.7 0.3 - 1.2 mg/dL   GFR calc non Af Amer 34 (L) >60 mL/min   GFR calc Af Amer 39 (L) >60 mL/min    Comment: (NOTE) The eGFR has been calculated using the CKD EPI equation. This calculation has not been validated in all clinical situations. eGFR's persistently <60 mL/min signify possible Chronic Kidney Disease.    Anion gap 15 5 - 15  CBC     Status: Abnormal   Collection Time: 08/04/15 12:05 AM  Result Value Ref Range   WBC 7.4 4.0 - 10.5 K/uL   RBC 3.91 3.87 - 5.11 MIL/uL   Hemoglobin 11.4 (L) 12.0 - 15.0 g/dL   HCT 34.0 (L) 36.0 - 46.0 %   MCV 87.0 78.0 - 100.0 fL   MCH 29.2 26.0 - 34.0 pg   MCHC 33.5 30.0 - 36.0 g/dL   RDW 13.8 11.5 - 15.5 %   Platelets 231 150 - 400 K/uL  Creatinine, serum     Status: Abnormal   Collection Time: 08/04/15 12:05 AM  Result Value Ref Range   Creatinine, Ser 1.22 (H) 0.44 - 1.00 mg/dL   GFR calc non Af Amer 42 (L) >60 mL/min   GFR calc Af Amer 49 (L) >60 mL/min    Comment: (NOTE) The eGFR has been calculated using the CKD EPI equation. This calculation has not been validated in all clinical  situations. eGFR's persistently <60 mL/min signify possible Chronic Kidney Disease.   Glucose, capillary     Status: Abnormal   Collection Time: 08/04/15 12:07 AM  Result Value Ref Range   Glucose-Capillary 186 (H) 65 - 99 mg/dL  Glucose, capillary     Status: Abnormal   Collection Time: 08/04/15  5:54 AM  Result Value Ref Range   Glucose-Capillary 167 (H) 65 - 99 mg/dL  Troponin I (q 6hr x 3)     Status: Abnormal   Collection Time: 08/04/15  7:09 AM  Result Value Ref Range   Troponin I 0.33 (H) <0.031 ng/mL    Comment:        PERSISTENTLY INCREASED TROPONIN VALUES IN THE RANGE OF 0.04-0.49 ng/mL CAN BE SEEN IN:       -UNSTABLE ANGINA       -CONGESTIVE HEART FAILURE       -MYOCARDITIS       -CHEST TRAUMA       -ARRYHTHMIAS       -LATE PRESENTING MYOCARDIAL INFARCTION       -COPD   CLINICAL FOLLOW-UP RECOMMENDED.   Glucose, capillary     Status: Abnormal   Collection Time: 08/04/15 12:19 PM  Result Value Ref Range   Glucose-Capillary 167 (H) 65 - 99 mg/dL  Troponin I (q 6hr x 3)     Status: Abnormal   Collection Time: 08/04/15 12:38 PM  Result Value Ref Range   Troponin I 0.25 (H) <0.031 ng/mL    Comment:        PERSISTENTLY INCREASED TROPONIN VALUES IN THE RANGE OF 0.04-0.49 ng/mL CAN BE SEEN  IN:       -UNSTABLE ANGINA       -CONGESTIVE HEART FAILURE       -MYOCARDITIS       -CHEST TRAUMA       -ARRYHTHMIAS       -LATE PRESENTING MYOCARDIAL INFARCTION       -COPD   CLINICAL FOLLOW-UP RECOMMENDED.   Glucose, capillary     Status: Abnormal   Collection Time: 08/04/15  3:50 PM  Result Value Ref Range   Glucose-Capillary 156 (H) 65 - 99 mg/dL  Troponin I (q 6hr x 3)     Status: Abnormal   Collection Time: 08/04/15  6:48 PM  Result Value Ref Range   Troponin I 0.29 (H) <0.031 ng/mL    Comment:        PERSISTENTLY INCREASED TROPONIN VALUES IN THE RANGE OF 0.04-0.49 ng/mL CAN BE SEEN IN:       -UNSTABLE ANGINA       -CONGESTIVE HEART FAILURE        -MYOCARDITIS       -CHEST TRAUMA       -ARRYHTHMIAS       -LATE PRESENTING MYOCARDIAL INFARCTION       -COPD   CLINICAL FOLLOW-UP RECOMMENDED.   Heparin level (unfractionated)     Status: Abnormal   Collection Time: 08/04/15  7:34 PM  Result Value Ref Range   Heparin Unfractionated 1.07 (H) 0.30 - 0.70 IU/mL    Comment:        IF HEPARIN RESULTS ARE BELOW EXPECTED VALUES, AND PATIENT DOSAGE HAS BEEN CONFIRMED, SUGGEST FOLLOW UP TESTING OF ANTITHROMBIN III LEVELS.   Glucose, capillary     Status: Abnormal   Collection Time: 08/04/15  8:51 PM  Result Value Ref Range   Glucose-Capillary 120 (H) 65 - 99 mg/dL  Comprehensive metabolic panel     Status: Abnormal   Collection Time: 08/05/15  3:45 AM  Result Value Ref Range   Sodium 140 135 - 145 mmol/L   Potassium 4.8 3.5 - 5.1 mmol/L   Chloride 107 101 - 111 mmol/L   CO2 23 22 - 32 mmol/L   Glucose, Bld 165 (H) 65 - 99 mg/dL   BUN 27 (H) 6 - 20 mg/dL   Creatinine, Ser 1.15 (H) 0.44 - 1.00 mg/dL   Calcium 9.3 8.9 - 10.3 mg/dL   Total Protein 7.1 6.5 - 8.1 g/dL   Albumin 3.7 3.5 - 5.0 g/dL   AST 28 15 - 41 U/L   ALT 20 14 - 54 U/L   Alkaline Phosphatase 73 38 - 126 U/L   Total Bilirubin 0.5 0.3 - 1.2 mg/dL   GFR calc non Af Amer 45 (L) >60 mL/min   GFR calc Af Amer 52 (L) >60 mL/min    Comment: (NOTE) The eGFR has been calculated using the CKD EPI equation. This calculation has not been validated in all clinical situations. eGFR's persistently <60 mL/min signify possible Chronic Kidney Disease.    Anion gap 10 5 - 15  Protime-INR     Status: None   Collection Time: 08/05/15  3:45 AM  Result Value Ref Range   Prothrombin Time 14.2 11.6 - 15.2 seconds   INR 1.08 0.00 - 1.49  Heparin level (unfractionated)     Status: Abnormal   Collection Time: 08/05/15  3:45 AM  Result Value Ref Range   Heparin Unfractionated 0.89 (H) 0.30 - 0.70 IU/mL    Comment:  IF HEPARIN RESULTS ARE BELOW EXPECTED VALUES, AND  PATIENT DOSAGE HAS BEEN CONFIRMED, SUGGEST FOLLOW UP TESTING OF ANTITHROMBIN III LEVELS.   Glucose, capillary     Status: Abnormal   Collection Time: 08/05/15  6:31 AM  Result Value Ref Range   Glucose-Capillary 121 (H) 65 - 99 mg/dL  Glucose, capillary     Status: Abnormal   Collection Time: 08/05/15 11:22 AM  Result Value Ref Range   Glucose-Capillary 118 (H) 65 - 99 mg/dL  POCT Activated clotting time     Status: None   Collection Time: 08/05/15  1:16 PM  Result Value Ref Range   Activated Clotting Time 374 seconds  Glucose, capillary     Status: Abnormal   Collection Time: 08/05/15  2:23 PM  Result Value Ref Range   Glucose-Capillary 122 (H) 65 - 99 mg/dL   Comment 1 Notify RN   Glucose, capillary     Status: Abnormal   Collection Time: 08/05/15  5:53 PM  Result Value Ref Range   Glucose-Capillary 145 (H) 65 - 99 mg/dL   Comment 1 Notify RN   Urinalysis, Routine w reflex microscopic (not at [facility])     Status: Abnormal   Collection Time: 08/05/15  8:22 PM  Result Value Ref Range   Color, Urine RED (A) YELLOW    Comment: BIOCHEMICALS MAY BE AFFECTED BY COLOR   APPearance TURBID (A) CLEAR   Specific Gravity, Urine 1.031 (H) 1.005 - 1.030   pH 5.5 5.0 - 8.0   Glucose, UA NEGATIVE NEGATIVE mg/dL   Hgb urine dipstick LARGE (A) NEGATIVE   Bilirubin Urine NEGATIVE NEGATIVE   Ketones, ur 15 (A) NEGATIVE mg/dL   Protein, ur 30 (A) NEGATIVE mg/dL   Nitrite NEGATIVE NEGATIVE   Leukocytes, UA MODERATE (A) NEGATIVE  Urine microscopic-add on     Status: Abnormal   Collection Time: 08/05/15  8:22 PM  Result Value Ref Range   Squamous Epithelial / LPF 0-5 (A) NONE SEEN   WBC, UA 6-30 0 - 5 WBC/hpf   RBC / HPF TOO NUMEROUS TO COUNT 0 - 5 RBC/hpf   Bacteria, UA RARE (A) NONE SEEN  Glucose, capillary     Status: Abnormal   Collection Time: 08/05/15  9:08 PM  Result Value Ref Range   Glucose-Capillary 143 (H) 65 - 99 mg/dL  CBC     Status: Abnormal   Collection Time: 08/06/15  12:35 AM  Result Value Ref Range   WBC 14.9 (H) 4.0 - 10.5 K/uL   RBC 3.96 3.87 - 5.11 MIL/uL   Hemoglobin 11.5 (L) 12.0 - 15.0 g/dL   HCT 33.9 (L) 36.0 - 46.0 %   MCV 85.6 78.0 - 100.0 fL   MCH 29.0 26.0 - 34.0 pg   MCHC 33.9 30.0 - 36.0 g/dL   RDW 13.8 11.5 - 15.5 %   Platelets 217 150 - 400 K/uL  Basic metabolic panel     Status: Abnormal   Collection Time: 08/06/15  4:57 AM  Result Value Ref Range   Sodium 139 135 - 145 mmol/L   Potassium 4.8 3.5 - 5.1 mmol/L   Chloride 106 101 - 111 mmol/L   CO2 21 (L) 22 - 32 mmol/L   Glucose, Bld 165 (H) 65 - 99 mg/dL   BUN 23 (H) 6 - 20 mg/dL   Creatinine, Ser 1.15 (H) 0.44 - 1.00 mg/dL   Calcium 10.0 8.9 - 10.3 mg/dL   GFR calc non Af Amer 45 (L) >  60 mL/min   GFR calc Af Amer 52 (L) >60 mL/min    Comment: (NOTE) The eGFR has been calculated using the CKD EPI equation. This calculation has not been validated in all clinical situations. eGFR's persistently <60 mL/min signify possible Chronic Kidney Disease.    Anion gap 12 5 - 15  CBC     Status: Abnormal   Collection Time: 08/06/15  4:57 AM  Result Value Ref Range   WBC 14.8 (H) 4.0 - 10.5 K/uL   RBC 4.18 3.87 - 5.11 MIL/uL   Hemoglobin 11.7 (L) 12.0 - 15.0 g/dL   HCT 36.2 36.0 - 46.0 %   MCV 86.6 78.0 - 100.0 fL   MCH 28.0 26.0 - 34.0 pg   MCHC 32.3 30.0 - 36.0 g/dL   RDW 13.9 11.5 - 15.5 %   Platelets 259 150 - 400 K/uL  Glucose, capillary     Status: Abnormal   Collection Time: 08/06/15  6:06 AM  Result Value Ref Range   Glucose-Capillary 143 (H) 65 - 99 mg/dL  POCT urinalysis dipstick     Status: Abnormal   Collection Time: 08/09/15 11:23 AM  Result Value Ref Range   Color, UA YELLOW    Clarity, UA CLEAR    Glucose, UA NEG    Bilirubin, UA NEG    Ketones, UA NEG    Spec Grav, UA 1.015    Blood, UA LARGE    pH, UA 6.0    Protein, UA NEG    Urobilinogen, UA 0.2    Nitrite, UA NEG    Leukocytes, UA Negative Negative  POCT UA - Microscopic Only     Status:  Abnormal   Collection Time: 08/09/15 11:23 AM  Result Value Ref Range   WBC, Ur, HPF, POC OCCASIONAL    RBC, urine, microscopic TOO MANY TO COUNT    Bacteria, U Microscopic RARE    Epithelial cells, urine per micros NONE       Physical Exam: Constitutional:  BP 116/68 mmHg  Pulse 58  Ht 5' 3.5" (1.613 m)  Wt 139 lb 12.8 oz (63.413 kg)  BMI 24.37 kg/m2  Musculoskeletal: Strength & Muscle Tone: within normal limits Gait & Station: normal Patient leans: Patient has a normal posture  Mental Status Examination;  Patient is groomed and well dressed female.  She maintained fair eye contact.  She described her mood anxious and Depressed.  Her affect is constricted. Her speech is fast but clear and coherent. Her attention concentration is fair.  She denies any auditory or visual hallucination.  She denies any active or passive suicidal thoughts or homicidal thoughts.  There were no paranoia or any delusions present at this time.  Her fund of knowledge is average.  Her thought process is circumstantial.  There were no flight of ideas or loose association.  Her psychomotor activity is increased. She is alert and oriented x3 however difficulty remembering things..  Her insight judgment and impulse control is okay.   Review of Psycho-Social Stressors (1), Review or order clinical lab tests (1), Established Problem, Worsening (2), Review of Last Therapy Session (1), Review of Medication Regimen & Side Effects (2) and Review of New Medication or Change in Dosage (2)  Assessment: Axis I: ADD inattentive type, bipolar disorder type I, major depressive disorder recurrent mild, cognitive disorder NOS  Axis II: Deferred  Axis III:  Past Medical History  Diagnosis Date  . Irregular heart beat   . Depression   . Stroke (  [place]) 2006  . Cancer ([place])     skin  . Carotid stenosis     s/p R CEA 2006  . Cataract     s/p surgery 2007  . Memory loss   . Diabetes mellitus without complication ([place])    . Hypertension    Plan:  I reviewed records from Recent hospitalization including blood work.  Her creatinine is 1.1.  BUN is 23.  Reinforced to see a therapist for counseling.  We will try Wellbutrin XL 150 mg daily and cut down her Zoloft 100 mg daily.  I also suggested to decrease Seroquel 25 mg at bedtime for one week and gradually stop as this medicine may be making her very sleepy.  Discussed medication side effects and benefits.  Discussed psychosocial issues related to her son.  Recommended to call us back if she has any question or any concern.  Follow-up in 6 weeks.    Brayla Pat T., MD 10/03/2015

## 2015-10-04 ENCOUNTER — Other Ambulatory Visit (HOSPITAL_COMMUNITY): Payer: Self-pay | Admitting: Psychiatry

## 2015-10-04 NOTE — Telephone Encounter (Signed)
Declined refill request for Zoloft as was the incorrect dosage following Dr. [name] evaluation 10/03/15.  Informed Dr. Adele Schilder had sent a new order that date.

## 2015-10-07 ENCOUNTER — Ambulatory Visit (HOSPITAL_BASED_OUTPATIENT_CLINIC_OR_DEPARTMENT_OTHER): Admit: 2015-10-07 | Payer: Commercial Managed Care - HMO | Admitting: Orthopedic Surgery

## 2015-10-07 ENCOUNTER — Encounter (HOSPITAL_BASED_OUTPATIENT_CLINIC_OR_DEPARTMENT_OTHER): Payer: Self-pay

## 2015-10-07 SURGERY — SHOULDER ARTHROSCOPY WITH SUBACROMIAL DECOMPRESSION, ROTATOR CUFF REPAIR AND BICEP TENDON REPAIR
Anesthesia: General | Site: Shoulder | Laterality: Right

## 2015-10-17 ENCOUNTER — Telehealth: Payer: Self-pay | Admitting: Family Medicine

## 2015-10-17 NOTE — Telephone Encounter (Signed)
Daughter called to say that Brittany Chung's metformin is causing stomach issues.  Want to change to different medication.

## 2015-10-17 NOTE — Telephone Encounter (Signed)
Will forward to MD to advise. Jazmin Hartsell,CMA  

## 2015-10-17 NOTE — Telephone Encounter (Signed)
Patient can stop the metformin to see if it helps with the diarrhea. I would like for her to be on it if possible, but if it is absolutely intolerable she should stop. She needs to make a follow up appointment to discuss her diabetes.   Algis Greenhouse. Jerline Pain, [place] Resident PGY-2 10/17/2015 11:27 AM

## 2015-10-18 ENCOUNTER — Telehealth: Payer: Self-pay | Admitting: Family Medicine

## 2015-10-18 NOTE — Telephone Encounter (Signed)
Discussed with pts daughter concerns with metformin, ok to quit if intolerable, f/u apt made for June.

## 2015-10-18 NOTE — Telephone Encounter (Signed)
Left message for pt to call me back to discuss her medication and schedule apt. Page, cma.

## 2015-10-18 NOTE — Telephone Encounter (Signed)
Daughter returned call to office to speak with provider or CMA regarding her mother's medication for dm.   Please call her back to discuss.

## 2015-10-24 ENCOUNTER — Other Ambulatory Visit: Payer: Self-pay | Admitting: Neurology

## 2015-10-24 NOTE — Telephone Encounter (Signed)
RX refused. Not seen since 10/13/14

## 2015-10-26 ENCOUNTER — Encounter: Payer: Self-pay | Admitting: Neurology

## 2015-10-26 ENCOUNTER — Ambulatory Visit (INDEPENDENT_AMBULATORY_CARE_PROVIDER_SITE_OTHER): Payer: Medicare HMO | Admitting: Neurology

## 2015-10-26 VITALS — BP 100/60 | HR 66 | Ht 63.5 in | Wt 140.4 lb

## 2015-10-26 DIAGNOSIS — I679 Cerebrovascular disease, unspecified: Secondary | ICD-10-CM

## 2015-10-26 DIAGNOSIS — G3184 Mild cognitive impairment, so stated: Secondary | ICD-10-CM | POA: Diagnosis not present

## 2015-10-26 DIAGNOSIS — R296 Repeated falls: Secondary | ICD-10-CM | POA: Diagnosis not present

## 2015-10-26 NOTE — Progress Notes (Signed)
NEUROLOGY FOLLOW UP OFFICE NOTE  LUNDON BRANNUM MD:[id number]  HISTORY OF PRESENT ILLNESS: Brittany Chung is a 75 year old right-handed woman with depression, Bipolar disorder, type 2 diabetes, hyperlipidemia and history of TIAs and carotid artery disease who follows up for mild cognitive impairment and cerebrovascular disease.    UPDATE: MRI of brain without contrast from 10/26/14 was personally reviewed and showed moderate chronic small vessel ischemic changes with remote lacunar infarcts in the right lenticular nucleus and left corona radiata.    She was hospitalized for presumed TIA presenting as weakness and slurred speech on 06/03/15.  MRI and MRA of head were personally reviewed and showed no acute stroke.  Carotid doppler showed no hemodynamically significant stenosis.  ECHO showed EF 60-65% with no cardiac source of emboli.  LDL was 57.  ASA was increased from 81mg  daily to 325mg  daily.  She was then admitted to the hospital on 08/03/15 for acute coronary syndrome, presenting as chest pain.  She has a stent and was placed on Brilinta.  Since the heart attack, she has been more unsteady on her feet and has had frequent falls.  Her last fall was 2 weeks ago.  She fractured her shoulder.  She is taking Aricept 10mg  daily.  Overall, memory is stable.  She   HISTORY: She began having memory problems about 3 years ago.  It has gradually gotten worse over time.  She reports word-finding difficulties.  She misplaces objects such as her keys or dentures.  In the past, she used to become disoriented while driving on familiar routes, but not so much anymore.  She frequently would forget to pay her bills on time, so her daughter watches over things.  However, she never got used to paying the bills since her husband passed away.  Now, her family has to do it for her.  She denies problems recognizing people.  She notes some difficulty recalling names of past friends whom she hasn't seen in years.  She is  able to perform all of her ADLs.  She lives with her daughter and her family.  She denies hallucinations or delusions.  There is no known family history of dementia, however her parents passed away at a young age.  She had a right CEA about 9 years ago.   She has her GED.  She took four years of college but did not graduate.  PAST MEDICAL HISTORY: Past Medical History  Diagnosis Date  . Irregular heart beat   . Depression   . Stroke [facility]) 2006  . Cancer ([place])     skin  . Carotid stenosis     s/p R CEA 2006  . Cataract     s/p surgery 2007  . Memory loss   . Diabetes mellitus without complication ([place])   . Hypertension     MEDICATIONS: Current Outpatient Prescriptions on File Prior to Visit  Medication Sig Dispense Refill  . atorvastatin (LIPITOR) 80 MG tablet TAKE ONE TABLET BY MOUTH DAILY AT 6 PM 30 tablet 2  . b complex vitamins tablet Take 1 tablet by mouth daily.    Marland Kitchen buPROPion (WELLBUTRIN XL) 150 MG 24 hr tablet Take 1 tablet (150 mg total) by mouth every morning. 30 tablet 1  . Coenzyme Q10 (CO Q-10) 100 MG CAPS Take 1 capsule by mouth daily.     . [facility] CHILDRENS ASPIRIN 81 MG chewable tablet TAKE ONE TABLET BY MOUTH DAILY 90 tablet 3  . donepezil (ARICEPT) 5 MG  tablet TAKE 1 TABLET (5 MG TOTAL) BY MOUTH AT BEDTIME. 30 tablet 3  . enalapril (VASOTEC) 2.5 MG tablet TAKE 1 TABLET BY MOUTH EVERY DAY 90 tablet 3  . Glucos-MSM-C-Mn-Ginger-Willow (MSM GLUCOSAMINE COMPLEX PO) Take 1 tablet by mouth daily. Reported on 08/03/2015    . Incontinence Supplies MISC Use as directed 1 each prn  . Incontinence Supply Disposable (REALITY INCONTINENT BRIEFS SM) MISC Use as needed. 1 each prn  . JANUVIA 100 MG tablet TAKE 1 TABLET BY MOUTH EVERY DAY 30 tablet 2  . metFORMIN (GLUCOPHAGE) 850 MG tablet TAKE 1 TABLET BY MOUTH TWICE A DAY WITH A MEAL 60 tablet 5  . metoprolol tartrate (LOPRESSOR) 25 MG tablet TAKE 0.5 TABLETS BY MOUTH 2 TIMES DAILY. 30 tablet 2  . Multiple Vitamins-Minerals  (MULTIVITAMIN WITH MINERALS) tablet Take 1 tablet by mouth every evening.     . Nutritional Supplements (GLUCERNA ADVANCE SHAKE) LIQD Take 1 Bottle by mouth daily. 12 Bottle 6  . Omega-3 Fatty Acids (FISH OIL) 1000 MG CAPS Take 1 capsule by mouth daily.    . QUEtiapine (SEROQUEL) 50 MG tablet Take 1 tab at bed time (Patient taking differently: Take 50 mg by mouth at bedtime. Take 1 tab at bed time) 30 tablet 1  . rOPINIRole (REQUIP) 1 MG tablet Take 3 tablets (3 mg total) by mouth at bedtime. 180 tablet 3  . sertraline (ZOLOFT) 100 MG tablet Take 1 tablet (100 mg total) by mouth daily. 30 tablet 1  . ticagrelor (BRILINTA) 90 MG TABS tablet Take 1 tablet (90 mg total) by mouth 2 (two) times daily. 60 tablet 0  . [DISCONTINUED] rOPINIRole (REQUIP) 1 MG tablet Take 1 tablet (1 mg total) by mouth at bedtime. 30 tablet 3   No current facility-administered medications on file prior to visit.    ALLERGIES: Allergies  Allergen Reactions  . Codeine     REACTION: nausea/vomiting  . Neosporin [Neomycin-Bacitracin Zn-Polymyx]   . Petroleum Jelly [Petrolatum]   . Caine-1 [Lidocaine Hcl] Rash  . Clindamycin/Lincomycin Swelling  . Isothiazolinone Chloride Rash  . Neomycin Sulfate Rash  . Phenylene Rash    Phenylenediamine  . Quaternium-15 Rash    FAMILY HISTORY: Family History  Problem Relation Age of Onset  . Cancer Mother     Breast  . Aneurysm Father   . Cancer Sister     lung cancer  . Cancer Daughter     colon cancer  . Diabetes Maternal Grandmother   . Cancer Maternal Grandfather     lung  . Cancer Paternal Grandmother     mouth     SOCIAL HISTORY: Social History   Social History  . Marital Status: Married    Spouse Name: N/A  . Number of Children: N/A  . Years of Education: N/A   Occupational History  . Not on file.   Social History Main Topics  . Smoking status: Never Smoker   . Smokeless tobacco: Never Used  . Alcohol Use: No  . Drug Use: No  . Sexual Activity:  Not Currently   Other Topics Concern  . Not on file   Social History Narrative   Lives with: daughter and  5 grandchildren   Hobbies: ballroom dance   Religion: Mormon             REVIEW OF SYSTEMS: Constitutional: No fevers, chills, or sweats, no generalized fatigue, change in appetite Eyes: No visual changes, double vision, eye pain Ear, nose and throat: No hearing  loss, ear pain, nasal congestion, sore throat Cardiovascular: No chest pain, palpitations Respiratory:  No shortness of breath at rest or with exertion, wheezes GastrointestinaI: No nausea, vomiting, diarrhea, abdominal pain, fecal incontinence Genitourinary:  No dysuria, urinary retention or frequency Musculoskeletal:  No neck pain, back pain Integumentary: No rash, pruritus, skin lesions Neurological: as above Psychiatric: depression Endocrine: No palpitations, fatigue, diaphoresis, mood swings, change in appetite, change in weight, increased thirst Hematologic/Lymphatic:  No purpura, petechiae. Allergic/Immunologic: no itchy/runny eyes, nasal congestion, recent allergic reactions, rashes  PHYSICAL EXAM: Filed Vitals:   10/26/15 1531  BP: 100/60  Pulse: 66   General: No acute distress.  Patient appears well-groomed.  normal body habitus. Head:  Normocephalic/atraumatic Eyes:  Fundi examined but not visualized Neck: supple, no paraspinal tenderness, full range of motion Heart:  Regular rate and rhythm Lungs:  Clear to auscultation bilaterally Back: No paraspinal tenderness Neurological Exam: alert and oriented to person, place, and time. Attention span and concentration intact, delayed recall 2 of 5 words, remote memory intact, fund of knowledge intact.  Speech fluent and not dysarthric, language intact.   Montreal Cognitive Assessment  10/26/2015 10/13/2014 02/23/2014  Visuospatial/ Executive (0/5) 4 4 3   Naming (0/3) 2 2 3   Attention: Read list of digits (0/2) 2 2 2   Attention: Read list of letters (0/1) 1  1 1   Attention: Serial 7 subtraction starting at 100 (0/3) 1 2 1   Language: Repeat phrase (0/2) 2 2 1   Language : Fluency (0/1) 1 1 1   Abstraction (0/2) 2 2 2   Delayed Recall (0/5) 2 3 4   Orientation (0/6) 6 6 6   Total 23 25 24   Adjusted Score (based on education) 23 25 24    CN II-XII intact. Bulk and tone normal, muscle strength 5/5 throughout.  Sensation to light touch, temperature and vibration intact (except vibration absent in toes on left foot).  Deep tendon reflexes 2+ throughout, toes downgoing.  Finger to nose and heel to shin testing intact.  Gait mildly wide-based, Romberg negative.  IMPRESSION: Mild cognitive impairment, primarily of the amnestic type.   Cerebrovascular disease  PLAN: 1.  Aricept 10mg  at bedtime 2.  On Brilinta.  3.  Lipitor 80mg  daily (LDL at goal of less than 70) 4.  Diabetes control 5.  Mediterranean diet 6.  Home health assessment to evaluate safety and PT need for falls. 7.  Follow up in one year or as needed.  29 minutes spent face to face with patient, over 50% spent discussing management.  Brittany Clines, DO  CC:  Dimas Chyle, MD

## 2015-10-26 NOTE — Patient Instructions (Addendum)
1.  We will continue the donepezil 10mg  at bedtime 2.  Continue the blood thinner 3.  Continue Lipitor 80mg  daily 4.  Will get a Home Health Assessment to evaluate home safety and falls 5.  Mediterranean diet    Why follow it? Research shows. . Those who follow the Mediterranean diet have a reduced risk of heart disease  . The diet is associated with a reduced incidence of Parkinson's and Alzheimer's diseases . People following the diet may have longer life expectancies and lower rates of chronic diseases  . The Dietary Guidelines for Americans recommends the Mediterranean diet as an eating plan to promote health and prevent disease  What Is the Mediterranean Diet?  . Healthy eating plan based on typical foods and recipes of Mediterranean-style cooking . The diet is primarily a plant based diet; these foods should make up a majority of meals   Starches - Plant based foods should make up a majority of meals - They are an important sources of vitamins, minerals, energy, antioxidants, and fiber - Choose whole grains, foods high in fiber and minimally processed items  - Typical grain sources include wheat, oats, barley, corn, brown rice, bulgar, farro, millet, polenta, couscous  - Various types of beans include chickpeas, lentils, fava beans, black beans, white beans   Fruits  Veggies - Large quantities of antioxidant rich fruits & veggies; 6 or more servings  - Vegetables can be eaten raw or lightly drizzled with oil and cooked  - Vegetables common to the traditional Mediterranean Diet include: artichokes, arugula, beets, broccoli, brussel sprouts, cabbage, carrots, celery, collard greens, cucumbers, eggplant, kale, leeks, lemons, lettuce, mushrooms, okra, onions, peas, peppers, potatoes, pumpkin, radishes, rutabaga, shallots, spinach, sweet potatoes, turnips, zucchini - Fruits common to the Mediterranean Diet include: apples, apricots, avocados, cherries, clementines, dates, figs, grapefruits,  grapes, melons, nectarines, oranges, peaches, pears, pomegranates, strawberries, tangerines  Fats - Replace butter and margarine with healthy oils, such as olive oil, canola oil, and tahini  - Limit nuts to no more than a handful a day  - Nuts include walnuts, almonds, pecans, pistachios, pine nuts  - Limit or avoid candied, honey roasted or heavily salted nuts - Olives are central to the [employer] - can be eaten whole or used in a variety of dishes   Meats Protein - Limiting red meat: no more than a few times a month - When eating red meat: choose lean cuts and keep the portion to the size of deck of cards - Eggs: approx. 0 to 4 times a week  - Fish and lean poultry: at least 2 a week  - Healthy protein sources include, chicken, [place], lean beef, lamb - Increase intake of seafood such as tuna, salmon, trout, mackerel, shrimp, scallops - Avoid or limit high fat processed meats such as sausage and bacon  Dairy - Include moderate amounts of low fat dairy products  - Focus on healthy dairy such as fat free yogurt, skim milk, low or reduced fat cheese - Limit dairy products higher in fat such as whole or 2% milk, cheese, ice cream  Alcohol - Moderate amounts of red wine is ok  - No more than 5 oz daily for women (all ages) and men older than age 63  - No more than 10 oz of wine daily for men younger than 13  Other - Limit sweets and other desserts  - Use herbs and spices instead of salt to flavor foods  - Herbs and spices  common to the traditional Mediterranean Diet include: basil, bay leaves, chives, cloves, cumin, fennel, garlic, lavender, marjoram, mint, oregano, parsley, pepper, rosemary, sage, savory, sumac, tarragon, thyme   It's not just a diet, it's a lifestyle:  . The Mediterranean diet includes lifestyle factors typical of those in the region  . Foods, drinks and meals are best eaten with others and savored . Daily physical activity is important for overall good  health . This could be strenuous exercise like running and aerobics . This could also be more leisurely activities such as walking, housework, yard-work, or taking the stairs . Moderation is the key; a balanced and healthy diet accommodates most foods and drinks . Consider portion sizes and frequency of consumption of certain foods   Meal Ideas & Options:  . Breakfast:  o Whole wheat toast or whole wheat English muffins with peanut butter & hard boiled egg o Steel cut oats topped with apples & cinnamon and skim milk  o Fresh fruit: banana, strawberries, melon, berries, peaches  o Smoothies: strawberries, bananas, greek yogurt, peanut butter o Low fat greek yogurt with blueberries and granola  o Egg white omelet with spinach and mushrooms o Breakfast couscous: whole wheat couscous, apricots, skim milk, cranberries  . Sandwiches:  o Hummus and grilled vegetables (peppers, zucchini, squash) on whole wheat bread   o Grilled chicken on whole wheat pita with lettuce, tomatoes, cucumbers or tzatziki  o Tuna salad on whole wheat bread: tuna salad made with greek yogurt, olives, red peppers, capers, green onions o Garlic rosemary lamb pita: lamb sauted with garlic, rosemary, salt & pepper; add lettuce, cucumber, greek yogurt to pita - flavor with lemon juice and black pepper  . Seafood:  o Mediterranean grilled salmon, seasoned with garlic, basil, parsley, lemon juice and black pepper o Shrimp, lemon, and spinach whole-grain pasta salad made with low fat greek yogurt  o Seared scallops with lemon orzo  o Seared tuna steaks seasoned salt, pepper, coriander topped with tomato mixture of olives, tomatoes, olive oil, minced garlic, parsley, green onions and cappers  . Meats:  o Herbed greek chicken salad with kalamata olives, cucumber, feta  o Red bell peppers stuffed with spinach, bulgur, lean ground beef (or lentils) & topped with feta   o Kebabs: skewers of chicken, tomatoes, onions, zucchini,  squash  o [place] burgers: made with red onions, mint, dill, lemon juice, feta cheese topped with roasted red peppers . Vegetarian o Cucumber salad: cucumbers, artichoke hearts, celery, red onion, feta cheese, tossed in olive oil & lemon juice  o Hummus and whole grain pita points with a greek salad (lettuce, tomato, feta, olives, cucumbers, red onion) o Lentil soup with celery, carrots made with vegetable broth, garlic, salt and pepper  o Tabouli salad: parsley, bulgur, mint, scallions, cucumbers, tomato, radishes, lemon juice, olive oil, salt and pepper. Follow up in one year or as needed

## 2015-10-27 ENCOUNTER — Ambulatory Visit (HOSPITAL_COMMUNITY): Payer: Self-pay | Admitting: Psychology

## 2015-10-31 ENCOUNTER — Other Ambulatory Visit: Payer: Self-pay | Admitting: Neurology

## 2015-11-02 ENCOUNTER — Encounter (HOSPITAL_COMMUNITY): Payer: Self-pay | Admitting: Psychology

## 2015-11-02 ENCOUNTER — Ambulatory Visit (HOSPITAL_COMMUNITY): Payer: Self-pay | Admitting: Psychology

## 2015-11-02 NOTE — Progress Notes (Signed)
Brittany Chung is a 75 y.o. female patient referred by Dr. Adele Schilder for counseling.  Pt didn't show for initial appointment.  Provider informed.        Jan Fireman, LPC

## 2015-11-07 ENCOUNTER — Other Ambulatory Visit: Payer: Self-pay

## 2015-11-07 MED ORDER — DONEPEZIL HCL 10 MG PO TABS: 10.0000 mg | ORAL_TABLET | Freq: Every day | ORAL | Status: DC

## 2015-11-08 ENCOUNTER — Encounter: Payer: Self-pay | Admitting: Family Medicine

## 2015-11-08 ENCOUNTER — Ambulatory Visit: Payer: Self-pay | Admitting: Family Medicine

## 2015-11-08 ENCOUNTER — Ambulatory Visit (INDEPENDENT_AMBULATORY_CARE_PROVIDER_SITE_OTHER): Payer: Commercial Managed Care - HMO | Admitting: Family Medicine

## 2015-11-08 VITALS — BP 126/38 | HR 58 | Temp 97.9°F | Ht 63.5 in | Wt 138.6 lb

## 2015-11-08 DIAGNOSIS — R319 Hematuria, unspecified: Secondary | ICD-10-CM | POA: Diagnosis not present

## 2015-11-08 LAB — POCT UA - MICROSCOPIC ONLY

## 2015-11-08 LAB — POCT URINALYSIS DIPSTICK
Bilirubin, UA: NEGATIVE
GLUCOSE UA: NEGATIVE
KETONES UA: NEGATIVE
LEUKOCYTES UA: NEGATIVE
Nitrite, UA: NEGATIVE
Protein, UA: 30
SPEC GRAV UA: 1.025
Urobilinogen, UA: 0.2
pH, UA: 5.5

## 2015-11-08 NOTE — Patient Instructions (Addendum)
Thank you for coming in,   I will call or send a letter with the results from today.   Based on what the results show will determine our plan of treatment.   Please bring all of your medications with you to each visit.   Health maintenance items that are due.  Health Maintenance  Topic Date Due  . Eye exam for diabetics  02/10/1951  . Complete foot exam   03/31/2014  . Pneumonia vaccines (2 of 2 - PCV13) 09/28/2015  . Hemoglobin A1C  12/02/2015  . Flu Shot  01/03/2016  . Tetanus Vaccine  09/30/2016  . DEXA scan (bone density measurement)  Completed  . Shingles Vaccine  Completed     Sign up for My Chart to have easy access to your labs results, and communication with your Primary care physician   Please feel free to call with any questions or concerns at any time, at [phone]. --Dr. Raeford Razor

## 2015-11-08 NOTE — Progress Notes (Signed)
   Subjective:    Patient ID: Brittany Chung, female    DOB: 1940/10/30, 75 y.o.   MRN: [id number]  Seen for Same day visit for   CC: vaginal spotting   Occurs intermittently in nature.  Not associated with any pain.  She has dark brown pigment within her urine but isn't sure if it is associated with her vagina.  She is taking an asa and brilinta after having an MI in March.  Denies any dysuria  Denies being sexually active.  No abdominal pain.  No abnormal pap smear. Reports a history of UTI's with the last one being last year.  Denies a history of kidney stones.  No fevers, night sweats or chills.  She reports a history of 6 children vaginally.  She is reports being incontinent.  Denies any surgeries.    She has had some hematuria and this have been observed in the outpatient setting.   PMH: diastolic HF, HLD, HTN, Type 2 DM, IBS, Bipolar  SH: denies any history of smoking, has some social alcohol use.  FH: daughter with colon cancer, no family history of uterine or cervical cancer   Review of Systems   See HPI for ROS. Objective:  BP 126/38 mmHg  Pulse 58  Temp(Src) 97.9 F (36.6 C) (Oral)  Ht 5' 3.5" (1.613 m)  Wt 138 lb 9.6 oz (62.869 kg)  BMI 24.16 kg/m2  General: NAD Cardiac: RRR, normal heart sounds, no murmurs.  Respiratory: CTAB, normal effort Extremities:  WWP. Skin: warm and dry, no rashes noted GU: External: no lesions, atrophic  Vagina: no blood in vault  Assessment & Plan:   Hematuria She has had ongoing blood that could be from her vagina or her urine.  Has displayed hematuria in the past  Will perform UA and urine culture  If UA is significant for RBC's then will refer her to urology  If [place] is not significant then refer to women's clinic at [facility] for endometrial biopsy.

## 2015-11-09 NOTE — Assessment & Plan Note (Signed)
She has had ongoing blood that could be from her vagina or her urine.  Has displayed hematuria in the past  Will perform UA and urine culture  If UA is significant for RBC's then will refer her to urology  If [place] is not significant then refer to women's clinic at [facility] for endometrial biopsy.

## 2015-11-10 ENCOUNTER — Telehealth: Payer: Self-pay | Admitting: Family Medicine

## 2015-11-10 DIAGNOSIS — R319 Hematuria, unspecified: Secondary | ICD-10-CM

## 2015-11-10 LAB — URINE CULTURE: Colony Count: 15000

## 2015-11-10 NOTE — Telephone Encounter (Signed)
Spoke with daughter about her mother's results. Her urine culture and findings did not suggest an infection as to the reason for the blood in her urine. Because of this results we will send her to urology for evaluation.   Rosemarie Ax, MD PGY-3, [facility] 11/10/2015, 10:15 AM

## 2015-11-15 ENCOUNTER — Ambulatory Visit: Payer: Self-pay | Admitting: Family Medicine

## 2015-11-15 ENCOUNTER — Ambulatory Visit (INDEPENDENT_AMBULATORY_CARE_PROVIDER_SITE_OTHER): Payer: Commercial Managed Care - HMO | Admitting: Psychiatry

## 2015-11-15 ENCOUNTER — Encounter (HOSPITAL_COMMUNITY): Payer: Self-pay | Admitting: Psychiatry

## 2015-11-15 VITALS — BP 110/62 | HR 67 | Ht 63.5 in | Wt 138.0 lb

## 2015-11-15 DIAGNOSIS — F900 Attention-deficit hyperactivity disorder, predominantly inattentive type: Secondary | ICD-10-CM

## 2015-11-15 DIAGNOSIS — F310 Bipolar disorder, current episode hypomanic: Secondary | ICD-10-CM

## 2015-11-15 MED ORDER — BUPROPION HCL ER (XL) 300 MG PO TB24: 300.0000 mg | ORAL_TABLET | ORAL | Status: DC

## 2015-11-15 NOTE — Progress Notes (Signed)
[facility] [phone] Progress Note  Brittany Chung [id number] 75 y.o.  11/15/2015 2:57 PM  Chief Complaint:  I have been sick.  I'm not feeling well.  I'm feeling tired.  But I like Wellbutrin.                   History of Present Illness:  Brittany Chung came for her followup appointment .  Usually she comes with her daughter but today she came by herself as daughter was not available.  She's been complaining a lot of health issues in recent weeks.  She has blood in her urine and she was given some medication.  She admitted feeling tired, exhausted and fatigued.  On her last visit we recommended to try Wellbutrin and reduce Zoloft.  She liked the Wellbutrin but she could not get a better response because she was feeling sick .  She also mentioned her family member died recently and she was very sad.  She continued to endorse lack of energy and sleeping too much.  She is not taking Seroquel every night and some nights she take only 25 mg.  She is tolerating Wellbutrin.  She has no rash, itching, tremors, shakes or any paranoia.  She denies any crying spells or any feeling of hopelessness.  Recently she's seen neurologist and her Aricept was increased to 10 mg.  Patient denies any mania or any psychosis.  She continues to have memory impairment and difficulty in attention and concentration.  She started seeing Brittany Chung but due to her sickness not able to keep appointment on the basis.  Patient denies drinking or using any illegal substances.  Her appetite is fair.  Her vital signs are stable. . Suicidal Ideation: No Plan Formed: No Patient has means to carry out plan: No  Homicidal Ideation: No Plan Formed: No Patient has means to carry out plan: No  Medical History; Patient has history of irregular heartbeat, stroke, cardiac stenosis, hypertension and diabetes mellitus .  She goes to [facility].      Past Psychiatric History/Hospitalization(s) Patient denies any history  of suicidal attempt, inpatient psychiatric treatment of any psychosis.  As per daughter she has history of bipolar disorder but she feels embarrassed to accept it.  She was given Seroquel by her primary care physician but she stopped taking it because of sedation.   She was taking Remeron which was stopped when Seroquel was started a few months ago.  She also Vyvanse in the past to help her energy but due to her cardiac history it was stopped. Anxiety: Yes Bipolar Disorder: Yes Depression: Yes Mania: No Psychosis: No Schizophrenia: No Personality Disorder: No Hospitalization for psychiatric illness: No History of Electroconvulsive Shock Therapy: No Prior Suicide Attempts: No   Review of Systems  Eyes: Negative for blurred vision.  Cardiovascular: Negative for palpitations.  Musculoskeletal: Positive for joint pain.  Skin: Negative for itching and rash.  Neurological: Negative for dizziness and tremors.  Psychiatric/Behavioral: The patient is nervous/anxious.     Psychiatric: Agitation: No Hallucination: No Depressed Mood: No Insomnia: No Hypersomnia: No Altered Concentration: No Feels Worthless: No Grandiose Ideas: No Belief In Special Powers: No New/Increased Substance Abuse: No Compulsions: No  Neurologic: Headache: No Seizure: No Paresthesias: No    Outpatient Encounter Prescriptions as of 11/15/2015  Medication Sig  . atorvastatin (LIPITOR) 80 MG tablet TAKE ONE TABLET BY MOUTH DAILY AT 6 PM  . b complex vitamins tablet Take 1 tablet by mouth daily.  Marland Kitchen  buPROPion (WELLBUTRIN XL) 300 MG 24 hr tablet Take 1 tablet (300 mg total) by mouth every morning.  . Coenzyme Q10 (CO Q-10) 100 MG CAPS Take 1 capsule by mouth daily.   . [employer] CHILDRENS ASPIRIN 81 MG chewable tablet TAKE ONE TABLET BY MOUTH DAILY  . donepezil (ARICEPT) 10 MG tablet Take 1 tablet (10 mg total) by mouth at bedtime.  . enalapril (VASOTEC) 2.5 MG tablet TAKE 1 TABLET BY MOUTH EVERY DAY  .  Glucos-MSM-C-Mn-Ginger-Willow (MSM GLUCOSAMINE COMPLEX PO) Take 1 tablet by mouth daily. Reported on 08/03/2015  . Incontinence Supplies MISC Use as directed  . Incontinence Supply Disposable (REALITY INCONTINENT BRIEFS SM) MISC Use as needed.  Marland Kitchen JANUVIA 100 MG tablet TAKE 1 TABLET BY MOUTH EVERY DAY  . metFORMIN (GLUCOPHAGE) 850 MG tablet TAKE 1 TABLET BY MOUTH TWICE A DAY WITH A MEAL  . metoprolol tartrate (LOPRESSOR) 25 MG tablet TAKE 0.5 TABLETS BY MOUTH 2 TIMES DAILY.  . Multiple Vitamins-Minerals (MULTIVITAMIN WITH MINERALS) tablet Take 1 tablet by mouth every evening.   . Nutritional Supplements (GLUCERNA ADVANCE SHAKE) LIQD Take 1 Bottle by mouth daily.  . Omega-3 Fatty Acids (FISH OIL) 1000 MG CAPS Take 1 capsule by mouth daily.  Marland Kitchen rOPINIRole (REQUIP) 1 MG tablet Take 3 tablets (3 mg total) by mouth at bedtime.  . ticagrelor (BRILINTA) 90 MG TABS tablet Take 1 tablet (90 mg total) by mouth 2 (two) times daily.  . [DISCONTINUED] buPROPion (WELLBUTRIN XL) 150 MG 24 hr tablet Take 1 tablet (150 mg total) by mouth every morning.  . [DISCONTINUED] QUEtiapine (SEROQUEL) 50 MG tablet Take 1 tab at bed time (Patient taking differently: Take 50 mg by mouth at bedtime. Take 1 tab at bed time)  . [DISCONTINUED] sertraline (ZOLOFT) 100 MG tablet Take 1 tablet (100 mg total) by mouth daily.   No facility-administered encounter medications on file as of 11/15/2015.    Recent Results (from the past 2160 hour(s))  POCT urinalysis dipstick     Status: Abnormal   Collection Time: 11/08/15  3:24 PM  Result Value Ref Range   Color, UA DARK YELLOW    Clarity, UA CLEAR    Glucose, UA NEG    Bilirubin, UA NEG    Ketones, UA NEG    Spec Grav, UA 1.025    Blood, UA LARGE    pH, UA 5.5    Protein, UA 30    Urobilinogen, UA 0.2    Nitrite, UA NEG    Leukocytes, UA Negative Negative  POCT UA - Microscopic Only     Status: Abnormal   Collection Time: 11/08/15  3:24 PM  Result Value Ref Range   WBC, Ur,  HPF, POC OCCASIONAL    RBC, urine, microscopic TOO MANY TO COUNT    Bacteria, U Microscopic RARE    Epithelial cells, urine per micros OCCASIONAL   Urine culture     Status: None   Collection Time: 11/08/15  3:39 PM  Result Value Ref Range   Colony Count 15,000 COLONIES/ML    Organism ID, Bacteria Multiple bacterial morphotypes present, none    Organism ID, Bacteria predominant. Suggest appropriate recollection if     Organism ID, Bacteria clinically indicated.       Physical Exam: Constitutional:  BP 110/62 mmHg  Pulse 67  Ht 5' 3.5" (1.613 m)  Wt 138 lb (62.596 kg)  BMI 24.06 kg/m2  Musculoskeletal: Strength & Muscle Tone: within normal limits Gait & Station: normal Patient leans:  Patient has a normal posture  Mental Status Examination;  Patient is groomed and well dressed female.  She maintained fair eye contact.  She described her mood Tired and her affect is mood appropriate.  She admitted fatigue and lack of energy.  Her affect is constricted. Her speech is fast but clear and coherent. Her attention concentration is fair.  She denies any auditory or visual hallucination.  She denies any active or passive suicidal thoughts or homicidal thoughts.  There were no paranoia or any delusions present at this time.  Her fund of knowledge is average.  Her thought process is circumstantial.  There were no flight of ideas or loose association.  Her psychomotor activity is increased. She is alert and oriented x3 however difficulty remembering things..  Her insight judgment and impulse control is okay.   Review of Psycho-Social Stressors (1), Review or order clinical lab tests (1), Established Problem, Worsening (2), Review of Last Therapy Session (1), Review of Medication Regimen & Side Effects (2) and Review of New Medication or Change in Dosage (2)  Assessment: Axis I: ADD inattentive type, bipolar disorder type I, major depressive disorder recurrent mild, cognitive disorder NOS  Axis  II: Deferred  Axis III:  Past Medical History  Diagnosis Date  . Irregular heart beat   . Depression   . Stroke [facility]) 2006  . Cancer ([place])     skin  . Carotid stenosis     s/p R CEA 2006  . Cataract     s/p surgery 2007  . Memory loss   . Diabetes mellitus without complication ([place])   . Hypertension    Plan:  I review notes from neurology and other providers.  Recommended to discontinue Zoloft and try Wellbutrin 300 mg daily.  Also recommended to discontinue Seroquel as she is sleeping much better without the Seroquel.  She is taking Requip from other provider.  Encouraged to keep appointment with Brittany Chung for counseling.  Discussed medication side effects and benefits.  Recommended to call us back if she has any question or any concern.  Follow-up in 2 months.  Recommended to bring her daughter on her next appointment to provide more collateral information.    Elizabethanne Lusher T., MD 11/15/2015

## 2015-11-23 ENCOUNTER — Telehealth: Payer: Self-pay | Admitting: Family Medicine

## 2015-11-23 DIAGNOSIS — E118 Type 2 diabetes mellitus with unspecified complications: Secondary | ICD-10-CM

## 2015-11-23 NOTE — Telephone Encounter (Signed)
She is also wanting to know about her cardiac rehab. Please advise

## 2015-11-23 NOTE — Telephone Encounter (Signed)
Needs referral to Dr Hecker-opthalomolist   Phone number   [phone] [employer] was suppose to contact [employer] for incontinent briefs.   She thinks her A1C is due.

## 2015-11-24 MED ORDER — INCONTINENCE SUPPLIES MISC: Status: AC

## 2015-11-24 MED ORDER — REALITY INCONTINENT BRIEFS SM MISC: Status: AC

## 2015-11-24 NOTE — Telephone Encounter (Signed)
Her cardiologist referred her to cardiac rehab. She will need to contact them for info.  She needs to schedule an office visit to discuss her DM and check her A1C.  I have reordered her incontinence supplies and sent in a referral for ophthalmology.   Brittany Chung. Jerline Pain, [place] Resident PGY-2 11/24/2015 10:32 AM

## 2015-11-24 NOTE — Telephone Encounter (Signed)
LM for patient to call back. Cayla Wiegand,CMA  

## 2015-11-28 NOTE — Telephone Encounter (Signed)
Will wait to hear back from patient. Samba Cumba,CMA

## 2015-12-01 ENCOUNTER — Other Ambulatory Visit: Payer: Self-pay | Admitting: *Deleted

## 2015-12-01 MED ORDER — METOPROLOL TARTRATE 25 MG PO TABS: ORAL_TABLET | ORAL | Status: DC

## 2015-12-01 MED ORDER — ATORVASTATIN CALCIUM 80 MG PO TABS: ORAL_TABLET | ORAL | Status: DC

## 2015-12-01 NOTE — Telephone Encounter (Signed)
Rx filled.  Algis Greenhouse. Jerline Pain, [place] Resident PGY-2 12/01/2015 10:33 AM

## 2015-12-09 ENCOUNTER — Other Ambulatory Visit (HOSPITAL_COMMUNITY): Payer: Self-pay | Admitting: Psychiatry

## 2015-12-10 ENCOUNTER — Other Ambulatory Visit: Payer: Self-pay | Admitting: Psychiatry

## 2015-12-15 ENCOUNTER — Encounter: Payer: Self-pay | Admitting: Student

## 2015-12-15 ENCOUNTER — Ambulatory Visit (INDEPENDENT_AMBULATORY_CARE_PROVIDER_SITE_OTHER): Payer: Commercial Managed Care - HMO | Admitting: Student

## 2015-12-15 VITALS — BP 114/50 | HR 58 | Temp 98.3°F | Wt 132.0 lb

## 2015-12-15 DIAGNOSIS — R112 Nausea with vomiting, unspecified: Secondary | ICD-10-CM | POA: Insufficient documentation

## 2015-12-15 DIAGNOSIS — E1311 Other specified diabetes mellitus with ketoacidosis with coma: Secondary | ICD-10-CM

## 2015-12-15 DIAGNOSIS — Z794 Long term (current) use of insulin: Secondary | ICD-10-CM | POA: Diagnosis not present

## 2015-12-15 DIAGNOSIS — E1111 Type 2 diabetes mellitus with ketoacidosis with coma: Secondary | ICD-10-CM

## 2015-12-15 LAB — POCT GLYCOSYLATED HEMOGLOBIN (HGB A1C): HEMOGLOBIN A1C: 6.8

## 2015-12-15 MED ORDER — METFORMIN HCL 850 MG PO TABS: 850.0000 mg | ORAL_TABLET | Freq: Every day | ORAL | Status: DC

## 2015-12-15 MED ORDER — ONDANSETRON HCL 4 MG PO TABS: 4.0000 mg | ORAL_TABLET | Freq: Three times a day (TID) | ORAL | Status: DC | PRN

## 2015-12-15 NOTE — Patient Instructions (Signed)
Follow up with PCP Please stay hydrated as much as possible You were prescribed Zofran for nausea You may take metformin once daily until you follow with your PCP If you have any questions or concerns, call the office at [phone]

## 2015-12-15 NOTE — Assessment & Plan Note (Signed)
Zofran for nausea today. EKG in 3/17 showed normal QTc of 448. Water given in clinic which she was able to tolerate without issue - Hydration precautions provided

## 2015-12-15 NOTE — Assessment & Plan Note (Signed)
A1c today as she is due. Will reduce metformin as requested from twice a day to once a day due to concerns for tolerance - Follow with PCP for diabetes regimen

## 2015-12-15 NOTE — Progress Notes (Signed)
   Subjective:    Patient ID: Brittany Chung, female    DOB: 01-23-1941, 75 y.o.   MRN: [id number]   CC: Nausea/vomitting/diarrhea  HPI:  75 year old female presents for nausea, vomiting, diarrhea  Nausea, vomiting, diarrhea - Patient reports she's had nausea vomiting diarrhea for the last 6 months. She feels that her metformin was increased from once a day to twice a day 6 months ago. - She started taking metformin once a day 3 weeks ago due to GI upset  -she has been tolerating liquids and PO, with occasional nausea and vomiting afterwards - significantly her grandchildren have been sick with GI illnesses - She denies fevers - She does have IBS and she feels that her diarrhea is consistent with her chronic diarrhea from IBS - Has not had diarrhea today - She requests to reduce her metformin from twice a day dosing to once daily dosing -   Smoking status reviewed  Review of Systems Per HPI, else denies  headache, changes in vision, chest pain, shortness of breath, abdominal pain, weakness  Objective:  BP 114/50 mmHg  Pulse 58  Temp(Src) 98.3 F (36.8 C) (Oral)  Wt 132 lb (59.875 kg)  SpO2 98% Vitals and nursing note reviewed  General: NAD Cardiac: RRR,  Respiratory: CTAB, normal effort Abdomen: soft, nontender, nondistended, no hepatic or splenomegaly. Bowel sounds present Extremities: no edema or cyanosis. WWP. Skin: warm and dry, no rashes noted Neuro: alert and oriented,  Neuro: Frequently changes her history, with unclear time course of symptoms    Assessment & Plan:    Nausea with vomiting Zofran for nausea today. EKG in 3/17 showed normal QTc of 448. Water given in clinic which she was able to tolerate without issue - Hydration precautions provided   Diabetes mellitus, type II A1c today as she is due. Will reduce metformin as requested from twice a day to once a day due to concerns for tolerance - Follow with PCP for diabetes regimen     Harbert Fitterer A.  Lincoln Brigham MD, [facility] [facility] Resident PGY-2 Pager [phone]

## 2015-12-21 ENCOUNTER — Telehealth: Payer: Self-pay | Admitting: Family Medicine

## 2015-12-21 NOTE — Telephone Encounter (Signed)
Daughter called because her mother has a yeast infection and since she is older she has the signs of a yeast infection. Please call daughter to discuss. jw

## 2015-12-22 ENCOUNTER — Ambulatory Visit (INDEPENDENT_AMBULATORY_CARE_PROVIDER_SITE_OTHER): Payer: Commercial Managed Care - HMO | Admitting: Family Medicine

## 2015-12-22 ENCOUNTER — Telehealth (HOSPITAL_COMMUNITY): Payer: Self-pay

## 2015-12-22 ENCOUNTER — Encounter: Payer: Self-pay | Admitting: Family Medicine

## 2015-12-22 VITALS — BP 92/43 | HR 58 | Temp 98.1°F | Wt 132.0 lb

## 2015-12-22 DIAGNOSIS — R319 Hematuria, unspecified: Secondary | ICD-10-CM | POA: Diagnosis not present

## 2015-12-22 DIAGNOSIS — F310 Bipolar disorder, current episode hypomanic: Secondary | ICD-10-CM

## 2015-12-22 DIAGNOSIS — R5383 Other fatigue: Secondary | ICD-10-CM

## 2015-12-22 DIAGNOSIS — R112 Nausea with vomiting, unspecified: Secondary | ICD-10-CM | POA: Diagnosis not present

## 2015-12-22 DIAGNOSIS — I10 Essential (primary) hypertension: Secondary | ICD-10-CM | POA: Diagnosis not present

## 2015-12-22 LAB — POCT URINALYSIS DIPSTICK
Bilirubin, UA: NEGATIVE
Glucose, UA: NEGATIVE
Nitrite, UA: NEGATIVE
PROTEIN UA: NEGATIVE
SPEC GRAV UA: 1.02
UROBILINOGEN UA: 1
pH, UA: 6

## 2015-12-22 LAB — POCT UA - MICROSCOPIC ONLY: RBC, urine, microscopic: >20

## 2015-12-22 MED ORDER — DIVALPROEX SODIUM 125 MG PO DR TAB: 125.0000 mg | DELAYED_RELEASE_TABLET | Freq: Two times a day (BID) | ORAL | Status: DC

## 2015-12-22 NOTE — Telephone Encounter (Signed)
Patients daughter calling, patient is having increased agitation and depression - she has been having outbursts. She is currently on Wellbutrin 300 mg a day. I spoke to Dr. Casimiro Needle and he recommended starting patient on Depakote 250 mg BID. I called patients daughter to let her know and she voiced her understanding, she is also taking her mom to the PCP today.

## 2015-12-22 NOTE — Assessment & Plan Note (Signed)
Again noted on UA today. Patient currently being managed by urology for this.

## 2015-12-22 NOTE — Assessment & Plan Note (Signed)
Bp low today. Overtreatment may be contributing to patient fatigue and lethargy. Instructed patient and daughter to follow up with cardiology about reducing treatment if work up today unremarkable.

## 2015-12-22 NOTE — Telephone Encounter (Signed)
Patient needs to schedule an appointment for evaluation.  Brittany Chung. Jerline Pain, [place] [facility] Resident PGY-3 12/22/2015 8:39 AM

## 2015-12-22 NOTE — Assessment & Plan Note (Signed)
Unclear etiology. May be related to metformin vs viral infection given recently increased diarrhea. Will continue to monitor for now. If not improving, consider discontinuing metformin.

## 2015-12-22 NOTE — Progress Notes (Signed)
Subjective:  Brittany Chung is a 75 y.o. female who presents to the [facility] today with a chief complaint of abdominal pain and irritability.   HPI:  Abdominal Pain / Nausea / Dizziness Patient with abdominal pain and nausea for the past few weeks. She was seen in clinic last and given a prescription for zofran. Abdominal pain is in lower part of abdomen and feels like "menstrual cramps." She has not noticed anything that makes the pain better or worse. No fevers or chills. She has had some diarrhea. Was also recently around grandchildren with a GI illness.   She has also noticed increased fatigue and occasional dizziness over the past few weeks.  She has been more irritably lately. She went to her behavioral health specialist who recommended that she be tested for a UTI. No dysuria, increased frequency, or urgency. No fevers or chills.   Hematuria Patient has a chronic history of hematuria. She has been referred to urology for this. She again noticed some blood in her urine last week. Her urologist ordered a CT scan but told her that it was normal. She has not seen them since.   ROS: Per HPI  PMH: Smoking history reviewed.    Objective:  Physical Exam: BP 92/43 mmHg  Pulse 58  Temp(Src) 98.1 F (36.7 C) (Oral)  Wt 132 lb (59.875 kg)  Gen: NAD, resting comfortably CV: RRR with no murmurs appreciated Pulm: NWOB, CTAB with no crackles, wheezes, or rhonchi GI: Normal bowel sounds present. Soft, Nontender, Nondistended. MSK: no edema, cyanosis, or clubbing noted Skin: warm, dry Neuro: grossly normal, moves all extremities Psych: Normal affect and thought content  Results for orders placed or performed in visit on 12/22/15 (from the past 72 hour(s))  POCT urinalysis dipstick     Status: Abnormal   Collection Time: 12/22/15  4:18 PM  Result Value Ref Range   Color, UA YELLOW    Clarity, UA CLEAR    Glucose, UA NEG    Bilirubin, UA NEG    Ketones, UA TRACE    Spec Grav, UA  1.020    Blood, UA LARGE    pH, UA 6.0    Protein, UA NEG    Urobilinogen, UA 1.0    Nitrite, UA NEG    Leukocytes, UA small (1+) (A) Negative  POCT UA - Microscopic Only     Status: Abnormal   Collection Time: 12/22/15  4:18 PM  Result Value Ref Range   WBC, Ur, HPF, POC 1-5    RBC, urine, microscopic >20    Bacteria, U Microscopic TRACE    Epithelial cells, urine per micros 1-5    Casts, Ur, LPF, POC OCCASIONAL HYALINE    Assessment/Plan:  Nausea with vomiting Unclear etiology. May be related to metformin vs viral infection given recently increased diarrhea. Will continue to monitor for now. If not improving, consider discontinuing metformin.   Hematuria Again noted on UA today. Patient currently being managed by urology for this.   Essential hypertension Bp low today. Overtreatment may be contributing to patient fatigue and lethargy. Instructed patient and daughter to follow up with cardiology about reducing treatment if work up today unremarkable.   Fatigue Likely multifactorial in setting of soft BPs, and several recently starting depakote. Will check for UTI today, however doubt this is a source of fatigue as patient has no other symptoms. Hgb 11.7 three months ago. Follow up in 2 weeks. Consider further work up with TSH, CBC, B12 etc if  urine culture negative and symptoms not improved with less treatment for BP.   Algis Greenhouse. Jerline Pain, [place] Resident PGY-3 12/22/2015 5:42 PM

## 2015-12-22 NOTE — Patient Instructions (Signed)
We will check to see if you have a urinary tract infection. We will not know until early next week.  If you do, we will treat it and see if your symptoms resolve.  If you do not, we will stop the metformin. You should also see your heart doctor about decreasing your blood pressure medication.  Please come back soon for a regular appointment.  If you have chest pain or shortness of breath, please seek medical care immediately.  Take care,  Dr Jerline Pain

## 2015-12-22 NOTE — Assessment & Plan Note (Signed)
Likely multifactorial in setting of soft BPs, and several recently starting depakote. Will check for UTI today, however doubt this is a source of fatigue as patient has no other symptoms. Hgb 11.7 three months ago. Follow up in 2 weeks. Consider further work up with TSH, CBC, B12 etc if urine culture negative and symptoms not improved with less treatment for BP.

## 2015-12-22 NOTE — Telephone Encounter (Signed)
Scheduled today at 4pm with Dr. Jerline Pain.

## 2015-12-26 ENCOUNTER — Telehealth: Payer: Self-pay | Admitting: Family Medicine

## 2015-12-26 DIAGNOSIS — R319 Hematuria, unspecified: Secondary | ICD-10-CM

## 2015-12-26 MED ORDER — CEPHALEXIN 500 MG PO CAPS: 500.0000 mg | ORAL_CAPSULE | Freq: Two times a day (BID) | ORAL | 0 refills | Status: DC

## 2015-12-26 MED ORDER — ONDANSETRON HCL 4 MG PO TABS: 4.0000 mg | ORAL_TABLET | Freq: Three times a day (TID) | ORAL | 0 refills | Status: DC | PRN

## 2015-12-26 NOTE — Telephone Encounter (Signed)
Tried to call patient to inform her of urine studies. No answer. Left voicemail with call back number. I would like for her to come in to give another specimen.  Algis Greenhouse. Jerline Pain, [place] Resident PGY-3 12/26/2015 9:45 AM

## 2015-12-26 NOTE — Telephone Encounter (Signed)
Patient daughter returning MD call, states it takes a lot out of her mother coming to appointments. Would like MD to give her a call back about other options.

## 2015-12-26 NOTE — Telephone Encounter (Signed)
Discussed case with daughter. Michela Pitcher that it was very difficult to take her out of the apartment. Given her symptoms and difficulty to get back into clinic for urine culture, will empirically treat for UTI with 7 day course of keflex. Patient's daughter voiced understanding.  Algis Greenhouse. Jerline Pain, [place] [facility] Resident PGY-3 12/26/2015 12:24 PM

## 2016-01-02 ENCOUNTER — Encounter: Payer: Self-pay | Admitting: Family Medicine

## 2016-01-02 ENCOUNTER — Ambulatory Visit (INDEPENDENT_AMBULATORY_CARE_PROVIDER_SITE_OTHER): Payer: Commercial Managed Care - HMO | Admitting: Family Medicine

## 2016-01-02 DIAGNOSIS — E785 Hyperlipidemia, unspecified: Secondary | ICD-10-CM | POA: Diagnosis not present

## 2016-01-02 DIAGNOSIS — E118 Type 2 diabetes mellitus with unspecified complications: Secondary | ICD-10-CM | POA: Diagnosis not present

## 2016-01-02 DIAGNOSIS — I10 Essential (primary) hypertension: Secondary | ICD-10-CM | POA: Diagnosis not present

## 2016-01-02 DIAGNOSIS — K582 Mixed irritable bowel syndrome: Secondary | ICD-10-CM | POA: Diagnosis not present

## 2016-01-02 NOTE — Assessment & Plan Note (Signed)
Seems to have constipation-predominant type at the moment. Not currently on any medications. Instructed patient to take daily fiber supplement. Consider psylium supplements vs miralax if still has issues with constipation at follow up.

## 2016-01-02 NOTE — Patient Instructions (Signed)
Stop the metformin to see if that helps with your nausea.  Talk to your heart doctor about lowering your blood pressure medications  Talk to your GI doctor about getting your colonoscopy and follow up for your IBS.  Please see me again in 2-3 months.  Take care,  Dr Jerline Pain

## 2016-01-02 NOTE — Assessment & Plan Note (Signed)
Systolic pressure continues to be low - 42 today. Discussed with patient that this could be contributing to her generalized fatigue and occasional dizziness. Asked patient to discuss with her cardiologist about changing or reducing dose of beta blocker medications. Patient stated that she would make an appointment to see her cardiologist. Will not make any changes today.

## 2016-01-02 NOTE — Progress Notes (Signed)
   Subjective:  Brittany Chung is a 75 y.o. female who presents to the [facility] today with a chief complaint of hypertension follow up.   HPI:  Hypertension BP Readings from Last 3 Encounters:  01/02/16 (!) 143/42  12/22/15 (!) 92/43  12/15/15 (!) 114/50   Home BP monitoring-None. Compliant with medications-Yes, without side effects ROS-Denies any CP, HA, SOB, blurry vision, LE edema, transient weakness, orthopnea, PND.   T2DM Currently tolerating Januvia without side effects. Has been taking metformin, but thinks that it is causing her to feel nauseated. Recently decreased her dose of metformin and has had improvement in her nausea. No polyuria or polydipsia.   HLD Tolerating atorvastatin without side effects.   IBS Patient has been more constipated over the past several days. Used a laxative yesterday which helped her to have a bowel movement. No recent diarrhea, though says that she usually goes from diarrhea to constipation - rarely has a normal BM. IS not currently on any medications for IBS.   ROS: Per HPI  PMH: Smoking history reviewed.    Objective:  Physical Exam: BP (!) 143/42   Pulse 66   Temp 98.5 F (36.9 C) (Oral)   Wt 136 lb (61.7 kg)   SpO2 100%   BMI 23.71 kg/m   Gen: NAD, resting comfortably CV: RRR with no murmurs appreciated Pulm: NWOB, CTAB with no crackles, wheezes, or rhonchi GI: Normal bowel sounds present. Soft, Nontender, Nondistended. MSK: no edema, cyanosis, or clubbing noted Skin: warm, dry Neuro: grossly normal, moves all extremities Psych: Normal affect and thought content  Assessment/Plan:  Essential hypertension Systolic pressure continues to be low - 42 today. Discussed with patient that this could be contributing to her generalized fatigue and occasional dizziness. Asked patient to discuss with her cardiologist about changing or reducing dose of beta blocker medications. Patient stated that she would make an appointment to see her  cardiologist. Will not make any changes today.   Type 2 diabetes mellitus with complication (HCC) Will try off metformin to see if it helps with her ongoing nausea. Instructed patient to give a 2 week trial to see if she noticed any difference. Will continue [place]. Due for Recheck A1c in 2 months. Last A1c 6.8.   Hyperlipidemia with target LDL less than 70 Tolerating atorvastatin 80mg . Will continue.   Irritable bowel syndrome Seems to have constipation-predominant type at the moment. Not currently on any medications. Instructed patient to take daily fiber supplement. Consider psylium supplements vs miralax if still has issues with constipation at follow up.   Algis Greenhouse. Jerline Pain, [place] Resident PGY-3 01/02/2016 4:55 PM

## 2016-01-02 NOTE — Assessment & Plan Note (Signed)
Tolerating atorvastatin 80mg . Will continue.

## 2016-01-02 NOTE — Assessment & Plan Note (Signed)
Will try off metformin to see if it helps with her ongoing nausea. Instructed patient to give a 2 week trial to see if she noticed any difference. Will continue [place]. Due for Recheck A1c in 2 months. Last A1c 6.8.

## 2016-01-03 ENCOUNTER — Other Ambulatory Visit: Payer: Self-pay | Admitting: Family Medicine

## 2016-01-07 ENCOUNTER — Other Ambulatory Visit (HOSPITAL_COMMUNITY): Payer: Self-pay | Admitting: Psychiatry

## 2016-01-07 DIAGNOSIS — F310 Bipolar disorder, current episode hypomanic: Secondary | ICD-10-CM

## 2016-01-09 MED ORDER — SITAGLIPTIN PHOSPHATE 100 MG PO TABS: 100.0000 mg | ORAL_TABLET | Freq: Every day | ORAL | 3 refills | Status: AC

## 2016-01-09 MED ORDER — ATORVASTATIN CALCIUM 80 MG PO TABS: ORAL_TABLET | ORAL | 3 refills | Status: AC

## 2016-01-09 NOTE — Addendum Note (Signed)
Addended by: Valerie Roys on: 01/09/2016 10:10 AM   Modules accepted: Orders

## 2016-01-09 NOTE — Telephone Encounter (Signed)
Received pharmacy request for 90 day supply of januvia.  Will forward to MD to advise. Jazmin Hartsell,CMA

## 2016-01-11 ENCOUNTER — Other Ambulatory Visit (HOSPITAL_COMMUNITY): Payer: Self-pay

## 2016-01-11 DIAGNOSIS — F310 Bipolar disorder, current episode hypomanic: Secondary | ICD-10-CM

## 2016-01-11 MED ORDER — BUPROPION HCL ER (XL) 300 MG PO TB24: 300.0000 mg | ORAL_TABLET | ORAL | 1 refills | Status: DC

## 2016-01-16 ENCOUNTER — Ambulatory Visit (HOSPITAL_COMMUNITY): Payer: Self-pay | Admitting: Psychiatry

## 2016-01-18 ENCOUNTER — Other Ambulatory Visit (HOSPITAL_COMMUNITY): Payer: Self-pay | Admitting: Psychiatry

## 2016-01-18 DIAGNOSIS — F310 Bipolar disorder, current episode hypomanic: Secondary | ICD-10-CM

## 2016-01-19 ENCOUNTER — Ambulatory Visit (INDEPENDENT_AMBULATORY_CARE_PROVIDER_SITE_OTHER): Payer: Commercial Managed Care - HMO | Admitting: Family Medicine

## 2016-01-19 ENCOUNTER — Other Ambulatory Visit: Payer: Self-pay | Admitting: Specialist

## 2016-01-19 ENCOUNTER — Encounter: Payer: Self-pay | Admitting: Family Medicine

## 2016-01-19 ENCOUNTER — Telehealth: Payer: Self-pay | Admitting: Family Medicine

## 2016-01-19 VITALS — BP 139/48 | HR 68 | Temp 98.4°F | Wt 134.0 lb

## 2016-01-19 DIAGNOSIS — Z1211 Encounter for screening for malignant neoplasm of colon: Secondary | ICD-10-CM

## 2016-01-19 DIAGNOSIS — N63 Unspecified lump in breast: Secondary | ICD-10-CM | POA: Diagnosis not present

## 2016-01-19 DIAGNOSIS — N6310 Unspecified lump in the right breast, unspecified quadrant: Principal | ICD-10-CM

## 2016-01-19 DIAGNOSIS — N6315 Unspecified lump in the right breast, overlapping quadrants: Secondary | ICD-10-CM

## 2016-01-19 DIAGNOSIS — Z1283 Encounter for screening for malignant neoplasm of skin: Secondary | ICD-10-CM

## 2016-01-19 DIAGNOSIS — E118 Type 2 diabetes mellitus with unspecified complications: Secondary | ICD-10-CM

## 2016-01-19 NOTE — Telephone Encounter (Signed)
Pt is calling because she needs a referral to see the dermatologist and the eye doctor. jw

## 2016-01-19 NOTE — Progress Notes (Signed)
   Subjective:    Patient ID: Brittany Chung is a 75 y.o. female presenting with Breast Problem (right breast lumps, hx of calcified breast)  on 01/19/2016  HPI: 1 month h/o of breast lump. Reports family history of breast cancer. No mammogram x 5 years at least. Does breast self checks and noted a lump 1 month ago. Seems to have disappeared, or comes and goes feels mobile. Lump is not painful. Wants to have derm referral for skin cancer screen and is in need colon cancer screening.  Review of Systems  Constitutional: Negative for chills and fever.  Respiratory: Negative for shortness of breath.   Cardiovascular: Negative for chest pain.  Gastrointestinal: Negative for abdominal pain, nausea and vomiting.  Genitourinary: Negative for dysuria.  Skin: Negative for rash.      Objective:    BP (!) 139/48   Pulse 68   Temp 98.4 F (36.9 C) (Oral)   Wt 134 lb (60.8 kg)   BMI 23.36 kg/m  Physical Exam  Constitutional: She is oriented to person, place, and time. She appears well-developed and well-nourished. No distress.  HENT:  Head: Normocephalic and atraumatic.  Eyes: No scleral icterus.  Neck: Neck supple.  Cardiovascular: Normal rate.   Pulmonary/Chest: Effort normal.    Abdominal: Soft.  Lymphadenopathy:       Right axillary: No lateral adenopathy present.       Left axillary: No lateral adenopathy present. Neurological: She is alert and oriented to person, place, and time.  Skin: Skin is warm and dry.  Psychiatric: She has a normal mood and affect.        Assessment & Plan:   Problem List Items Addressed This Visit    None    Visit Diagnoses    Breast lump on right side at 6 o'clock position    -  Primary   Relevant Orders   MM DIAG BREAST TOMO BILATERAL   US BREAST LTD UNI RIGHT INC AXILLA   Special screening for malignant neoplasms, colon       Relevant Orders   Ambulatory referral to Gastroenterology   Screening for skin cancer       Relevant  Orders   Ambulatory referral to Dermatology       Total face-to-face time with patient: 15 minutes. Over 50% of encounter was spent on counseling and coordination of care.  Return if symptoms worsen or fail to improve.  Lynleigh Kovack S 01/19/2016 4:01 PM

## 2016-01-19 NOTE — Telephone Encounter (Signed)
Patient came in and saw Dr. Kennon Rounds today. Raja Liska,CMA

## 2016-01-19 NOTE — Patient Instructions (Signed)
Fibrocystic Breast Changes °Fibrocystic breast changes occur when breast ducts become blocked, causing painful, fluid-filled lumps (cysts) to form in the breast. This is a common condition that is noncancerous (benign). It occurs when women go through hormonal changes during their menstrual cycle. Fibrocystic breast changes can affect one or both breasts. °CAUSES  °The exact cause of fibrocystic breast changes is not known, but it may be related to the female hormones estrogen and progesterone. Family traits that get passed from parent to child (genetics) may also be a factor in some cases. °SIGNS AND SYMPTOMS  °· Tenderness, mild discomfort, or pain.   °· Swelling.   °· Rope-like feeling when touching the breast.   °· Lumpy breast, one or both sides.   °· Changes in breast size, especially before (larger) and after (smaller) the menstrual period.   °· Green or dark brown nipple discharge (not blood).   °Symptoms are usually worse before menstrual periods start and get better toward the end of the menstrual period.  °DIAGNOSIS  °To make a diagnosis, your health care provider will ask you questions and perform a physical exam of your breasts. The health care provider may recommend other tests that can examine inside your breasts, such as: °· A breast X-ray (mammogram).   °· Ultrasonography.  °· An MRI.   °If something more than fibrocystic breast changes is suspected, your health care provider may take a breast tissue sample (breast biopsy) to examine. °TREATMENT  °Often, treatment is not needed. Your health care provider may recommend over-the-counter pain relievers to help lessen pain or discomfort caused by the fibrocystic breast changes. You may also be asked to change your diet to limit or stop eating foods or drinking beverages that contain caffeine. Foods and beverages that contain caffeine include chocolate, soda, coffee, and tea. Reducing sugar and fat in your diet may also help. Your health care provider  may also recommend: °· Fine needle aspiration to remove fluid from a cyst that is causing pain.   °· Surgery to remove a large, persistent, and tender cyst. °HOME CARE INSTRUCTIONS  °· Examine your breasts after every menstrual period. If you do not have menstrual periods, check your breasts the first day of every month. Feel for changes, such as more tenderness, a new growth, a change in breast size, or a change in a lump that has always been there.   °· Only take over-the-counter or prescription medicine as directed by your health care provider.   °· Wear a well-fitted support or sports bra, especially when exercising.   °· Decrease or avoid caffeine, fat, and sugar in your diet as directed by your health care provider.   °SEEK MEDICAL CARE IF:  °· You have fluid leaking (discharge) from your nipples, especially bloody discharge.   °· You have new lumps or bumps in the breast.   °· Your breast or breasts become enlarged, red, and painful.   °· You have areas of your breast that pucker in.   °· Your nipples appear flat or indented.   °  °This information is not intended to replace advice given to you by your health care provider. Make sure you discuss any questions you have with your health care provider. °  °Document Released: 03/07/2006 Document Revised: 02/09/2015 Document Reviewed: 11/09/2012 °Elsevier Interactive Patient Education ©2016 Elsevier Inc. ° ° °

## 2016-01-19 NOTE — Telephone Encounter (Signed)
Ophthalmology referral placed. I do not see a specific reason on her problem list for why she needs a dermatology referral. If she is having a skin issue, she should be seen in our clinic first prior to a referral being placed.   Algis Greenhouse. Jerline Pain, [place] Resident PGY-3 01/19/2016 1:56 PM

## 2016-01-20 ENCOUNTER — Encounter: Payer: Self-pay | Admitting: Internal Medicine

## 2016-01-25 ENCOUNTER — Ambulatory Visit
Admission: RE | Admit: 2016-01-25 | Discharge: 2016-01-25 | Disposition: A | Discharge status: Home / Self Care | Payer: Commercial Managed Care - HMO | Source: Ambulatory Visit | Attending: Family Medicine | Admitting: Family Medicine

## 2016-01-25 DIAGNOSIS — N6310 Unspecified lump in the right breast, unspecified quadrant: Principal | ICD-10-CM

## 2016-01-25 DIAGNOSIS — N6315 Unspecified lump in the right breast, overlapping quadrants: Secondary | ICD-10-CM

## 2016-01-30 ENCOUNTER — Telehealth: Payer: Self-pay | Admitting: *Deleted

## 2016-01-30 MED ORDER — VALACYCLOVIR HCL 500 MG PO TABS: 500.0000 mg | ORAL_TABLET | Freq: Two times a day (BID) | ORAL | 0 refills | Status: AC

## 2016-01-30 NOTE — Telephone Encounter (Signed)
Will forward to MD to advise. Sharica Roedel,CMA  

## 2016-01-30 NOTE — Telephone Encounter (Signed)
Patient state she has sore around her vagina and she has a history of herpes so would like to know if medication can be called in without being seen.

## 2016-01-30 NOTE — Telephone Encounter (Signed)
Informed pt her rx was at her pharmacy.

## 2016-01-30 NOTE — Telephone Encounter (Signed)
Rx sent in.  Brittany Chung. Jerline Pain, [place] Resident PGY-3 01/30/2016 3:51 PM

## 2016-02-07 ENCOUNTER — Telehealth (HOSPITAL_COMMUNITY): Payer: Self-pay

## 2016-02-07 ENCOUNTER — Telehealth: Payer: Self-pay | Admitting: Family Medicine

## 2016-02-07 DIAGNOSIS — F310 Bipolar disorder, current episode hypomanic: Secondary | ICD-10-CM

## 2016-02-07 MED ORDER — QUETIAPINE FUMARATE 25 MG PO TABS: 25.0000 mg | ORAL_TABLET | Freq: Every day | ORAL | 0 refills | Status: DC

## 2016-02-07 NOTE — Telephone Encounter (Signed)
Will forward to MD. Sabriel Borromeo,CMA  

## 2016-02-07 NOTE — Telephone Encounter (Signed)
Medication management - Telephone call back with pt's daughter, Ms. Ids to inform Dr. Lovena Le reviewed pt's history and would try Seroquel 25mg , one at bedtime but would like urologist labs before trying anything else as concerned pt. with a possible UTI.  Ms. Jodene Nam agreed with plan but questioned if he would want to try Lorazepam that Dr. Adele Schilder had patient on in the past for anxiety and agitation.  Informed he would want the lab workup from urologist back before trying any additional medications and collateral agreed with plan.  Agreed to e-scribe in the Seroquel 25mg , one at bedtime order per Dr. Lovena Le to patient's [facility] on [facility].  One time order e-scribed as Dr. Lovena Le approved.

## 2016-02-07 NOTE — Telephone Encounter (Signed)
Patient can schedule an appointment with Dr McDiarmid in the [facility].  Algis Greenhouse. Jerline Pain, [place] Resident PGY-3 02/07/2016 11:40 AM

## 2016-02-07 NOTE — Telephone Encounter (Signed)
Spoke with daughter and she states that she would like to talk with provider without patient around.  She would prefer a phone call but can make an appointment, even though she would prefer to not discuss her concerns with her mother around. Monchel Pollitt,CMA

## 2016-02-07 NOTE — Telephone Encounter (Signed)
Spoke with patient's daughter. Michela Pitcher that her mother has had a "tough" week. Has been more irritable and explosive. Her mother has not been sleeping much. Daughter currently feels safe at home and does not think that the patient is a danger to herself or others. She started to calm down some yesterday.  Has appointment with Psychiatry later this month.  I recommended that the daughter try to get the patient in to see the psychiatrist some time within the next couple of days. If not, I recommended that she schedule an appointment here at the [facility]. She may need to be seen in [facility] [facility]. Discussed reasons to seek emergent care.  Patient will likely need integrated health consult if she schedule an appointment here to provide resources for the daughter.  Algis Greenhouse. Jerline Pain, [place] [facility] Resident PGY-3 02/07/2016 3:20 PM

## 2016-02-07 NOTE — Telephone Encounter (Signed)
Daughter is calling because her mother's dementia is getting worse and she would like to know what the next steps are going to be and see what they need to do. jw

## 2016-02-07 NOTE — Telephone Encounter (Signed)
Telephone call with Ms. Ids, patient's daughter with concern patient recently started experiencing increased problems with outbursts and even combative and hitting at times.  States this has occurred in the past but was not sure of what Dr. Adele Schilder used to assist.  States patient is currently having some bladder problems but could not define if potentially a UTI.  States they did a clean catch urine sample earlier this date and took into urologist office.  States she would like to discuss with provider to see if there is anything that can be added to assist with present mood changes ane would like a call back.  Patient next scheduled for Dr. Adele Schilder on 03/01/16.  Reports patient took a 3 day round of antibiotics the previous week but not sure of reason.

## 2016-02-09 ENCOUNTER — Telehealth (HOSPITAL_COMMUNITY): Payer: Self-pay

## 2016-02-09 NOTE — Telephone Encounter (Signed)
Medication management - Left Ms. Ids a message this nurse received her message but that it would be difficult to get pt. in this week due to Dr. Adele Schilder still out. Questioned if pt. had gotten lab results from urologist that was discussed on 02/07/16 as she is now stating patient is in need to be evaluated.  Also questioned if patient had started Seroquel 25mg  one at bedtime and requested she call back to schedule first available appointment if needed.

## 2016-02-16 ENCOUNTER — Other Ambulatory Visit: Payer: Self-pay | Admitting: Family Medicine

## 2016-02-16 NOTE — Telephone Encounter (Signed)
Rx filled.  Algis Greenhouse. Jerline Pain, [place] Resident PGY-3 02/16/2016 9:14 AM

## 2016-02-27 ENCOUNTER — Other Ambulatory Visit (HOSPITAL_COMMUNITY): Payer: Self-pay | Admitting: Psychiatry

## 2016-02-27 DIAGNOSIS — F310 Bipolar disorder, current episode hypomanic: Secondary | ICD-10-CM

## 2016-02-28 ENCOUNTER — Telehealth: Payer: Self-pay | Admitting: *Deleted

## 2016-02-28 NOTE — Telephone Encounter (Signed)
Patient has an appt to see you this coming Friday. Brittany Chung,CMA

## 2016-02-28 NOTE — Telephone Encounter (Signed)
Called PT back. Left VM on confidential voicemail. Gave verbal order and also asked that the patient schedule an appointment to be seen as soon as possible to address her other issues.  Algis Greenhouse. Jerline Pain, [place] [facility] Resident PGY-3 02/28/2016 10:23 AM

## 2016-02-28 NOTE — Telephone Encounter (Signed)
Herbert Deaner, Physical Therapist with [facility] called to request verbal orders.  Physical therapy twice a week for 4 weeks; pt is high fall risk. Also need speech therapy referral due to patient's daughter reporting difficulty swallowing and dementia is worsening. Patient has intermittent dizziness, blood pressure 100/50, standing BP 80/50.  Patient's daughter also reported decrease water intake, he educated the family.  Please give him a call at [phone].  Derl Barrow, RN

## 2016-03-01 ENCOUNTER — Ambulatory Visit (INDEPENDENT_AMBULATORY_CARE_PROVIDER_SITE_OTHER): Payer: Commercial Managed Care - HMO | Admitting: Psychiatry

## 2016-03-01 VITALS — BP 117/57 | HR 69 | Resp 12 | Ht 63.5 in | Wt 134.8 lb

## 2016-03-01 DIAGNOSIS — F900 Attention-deficit hyperactivity disorder, predominantly inattentive type: Secondary | ICD-10-CM | POA: Diagnosis not present

## 2016-03-01 DIAGNOSIS — F310 Bipolar disorder, current episode hypomanic: Secondary | ICD-10-CM

## 2016-03-01 MED ORDER — SERTRALINE HCL 50 MG PO TABS: 50.0000 mg | ORAL_TABLET | Freq: Every day | ORAL | 2 refills | Status: DC

## 2016-03-01 MED ORDER — VALPROIC ACID LIQD: 250.0000 mg | Freq: Two times a day (BID) | 0 refills | Status: DC
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
## 2016-03-02 ENCOUNTER — Ambulatory Visit (INDEPENDENT_AMBULATORY_CARE_PROVIDER_SITE_OTHER): Payer: Commercial Managed Care - HMO | Admitting: Family Medicine

## 2016-03-02 DIAGNOSIS — Z23 Encounter for immunization: Secondary | ICD-10-CM

## 2016-03-02 DIAGNOSIS — W19XXXA Unspecified fall, initial encounter: Secondary | ICD-10-CM | POA: Insufficient documentation

## 2016-03-02 DIAGNOSIS — G3184 Mild cognitive impairment, so stated: Secondary | ICD-10-CM

## 2016-03-02 NOTE — Assessment & Plan Note (Signed)
Patient's BP continues to be low. DBP 47 today. Given her recent fall, I will stop her metoprolol today (patient also bradycardic today). Asked her to follow up with her cardiologist soon for ongoing management. Will follow up in 1-2 weeks. May need to stop or decreased enalapril dose as well.

## 2016-03-02 NOTE — Progress Notes (Signed)
    Subjective:  Brittany Chung is a 75 y.o. female who presents to the [facility] today with a chief complaint of blood pressure and mood follow up. History is provided by the patient and her daughter.   HPI:  Dizziness/Falls Patient with recent fall. She has been working with home OT and PT and has done fairly well with them. She is still living at home, though her daughter is able to assist her throughout the day.   Hypertension Patient also found to have low BP by her Wilder.   BP Readings from Last 3 Encounters:  03/02/16 (!) 106/47  01/19/16 (!) 139/48  01/02/16 (!) 143/42   Home BP monitoring-YEs Compliant with medications-yes, without side effects ROS-Denies any CP, HA, SOB, blurry vision, LE edema, transient weakness, orthopnea, PND.   Mood Currently stable. Was recently seen by her psychiatrist who made some adjustments and recommended that she have a neurology evaluation for progressive dementia.   ROS: Per HPI  PMH: Smoking history reviewed.    Objective:  Physical Exam: BP (!) 106/47 (BP Location: Right Arm, Patient Position: Sitting, Cuff Size: Normal)   Pulse (!) 58   Temp 98 F (36.7 C) (Oral)   Ht 5\' 4"  (1.626 m)   Wt 132 lb 9.6 oz (60.1 kg)   SpO2 100%   BMI 22.76 kg/m   Gen: NAD, resting comfortably, in wheelchair CV: RRR with no murmurs appreciated Pulm: NWOB, CTAB with no crackles, wheezes, or rhonchi GI: Normal bowel sounds present. Soft, Nontender, Nondistended. MSK: no edema, cyanosis, or clubbing noted Skin: warm, dry Neuro: grossly normal, moves all extremities Psych: Normal affect and thought content  Assessment/Plan:  Essential hypertension Patient's BP continues to be low. DBP 47 today. Given her recent fall, I will stop her metoprolol today (patient also bradycardic today). Asked her to follow up with her cardiologist soon for ongoing management. Will follow up in 1-2 weeks. May need to stop or decreased enalapril dose as well.    Falls Currently working with OT and PT. Likely as a component of orthostasis - will stop metoprolol today. Follow up in 1-2 weeks. Continue OT/PT.   Mood swings (HCC) Currently stable. Has close follow up with psychiatry.  Mild cognitive impairment Has follow up with neurology scheduled.    Algis Greenhouse. Jerline Pain, [place] Resident PGY-3 03/02/2016 11:40 AM

## 2016-03-02 NOTE — Patient Instructions (Signed)
STOP the metoprolol.  Call Dr Tomi Likens to schedule an appointment.  If her blood pressure is still low, please come back in the next 1-2 weeks, otherwise I will see you again in a few months.  Take care,  Dr Jerline Pain

## 2016-03-02 NOTE — Assessment & Plan Note (Signed)
Currently stable. Has close follow up with psychiatry.

## 2016-03-02 NOTE — Assessment & Plan Note (Signed)
Has follow up with neurology scheduled.

## 2016-03-02 NOTE — Assessment & Plan Note (Signed)
Currently working with OT and PT. Likely as a component of orthostasis - will stop metoprolol today. Follow up in 1-2 weeks. Continue OT/PT.

## 2016-03-05 ENCOUNTER — Other Ambulatory Visit: Payer: Self-pay | Admitting: Neurology

## 2016-03-05 ENCOUNTER — Other Ambulatory Visit: Payer: Self-pay | Admitting: Family Medicine

## 2016-03-05 NOTE — Telephone Encounter (Signed)
PLAN: 1.  Aricept 10mg  at bedtime

## 2016-03-08 ENCOUNTER — Telehealth: Payer: Self-pay | Admitting: *Deleted

## 2016-03-08 NOTE — Telephone Encounter (Signed)
Marlis Edelson, RN with [facility] called to get orders for wound care. Pt has a wound on left elbow due to a fall.  Patient has been keeping wound clean and covered with a Band-Aid.  She stated she could get a wound care nurse to come and evaluate patient for recommendations.  She will be going back out next week to see patient again.   Left Caitlyn a voice message giving verbal orders by Dr. Jerline Pain to have wound care nurse evaluate patient and make recommendations.  Orders to faxed to [facility] to be signed.   Derl Barrow, RN

## 2016-03-08 NOTE — Telephone Encounter (Signed)
Will forward to MD to give verbal ok.  Shaye Lagace,CMA  

## 2016-03-08 NOTE — Telephone Encounter (Signed)
Ebony Hail, Speech Pathologist called requesting verbal orders for speech therapy.  Request is twice a week for 2 weeks for cognitive therapy.  Please call [phone].  Derl Barrow, RN

## 2016-03-09 ENCOUNTER — Encounter: Payer: Self-pay | Admitting: Neurology

## 2016-03-09 NOTE — Telephone Encounter (Signed)
LM for speech therapist on her confidential voicemail. Jazmin Hartsell,CMA

## 2016-03-09 NOTE — Telephone Encounter (Signed)
It is ok for her to have speech therapy.  Brittany Chung. Jerline Pain, [facility] Resident PGY-3 03/09/2016 9:57 AM

## 2016-03-13 ENCOUNTER — Ambulatory Visit (INDEPENDENT_AMBULATORY_CARE_PROVIDER_SITE_OTHER): Payer: Commercial Managed Care - HMO | Admitting: Neurology

## 2016-03-13 ENCOUNTER — Encounter: Payer: Self-pay | Admitting: Neurology

## 2016-03-13 VITALS — BP 124/62 | HR 73 | Ht 64.0 in | Wt 130.4 lb

## 2016-03-13 DIAGNOSIS — F310 Bipolar disorder, current episode hypomanic: Secondary | ICD-10-CM

## 2016-03-13 DIAGNOSIS — R296 Repeated falls: Secondary | ICD-10-CM

## 2016-03-13 DIAGNOSIS — F0390 Unspecified dementia without behavioral disturbance: Secondary | ICD-10-CM | POA: Diagnosis not present
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
## 2016-03-13 NOTE — Patient Instructions (Signed)
1.  We will check MRI of brain 2.  We will refer for neuropsychological testing 3.  No driving 4.  Continue Aricept  5.  Follow up after testing.

## 2016-03-16 ENCOUNTER — Telehealth (HOSPITAL_COMMUNITY): Payer: Self-pay

## 2016-03-19 ENCOUNTER — Telehealth: Payer: Self-pay | Admitting: *Deleted

## 2016-03-19 NOTE — Telephone Encounter (Signed)
Will forward to MD to give verbal ok.  Veneda Kirksey,CMA  

## 2016-03-19 NOTE — Telephone Encounter (Signed)
Physical Therapist with [facility] calling for verbal orders to extend PT 1/week x 3/weeks.

## 2016-03-20 NOTE — Telephone Encounter (Signed)
Tried calling back but the number listed is not in service.  Patient is ok to have PT extended.   Algis Greenhouse. Jerline Pain, [place] Resident PGY-3 03/20/2016 1:41 PM

## 2016-03-21 ENCOUNTER — Other Ambulatory Visit (HOSPITAL_COMMUNITY): Payer: Self-pay | Admitting: Psychiatry

## 2016-03-21 MED ORDER — TRAZODONE HCL 50 MG PO TABS: ORAL_TABLET | ORAL | 0 refills | Status: DC

## 2016-03-21 NOTE — Telephone Encounter (Signed)
Patient is not sleeping, her daughter called and asked if her mother can get something to help her sleep. Patient is currently on Zoloft 50 mg qd and Depakote 50 mg bid, she also takes requip. Please review and advise as Dr. Adele Schilder is not in today. Thank you

## 2016-03-21 NOTE — Telephone Encounter (Signed)
She can try Trazodone 25-50mg  at night for insomnia. Side effects including drowsiness and a fall. Ordered medication.

## 2016-03-22 ENCOUNTER — Telehealth: Payer: Self-pay | Admitting: Family Medicine

## 2016-03-22 NOTE — Telephone Encounter (Signed)
Speech therapist with [facility]: request for verbal orders for one additional visit address cognition.

## 2016-03-23 ENCOUNTER — Telehealth: Payer: Self-pay | Admitting: Family Medicine

## 2016-03-23 DIAGNOSIS — R531 Weakness: Secondary | ICD-10-CM

## 2016-03-23 NOTE — Telephone Encounter (Signed)
It is ok for patient to have an additional visit.  Algis Greenhouse. Jerline Pain, [place] Resident PGY-3 03/23/2016 2:42 PM

## 2016-03-23 NOTE — Telephone Encounter (Signed)
Will forward to MD.  Patient does have [place] insurance and may need to be ordered by a [place] certified provider. Brittany Chung,CMA

## 2016-03-23 NOTE — Telephone Encounter (Signed)
Order faxed to [facility] attn ashley. Jveon Pound,CMA

## 2016-03-23 NOTE — Telephone Encounter (Signed)
Order completed and given to [place].   Algis Greenhouse. Jerline Pain, [place] Resident PGY-3 03/23/2016 3:11 PM

## 2016-03-23 NOTE — Telephone Encounter (Signed)
Verbal orders given on therapists, Ebony Hail, confidential VM.

## 2016-03-23 NOTE — Telephone Encounter (Signed)
Brittany Chung from [facility] is requesting orders for a transport chair. [phone] [phone]. Please advise. Thanks! ep

## 2016-03-23 NOTE — Telephone Encounter (Signed)
Spoke with [facility] and received the correct number for Herbert Deaner [phone] and called him to give verbal ok for orders. Jazmin Hartsell,CMA

## 2016-03-26 ENCOUNTER — Ambulatory Visit
Admission: RE | Admit: 2016-03-26 | Discharge: 2016-03-26 | Disposition: A | Discharge status: Home / Self Care | Payer: Commercial Managed Care - HMO | Source: Ambulatory Visit | Attending: Neurology | Admitting: Neurology

## 2016-03-26 DIAGNOSIS — F310 Bipolar disorder, current episode hypomanic: Secondary | ICD-10-CM

## 2016-03-26 DIAGNOSIS — F0390 Unspecified dementia without behavioral disturbance: Secondary | ICD-10-CM

## 2016-03-27 ENCOUNTER — Telehealth: Payer: Self-pay

## 2016-03-27 NOTE — Telephone Encounter (Signed)
Pt is scheduled to f/u on 05/01/16 after neuropsychological testing. Will call pt w/ results after 9 a.m.

## 2016-03-27 NOTE — Telephone Encounter (Signed)
-----   Message from Pieter Partridge, DO sent at 03/27/2016  6:54 AM EDT ----- MRI is negative for stroke.  I would like to see her after neuropsychological testing.

## 2016-03-28 ENCOUNTER — Ambulatory Visit: Payer: Self-pay | Admitting: Internal Medicine

## 2016-03-28 NOTE — Telephone Encounter (Signed)
Results relayed. 

## 2016-04-09 ENCOUNTER — Telehealth: Payer: Self-pay | Admitting: *Deleted

## 2016-04-09 NOTE — Telephone Encounter (Signed)
Received fax from [facility] requesting refill on Accu-Chek Nano smartview meter, smartview test strips and fastclix lancets.  Form placed in provider box.  Derl Barrow, RN

## 2016-04-09 NOTE — Telephone Encounter (Signed)
Form completed and faxed.  Algis Greenhouse. Jerline Pain, [facility] Resident PGY-3 04/09/2016 2:54 PM

## 2016-04-10 ENCOUNTER — Ambulatory Visit (INDEPENDENT_AMBULATORY_CARE_PROVIDER_SITE_OTHER): Payer: Commercial Managed Care - HMO | Admitting: Psychology

## 2016-04-10 ENCOUNTER — Encounter: Payer: Self-pay | Admitting: Psychology

## 2016-04-10 DIAGNOSIS — F310 Bipolar disorder, current episode hypomanic: Secondary | ICD-10-CM

## 2016-04-10 DIAGNOSIS — I679 Cerebrovascular disease, unspecified: Secondary | ICD-10-CM

## 2016-04-10 DIAGNOSIS — F0390 Unspecified dementia without behavioral disturbance: Secondary | ICD-10-CM
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
## 2016-04-12 ENCOUNTER — Other Ambulatory Visit (HOSPITAL_COMMUNITY): Payer: Self-pay | Admitting: Psychiatry

## 2016-04-16 ENCOUNTER — Ambulatory Visit (INDEPENDENT_AMBULATORY_CARE_PROVIDER_SITE_OTHER): Payer: Commercial Managed Care - HMO | Admitting: Psychology

## 2016-04-16 DIAGNOSIS — F0390 Unspecified dementia without behavioral disturbance: Secondary | ICD-10-CM | POA: Diagnosis not present

## 2016-04-16 NOTE — Progress Notes (Signed)
   Neuropsychology Note  Brittany Chung returned today for 2 hours of neuropsychological testing with technician, [name], BS, under the supervision of Dr. Macarthur Critchley. The patient did not appear overtly distressed by the testing session, per behavioral observation or via self-report to the technician. Rest breaks were offered. Brittany Chung will return within 2 weeks for a feedback session with Dr. Si Raider at which time her test performances, clinical impressions and treatment recommendations will be reviewed in detail. The patient understands she can contact our office should she require our assistance before this time.  Full report to follow.
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
## 2016-04-19 ENCOUNTER — Encounter: Payer: Self-pay | Admitting: Psychology

## 2016-04-19 ENCOUNTER — Ambulatory Visit (INDEPENDENT_AMBULATORY_CARE_PROVIDER_SITE_OTHER): Payer: Commercial Managed Care - HMO | Admitting: Psychology

## 2016-04-19 DIAGNOSIS — I6782 Cerebral ischemia: Secondary | ICD-10-CM | POA: Diagnosis not present

## 2016-04-19 DIAGNOSIS — F3130 Bipolar disorder, current episode depressed, mild or moderate severity, unspecified: Secondary | ICD-10-CM

## 2016-04-19 DIAGNOSIS — F0150 Vascular dementia without behavioral disturbance: Secondary | ICD-10-CM

## 2016-04-19 NOTE — Patient Instructions (Signed)
Clinical Impressions: Mild subcortical dementia, likely vascular dementia. Bipolar disorder (by history), current episode depressed.  Results of the current evaluation reveal areas of cognitive impairment including slowed processing speed, reduced immediate recall of new information (although delayed recall/consolidation is intact), and reduced mental flexibility/set-shifting. Furthermore, there is evidence that her cognitive deficits are interfering with her ability to manage complex tasks, such as her medications, finances and appointments. As such, diagnostic criteria for a dementia syndrome are met.  The patient's cognitive profile is indicative of mild frontal-subcortical dysfunction, which could also explain her behavioral changes more recently (agitation, irritability). Given her cognitive profile, multiple vascular risk factors (TIAs, coronary artery disease) and moderate chronic small vessel ischemic disease on neuroimaging, I suspect vascular etiology. Bipolar disorder can also lead to frontal-subcortical dysfunction in later life so this may be a contributing factor as well. Her current mood is depressed, which is likely exacerbating cognitive dysfunction to some extent. Her cognitive profile is NOT reflective of medial temporal-lobe involvement or underlying Alzheimer's disease.    Recommendations/Plan: Based on the findings of the present evaluation, the following recommendations are offered:  1. Optimal control of vascular risk factors. Regular engagement in activities that provide safe cardiovascular exercise, mental stimulation and social interaction. 2. Continue psychiatry. Medication compliance for bipolar disorder is essential. 3. Reassure many areas of strength on testing, and not indicative of primary neurodegenerative condition such as Alzheimer's disease 4. Continue assistance with complex ADLs. Agree it is best if she does not drive, particularly given executive dysfunction.  5.  Hearing loss may contribute to reduced memory function in daily life - consider hearing aids. 6. Re-evaluation in one year

## 2016-04-23 ENCOUNTER — Other Ambulatory Visit: Payer: Self-pay | Admitting: *Deleted

## 2016-04-24 ENCOUNTER — Other Ambulatory Visit (HOSPITAL_COMMUNITY): Payer: Self-pay | Admitting: Psychiatry

## 2016-04-24 ENCOUNTER — Other Ambulatory Visit (HOSPITAL_COMMUNITY): Payer: Self-pay

## 2016-04-24 MED ORDER — TRAZODONE HCL 50 MG PO TABS: ORAL_TABLET | ORAL | 0 refills | Status: DC

## 2016-05-01 ENCOUNTER — Other Ambulatory Visit (INDEPENDENT_AMBULATORY_CARE_PROVIDER_SITE_OTHER): Payer: Commercial Managed Care - HMO

## 2016-05-01 ENCOUNTER — Encounter: Payer: Self-pay | Admitting: Neurology

## 2016-05-01 ENCOUNTER — Ambulatory Visit (INDEPENDENT_AMBULATORY_CARE_PROVIDER_SITE_OTHER): Payer: Commercial Managed Care - HMO | Admitting: Neurology

## 2016-05-01 VITALS — BP 122/48 | HR 67 | Ht 63.0 in | Wt 137.0 lb

## 2016-05-01 DIAGNOSIS — F0150 Vascular dementia without behavioral disturbance: Secondary | ICD-10-CM

## 2016-05-01 DIAGNOSIS — F310 Bipolar disorder, current episode hypomanic: Secondary | ICD-10-CM

## 2016-05-01 DIAGNOSIS — R413 Other amnesia: Secondary | ICD-10-CM | POA: Diagnosis not present

## 2016-05-01 LAB — VITAMIN B12: VITAMIN B 12: 775 pg/mL (ref 211–911)

## 2016-05-01 MED ORDER — MEMANTINE HCL 28 X 5 MG & 21 X 10 MG PO TABS: ORAL_TABLET | ORAL | 12 refills | Status: DC

## 2016-05-01 NOTE — Progress Notes (Signed)
NEUROLOGY FOLLOW UP OFFICE NOTE  Brittany Chung OL:[id number]  HISTORY OF PRESENT ILLNESS: Brittany Chung is a 75 year old right-handed woman with depression, Bipolar disorder, type 2 diabetes, hyperlipidemia, chronic urinary incontinence and history of TIAs and carotid artery disease who follows up for memory deficits and falls.  Brittany Chung is accompanied by her daughter who supplements history.   UPDATE: Brittany Chung underwent neurocognitive testing on 04/16/16, which was consistent with mild vascular dementia.   HISTORY: Brittany Chung began having memory problems about 3 years ago.  It has gradually gotten worse over time.  Brittany Chung reports word-finding difficulties.  Brittany Chung misplaces objects such as her keys or dentures.  In the past, Brittany Chung used to become disoriented while driving on familiar routes, but not so much anymore.  Brittany Chung frequently would forget to pay her bills on time, so her daughter watches over things.  However, Brittany Chung never got used to paying the bills since her husband passed away.  Now, her family has to do it for her.  Brittany Chung denies problems recognizing people.  Brittany Chung notes some difficulty recalling names of past friends whom Brittany Chung hasn't seen in years.  Brittany Chung is able to perform all of her ADLs.  Brittany Chung lives with her daughter and her family.  Brittany Chung denies hallucinations or delusions.  There is no known family history of dementia, however her parents passed away at a young age.   MRI of brain without contrast from 10/26/14 was personally reviewed and showed moderate chronic small vessel ischemic changes with remote lacunar infarcts in the right lenticular nucleus and left corona radiata.     Brittany Chung was hospitalized for presumed TIA presenting as weakness and slurred speech on 06/03/15.  MRI and MRA of head were personally reviewed and showed no acute stroke.  Carotid doppler showed no hemodynamically significant stenosis.  ECHO showed EF 60-65% with no cardiac source of emboli.  LDL was 57.  ASA was increased from 81mg   daily to 325mg  daily.  Brittany Chung was then admitted to the hospital on 08/03/15 for acute coronary syndrome, presenting as chest pain.  Brittany Chung has a stent and was placed on Brilinta.  Since the heart attack, Brittany Chung has been more unsteady on her feet and has had frequent falls.     Brittany Chung is taking Aricept 10mg  daily.     Brittany Chung had a right CEA about 9 years ago.    Brittany Chung has her GED.  Brittany Chung took four years of college but did not graduate.  PAST MEDICAL HISTORY: Past Medical History:  Diagnosis Date  . Cancer ([place])    skin  . Carotid stenosis    s/p R CEA 2006  . Cataract    s/p surgery 2007  . Depression   . Diabetes mellitus without complication ([place])   . Hypertension   . Irregular heart beat   . Memory loss   . Stroke [facility]) 2006    MEDICATIONS: Current Outpatient Prescriptions on File Prior to Visit  Medication Sig Dispense Refill  . atorvastatin (LIPITOR) 80 MG tablet TAKE ONE TABLET BY MOUTH DAILY AT 6 PM 90 tablet 3  . b complex vitamins tablet Take 1 tablet by mouth daily.    . Coenzyme Q10 (CO Q-10) 100 MG CAPS Take 1 capsule by mouth daily.     . [facility] CHILDRENS ASPIRIN 81 MG chewable tablet TAKE ONE TABLET BY MOUTH DAILY 90 tablet 3  . Divalproex Sodium (DEPAKOTE PO) Take by mouth.    . enalapril (VASOTEC) 2.5 MG tablet  TAKE 1 TABLET BY MOUTH EVERY DAY 90 tablet 3  . Glucos-MSM-C-Mn-Ginger-Willow (MSM GLUCOSAMINE COMPLEX PO) Take 1 tablet by mouth daily. Reported on 08/03/2015    . Incontinence Supplies MISC Use as directed 1 each prn  . Incontinence Supply Disposable (REALITY INCONTINENT BRIEFS SM) MISC Use as needed. 1 each prn  . Multiple Vitamins-Minerals (MULTIVITAMIN WITH MINERALS) tablet Take 1 tablet by mouth every evening.     . Nutritional Supplements (GLUCERNA ADVANCE SHAKE) LIQD Take 1 Bottle by mouth daily. 12 Bottle 6  . rOPINIRole (REQUIP) 1 MG tablet Take 3 tablets (3 mg total) by mouth at bedtime. 180 tablet 3  . sertraline (ZOLOFT) 50 MG tablet Take 1 tablet (50 mg total) by  mouth daily. 30 tablet 2  . sitaGLIPtin (JANUVIA) 100 MG tablet Take 1 tablet (100 mg total) by mouth daily. 90 tablet 3  . ticagrelor (BRILINTA) 90 MG TABS tablet Take 1 tablet (90 mg total) by mouth 2 (two) times daily. 60 tablet 0  . traZODone (DESYREL) 50 MG tablet 25-50 mg at night as needed for insomnia 30 tablet 0  . Valproate Sodium (DEPAKENE) 250 MG/5ML SOLN solution TAKE 250 MG BY MOUTH 2 (TWO) TIMES DAILY. 473 mL 0  . [DISCONTINUED] rOPINIRole (REQUIP) 1 MG tablet Take 1 tablet (1 mg total) by mouth at bedtime. 30 tablet 3   No current facility-administered medications on file prior to visit.     ALLERGIES: Allergies  Allergen Reactions  . Codeine     REACTION: nausea/vomiting  . Neosporin [Neomycin-Bacitracin Zn-Polymyx]   . Petroleum Jelly [Petrolatum]   . Caine-1 [Lidocaine Hcl] Rash  . Clindamycin/Lincomycin Swelling  . Isothiazolinone Chloride Rash  . Neomycin Sulfate Rash  . Phenylene Rash    Phenylenediamine  . Quaternium-15 Rash    FAMILY HISTORY: Family History  Problem Relation Age of Onset  . Cancer Mother     Breast  . Aneurysm Father   . Cancer Sister     lung cancer  . Cancer Daughter     colon cancer  . Diabetes Maternal Grandmother   . Cancer Maternal Grandfather     lung  . Cancer Paternal Grandmother     mouth     SOCIAL HISTORY: Social History   Social History  . Marital status: Married    Spouse name: N/A  . Number of children: N/A  . Years of education: N/A   Occupational History  . Not on file.   Social History Main Topics  . Smoking status: Never Smoker  . Smokeless tobacco: Never Used  . Alcohol use No  . Drug use: No  . Sexual activity: Not Currently   Other Topics Concern  . Not on file   Social History Narrative   Lives with: daughter and  5 grandchildren   Hobbies: ballroom dance   Religion: Mormon             REVIEW OF SYSTEMS: Constitutional: No fevers, chills, or sweats, no generalized fatigue, change  in appetite Eyes: No visual changes, double vision, eye pain Ear, nose and throat: No hearing loss, ear pain, nasal congestion, sore throat Cardiovascular: No chest pain, palpitations Respiratory:  No shortness of breath at rest or with exertion, wheezes GastrointestinaI: No nausea, vomiting, diarrhea, abdominal pain, fecal incontinence Genitourinary:  No dysuria, urinary retention or frequency Musculoskeletal:  No neck pain, back pain Integumentary: No rash, pruritus, skin lesions Neurological: as above Psychiatric: No depression, insomnia, anxiety Endocrine: No palpitations, fatigue, diaphoresis, mood swings,  change in appetite, change in weight, increased thirst Hematologic/Lymphatic:  No purpura, petechiae. Allergic/Immunologic: no itchy/runny eyes, nasal congestion, recent allergic reactions, rashes  PHYSICAL EXAM: Vitals:   05/01/16 1325  BP: (!) 122/48  Pulse: 67   General: No acute distress.  Patient appears well-groomed.  normal body habitus. Head:  Normocephalic/atraumatic  IMPRESSION: Mild vascular dementia Bipolar affective disorder  PLAN: 1.  Will remain on Aricept, since there may be modest benefit with vascular dementia. 2.  Optimize control of stroke risk factors:  ASA 81mg  daily, statin therapy, blood pressure control, Mediterranean diet, cardiovascular exercise 3.  Encourage mental stimulation (reading, puzzles) as well as social interaction. 4.  Since Brittany Chung has frequent diarrhea due to IBS and history of anemia, we will check B12 level to look for deficiency. 6.  Recommend hearing assessment and use of hearing aids if needed 7.  Psychiatric care 8.  Follow up in 9 months and repeat cognitive testing at that time.  27 minutes spent face to face with patient, 100% spent counseling.   Metta Clines, DO  CC:  Dimas Chyle, MD

## 2016-05-01 NOTE — Addendum Note (Signed)
Addended by: Gerda Diss A on: 05/01/2016 02:53 PM   Modules accepted: Orders

## 2016-05-01 NOTE — Patient Instructions (Addendum)
1.  Stop Aricept.  Instead, I want you to start start memantine ER (Namenda XR) 7mg  tablets.  We will increase dose to goal of 28mg  daily, as directed.  Side effects include dizziness, headache, diarrhea or constipation.  Call with any questions or concerns. 2.  Continue aspirin daily, cholesterol medication, blood pressure control and diabetes control 3.  Check B12 4.  Recommend Mediterranean diet  Mediterranean Diet A Mediterranean diet refers to food and lifestyle choices that are based on the traditions of countries located on the [employer]. This way of eating has been shown to help prevent certain conditions and improve outcomes for people who have chronic diseases, like kidney disease and heart disease. What are tips for following this plan? Lifestyle  Cook and eat meals together with your family, when possible.  Drink enough fluid to keep your urine clear or pale yellow.  Be physically active every day. This includes:  Aerobic exercise like running or swimming.  Leisure activities like gardening, walking, or housework.  Get 7-8 hours of sleep each night.  If recommended by your health care provider, drink red wine in moderation. This means 1 glass a day for nonpregnant women and 2 glasses a day for men. A glass of wine equals 5 oz (150 mL). Reading food labels  Check the serving size of packaged foods. For foods such as rice and pasta, the serving size refers to the amount of cooked product, not dry.  Check the total fat in packaged foods. Avoid foods that have saturated fat or trans fats.  Check the ingredients list for added sugars, such as corn syrup. Shopping  At the grocery store, buy most of your food from the areas near the walls of the store. This includes:  Fresh fruits and vegetables (produce).  Grains, beans, nuts, and seeds. Some of these may be available in unpackaged forms or large amounts (in bulk).  Fresh seafood.  Poultry and eggs.  Low-fat  dairy products.  Buy whole ingredients instead of prepackaged foods.  Buy fresh fruits and vegetables in-season from local farmers markets.  Buy frozen fruits and vegetables in resealable bags.  If you do not have access to quality fresh seafood, buy precooked frozen shrimp or canned fish, such as tuna, salmon, or sardines.  Buy small amounts of raw or cooked vegetables, salads, or olives from the deli or salad bar at your store.  Stock your pantry so you always have certain foods on hand, such as olive oil, canned tuna, canned tomatoes, rice, pasta, and beans. Cooking  Cook foods with extra-virgin olive oil instead of using butter or other vegetable oils.  Have meat as a side dish, and have vegetables or grains as your main dish. This means having meat in small portions or adding small amounts of meat to foods like pasta or stew.  Use beans or vegetables instead of meat in common dishes like chili or lasagna.  Experiment with different cooking methods. Try roasting or broiling vegetables instead of steaming or sauteing them.  Add frozen vegetables to soups, stews, pasta, or rice.  Add nuts or seeds for added healthy fat at each meal. You can add these to yogurt, salads, or vegetable dishes.  Marinate fish or vegetables using olive oil, lemon juice, garlic, and fresh herbs. Meal planning  Plan to eat 1 vegetarian meal one day each week. Try to work up to 2 vegetarian meals, if possible.  Eat seafood 2 or more times a week.  Have healthy  snacks readily available, such as:  Vegetable sticks with hummus.  Greek yogurt.  Fruit and nut trail mix.  Eat balanced meals throughout the week. This includes:  Fruit: 2-3 servings a day  Vegetables: 4-5 servings a day  Low-fat dairy: 2 servings a day  Fish, poultry, or lean meat: 1 serving a day  Beans and legumes: 2 or more servings a week  Nuts and seeds: 1-2 servings a day  Whole grains: 6-8 servings a  day  Extra-virgin olive oil: 3-4 servings a day  Limit red meat and sweets to only a few servings a month What are my food choices?  Mediterranean diet  Recommended  Grains: Whole-grain pasta. Brown rice. Bulgar wheat. Polenta. Couscous. Whole-wheat bread. [place].  Vegetables: Artichokes. Beets. Broccoli. Cabbage. Carrots. Eggplant. Green beans. Chard. Kale. Spinach. Onions. Leeks. Peas. Squash. Tomatoes. Peppers. Radishes.  Fruits: Apples. Apricots. Avocado. Berries. Bananas. Cherries. Dates. Figs. Grapes. Lemons. Melon. Oranges. Peaches. Plums. Pomegranate.  Meats and other protein foods: Beans. Almonds. Sunflower seeds. Pine nuts. Peanuts. [place]. Salmon. Scallops. Shrimp. [place]. Tilapia. Clams. Oysters. Eggs.  Dairy: Low-fat milk. Cheese. Greek yogurt.  Beverages: Water. Red wine. Herbal tea.  Fats and oils: Extra virgin olive oil. Avocado oil. Grape seed oil.  Sweets and desserts: [place] yogurt with honey. Baked apples. Poached pears. Trail mix.  Seasoning and other foods: Basil. Cilantro. Coriander. Cumin. Mint. Parsley. Sage. Rosemary. Tarragon. Garlic. Oregano. Thyme. Pepper. Balsalmic vinegar. Tahini. Hummus. Tomato sauce. Olives. Mushrooms.  Limit these  Grains: Prepackaged pasta or rice dishes. Prepackaged cereal with added sugar.  Vegetables: Deep fried potatoes (french fries).  Fruits: Fruit canned in syrup.  Meats and other protein foods: Beef. Pork. Lamb. Poultry with skin. Hot dogs. Berniece Salines.  Dairy: Ice cream. Sour cream. Whole milk.  Beverages: Juice. Sugar-sweetened soft drinks. Beer. Liquor and spirits.  Fats and oils: Butter. Canola oil. Vegetable oil. Beef fat (tallow). Lard.  Sweets and desserts: Cookies. Cakes. Pies. Candy.  Seasoning and other foods: Mayonnaise. Premade sauces and marinades.  The items listed may not be a complete list. Talk with your dietitian about what dietary choices are right for you. Summary  The Mediterranean diet  includes both food and lifestyle choices.  Eat a variety of fresh fruits and vegetables, beans, nuts, seeds, and whole grains.  Limit the amount of red meat and sweets that you eat.  Talk with your health care provider about whether it is safe for you to drink red wine in moderation. This means 1 glass a day for nonpregnant women and 2 glasses a day for men. A glass of wine equals 5 oz (150 mL). This information is not intended to replace advice given to you by your health care provider. Make sure you discuss any questions you have with your health care provider. Document Released: 01/12/2016 Document Revised: 02/14/2016 Document Reviewed: 01/12/2016 Elsevier Interactive Patient Education  2017 [place]. 5.  Follow up in 9 months.  Here is more information on Vascular dementia  Vascular Dementia Introduction Dementia is a condition in which a person has problems with thinking, memory, and behavior that are severe enough to interfere with daily life. Vascular dementia is a type of dementia. It results from brain damage that is caused by the brain not getting enough blood. Vascular dementia usually begins between 84 and 98 years of age. What are the causes? Vascular dementia is caused by conditions that lessen blood flow to the brain. Common causes include:  Multiple small strokes. These may  happen without symptoms (silent stroke).  Major stroke.  Damage to small blood vessels in the brain (cerebral small vessel disease). What increases the risk?  Advancing age.  Having had a stroke.  Having high blood pressure (hypertension) or high cholesterol.  Having a disease that affects the heart or blood vessels.  Smoking.  Having diabetes.  Being female.  Being obese.  Not being active.  Having depression. What are the signs or symptoms? Symptoms can vary a lot from one person to another. Symptoms may be mild or severe depending on the amount of damage and which parts of the  brain have been affected. Symptoms may begin suddenly or may develop gradually. Symptoms may remain stable, or they may get worse over time. Symptoms of vascular dementia may be similar to those of Alzheimer disease. The two conditions can occur together (mixed dementia). Symptoms of vascular dementia may include: Mental  Confusion.  Memory problems.  Poor attention and concentration.  Trouble understanding speech.  Depression.  Personality changes.  Trouble recognizing familiar people.  Agitation or aggression.  Paranoia.  Delusions or hallucinations. Physical  Weakness.  Poor balance.  Loss of bladder or bowel control (incontinence).  Unsteady walking (gait).  Speaking problems. Behavioral  Getting lost in familiar places.  Problems with planning and judgment.  Trouble following instructions.  Social problems.  Emotional outbursts.  Trouble with daily activities and self-care.  Problems handling money. How is this diagnosed? There is not a specific test to diagnose vascular dementia. The health care provider will consider the person's medical history and symptoms or changes that are reported by friends and family. The health care provider will do a physical exam and may order lab tests or other tests that check brain and nervous system function. Tests that may be done include:  Blood tests.  Brain imaging tests.  Tests of movement, speech, and other daily activities (neurological exam).  Tests of memory, thinking, and problem-solving (neuropsychological or neurocognitive testing). Diagnosis may involve several specialists. These may include a health care provider who specializes in the brain and nervous system (neurologist), a provider who specializes in disorders of the mind (psychiatrist), and a provider who focuses on speech and language changes (Electrical engineer). How is this treated? There is no cure for vascular dementia. Brain damage that has  already occurred cannot be reversed. Treatment depends on:  How severe the condition is.  Which parts of the brain have been affected.  The person's overall health. Treatment measures aim to:  Treat the underlying cause of vascular dementia and manage risk factors. This may include:  Controlling blood pressure.  Lowering cholesterol.  Treating diabetes.  Quitting smoking.  Losing weight.  Manage symptoms.  Prevent further brain damage.  Improve the person's health and quality of life. Treatment for dementia may involve a team of health care providers, including:  A neurologist.  A psychiatrist.  An occupational therapist.  A speech pathologist.  A cardiologist.  An exercise physiologist or physical therapist. Follow these instructions at home: Home care for a person with vascular dementia depends on what caused the condition and how severe the symptoms are. General guidelines for care at home include:  Following the health care provider's instructions for treating the condition that caused the dementia.  Using medicines only as told by the person's health care provider.  Creating a safe living space.  Learning ways to help the person remember people, appointments, and daily activities.  Finding a support group to help caregivers and  family to cope with the effects of dementia.  Helping family and friends learn about ways to communicate with a person who has dementia.  Making sure the person keeps all follow-up visits and goes to all rehabilitation appointments as told by the health care team. This is important. Contact a health care provider if:  A fever develops.  New behavioral problems develop.  Problems with swallowing develop.  Confusion gets worse.  Sleepiness gets worse. Get help right away if:  Loss of consciousness occurs.  There is a sudden loss of speech, balance, or thinking ability.  New numbness or paralysis occurs.  Sudden,  severe headache occurs.  Vision is lost or suddenly gets worse in one or both eyes. This information is not intended to replace advice given to you by your health care provider. Make sure you discuss any questions you have with your health care provider. Document Released: 05/11/2002 Document Revised: 10/27/2015 Document Reviewed: 09/01/2014  2017 Elsevier

## 2016-05-02 ENCOUNTER — Telehealth: Payer: Self-pay

## 2016-05-02 NOTE — Telephone Encounter (Signed)
-----   Message from Pieter Partridge, DO sent at 05/02/2016  7:59 AM EST ----- b12 is normal

## 2016-05-02 NOTE — Telephone Encounter (Signed)
Message relayed to patient. Verbalized understanding and denied questions.   

## 2016-05-04 ENCOUNTER — Telehealth: Payer: Self-pay | Admitting: Family Medicine

## 2016-05-04 NOTE — Telephone Encounter (Signed)
Daughter called because [facility] is waiting on progress notes from the doctor on her mother condition. Please fax them to [phone] attention [place]. They would like to have the notes from September through now. [email or web address]

## 2016-05-04 NOTE — Telephone Encounter (Signed)
Last office note with PCP printed and faxed to [facility]

## 2016-05-10 ENCOUNTER — Encounter (HOSPITAL_COMMUNITY): Payer: Self-pay | Admitting: Psychiatry

## 2016-05-10 ENCOUNTER — Ambulatory Visit (INDEPENDENT_AMBULATORY_CARE_PROVIDER_SITE_OTHER): Payer: Commercial Managed Care - HMO | Admitting: Psychiatry

## 2016-05-10 VITALS — BP 122/68 | HR 68 | Ht 63.5 in | Wt 134.0 lb

## 2016-05-10 DIAGNOSIS — F310 Bipolar disorder, current episode hypomanic: Secondary | ICD-10-CM

## 2016-05-10 DIAGNOSIS — Z803 Family history of malignant neoplasm of breast: Secondary | ICD-10-CM | POA: Diagnosis not present

## 2016-05-10 DIAGNOSIS — Z801 Family history of malignant neoplasm of trachea, bronchus and lung: Secondary | ICD-10-CM

## 2016-05-10 DIAGNOSIS — Z79899 Other long term (current) drug therapy: Secondary | ICD-10-CM | POA: Diagnosis not present

## 2016-05-10 DIAGNOSIS — Z802 Family history of malignant neoplasm of other respiratory and intrathoracic organs: Secondary | ICD-10-CM

## 2016-05-10 MED ORDER — SERTRALINE HCL 50 MG PO TABS: 50.0000 mg | ORAL_TABLET | Freq: Every day | ORAL | 2 refills | Status: DC

## 2016-05-10 MED ORDER — TRAZODONE HCL 50 MG PO TABS: ORAL_TABLET | ORAL | 1 refills | Status: DC

## 2016-05-10 MED ORDER — VALPROATE SODIUM 250 MG/5ML PO SOLN: 250.0000 mg | Freq: Two times a day (BID) | ORAL | 1 refills | Status: DC

## 2016-05-10 NOTE — Progress Notes (Signed)
BH MD/PA/NP OP Progress Note  05/10/2016 9:54 AM Brittany Chung  MRN:  [id number]  Chief Complaint:  Subjective:  I lost my 77 year old grandson last month.  I was sad but am handing better.  HPI: Brittany Chung came with her daughter for her follow-up appointment.  She usually comes with her daughter Brittany Chung however today she came with her older daughter.  Her daughter endorsed that recently patient's 67 year old grandson died who was struggling with psychiatric illness.  It is unclear if he committed suicide as he found on train track Saturday morning.  Patient was devastated because that was her first grandson.  Patient told that whole family went through a lot and now slowly and gradually they're coming together.  Most of the information was obtained from her daughter.  Her daughter patient is taking her medication and seems like Depakote working very well.  She is not irritable, angry, manic and sleeping better.  She does take trazodone half to one tablet at bedtime .  Her physical condition is also improved.  She has no more dizziness and does not require any wheelchair.  Her appetite is good.  She denies any crying spells, feeling of hopelessness or worthlessness.  However she does endorse grief and loss of grandson.  She is trying to get in the pace program for seizures and is on and she is currently wait list.  She is hoping to start in February. She recently seen her neurologist and there were no changes and recommended to continue Aricept.  Her memory continues to get worse.  She continued to have difficulty remembering thing and she was disappointed because she was not allowed to drive.  Overall her gait is improved and she has not fall in recent weeks.  She has no tremors shakes or any EPS.  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  Her energy level is fair.  Her vital signs are stable.  Visit Diagnosis:    ICD-9-CM ICD-10-CM   1. Bipolar  affective disorder, current episode hypomanic ([place]) 296.40 F31.0 Valproate Sodium (DEPAKENE) 250 MG/5ML SOLN solution     traZODone (DESYREL) 50 MG tablet     sertraline (ZOLOFT) 50 MG tablet  2. Encounter for long-term (current) use of medications V58.69 Z79.899 Valproic acid level     COMPLETE METABOLIC PANEL WITH GFR     Hemoglobin A1c     CBC with Differential/Platelet    Past Psychiatric History: Patient has history of bipolar disorder for a long time but she feels embarrassed accepted.  In the past she was given Seroquel by primary care physician but she stopped because of sedation.  He also tried Vyvanse to help her ADD but it make her manic and is a stop due to her cardiac history.  Patient denies any history of suicidal attempt or any inpatient psychiatric treatment.  Past Medical History:  Her primary care physician is Dr. Jerline Pain.  She also see Dr. Susa Raring for neurology. Past Medical History:  Diagnosis Date  . Cancer ([place])    skin  . Carotid stenosis    s/p R CEA 2006  . Cataract    s/p surgery 2007  . Depression   . Diabetes mellitus without complication ([place])   . Hypertension   . Irregular heart beat   . Memory loss   . Stroke [facility]) 2006    Past Surgical History:  Procedure Laterality Date  . BASAL CELL CARCINOMA EXCISION  02/18/2003  .  CARDIAC CATHETERIZATION N/A 08/05/2015   Procedure: Left Heart Cath and Coronary Angiography;  Surgeon: Leonie Man, MD;  Location: [facility];  Service: Cardiovascular;  Laterality: N/A;  . CARDIAC CATHETERIZATION Right 08/05/2015   Procedure: Coronary Stent Intervention;  Surgeon: Leonie Man, MD;  Location: [place] [facility];  Service: Cardiovascular;  Laterality: Right;  . CAROTID ENDARTERECTOMY  05/04/2005   R stenosis >80% on 03/16/2005  . CATARACT EXTRACTION    . CORONARY ANGIOPLASTY    . GLAUCOMA SURGERY  06/25/2005    Family Psychiatric History: See below.  Family History:  Family History  Problem Relation  Age of Onset  . Cancer Mother     Breast  . Aneurysm Father   . Cancer Sister     lung cancer  . Cancer Daughter     colon cancer  . Diabetes Maternal Grandmother   . Cancer Maternal Grandfather     lung  . Cancer Paternal Grandmother     mouth     Social History:  Social History   Social History  . Marital status: Married    Spouse name: N/A  . Number of children: N/A  . Years of education: N/A   Social History Main Topics  . Smoking status: Never Smoker  . Smokeless tobacco: Never Used  . Alcohol use No  . Drug use: No  . Sexual activity: Not Currently   Other Topics Concern  . Not on file   Social History Narrative   Lives with: daughter and  5 grandchildren   Hobbies: ballroom dance   Religion: [employer]             Allergies:  Allergies  Allergen Reactions  . Codeine     REACTION: nausea/vomiting  . Neosporin [Neomycin-Bacitracin Zn-Polymyx]   . Petroleum Jelly [Petrolatum]   . Caine-1 [Lidocaine Hcl] Rash  . Clindamycin/Lincomycin Swelling  . Isothiazolinone Chloride Rash  . Neomycin Sulfate Rash  . Phenylene Rash    Phenylenediamine  . [id number] Rash    Metabolic Disorder Labs: Lab Results  Component Value Date   HGBA1C 6.8 12/15/2015   MPG 148 06/04/2015   No results found for: PROLACTIN Lab Results  Component Value Date   CHOL 136 06/04/2015   TRIG 94 06/04/2015   HDL 60 06/04/2015   CHOLHDL 2.3 06/04/2015   VLDL 19 06/04/2015   LDLCALC 57 06/04/2015   LDLCALC 85 09/28/2014     Current Medications: Current Outpatient Prescriptions  Medication Sig Dispense Refill  . atorvastatin (LIPITOR) 80 MG tablet TAKE ONE TABLET BY MOUTH DAILY AT 6 PM 90 tablet 3  . b complex vitamins tablet Take 1 tablet by mouth daily.    . Coenzyme Q10 (CO Q-10) 100 MG CAPS Take 1 capsule by mouth daily.     . [employer] CHILDRENS ASPIRIN 81 MG chewable tablet TAKE ONE TABLET BY MOUTH DAILY 90 tablet 3  . donepezil (ARICEPT) 10 MG tablet Take 10 mg by mouth  at bedtime.    . enalapril (VASOTEC) 2.5 MG tablet TAKE 1 TABLET BY MOUTH EVERY DAY 90 tablet 3  . Glucos-MSM-C-Mn-Ginger-Willow (MSM GLUCOSAMINE COMPLEX PO) Take 1 tablet by mouth daily. Reported on 08/03/2015    . Incontinence Supplies MISC Use as directed 1 each prn  . Incontinence Supply Disposable (REALITY INCONTINENT BRIEFS SM) MISC Use as needed. 1 each prn  . Multiple Vitamins-Minerals (MULTIVITAMIN WITH MINERALS) tablet Take 1 tablet by mouth every evening.     Marland Kitchen MYRBETRIQ  25 MG TB24 tablet     . Nutritional Supplements (GLUCERNA ADVANCE SHAKE) LIQD Take 1 Bottle by mouth daily. 12 Bottle 6  . rOPINIRole (REQUIP) 1 MG tablet Take 3 tablets (3 mg total) by mouth at bedtime. 180 tablet 3  . sertraline (ZOLOFT) 50 MG tablet Take 1 tablet (50 mg total) by mouth daily. 30 tablet 2  . sitaGLIPtin (JANUVIA) 100 MG tablet Take 1 tablet (100 mg total) by mouth daily. 90 tablet 3  . ticagrelor (BRILINTA) 90 MG TABS tablet Take 1 tablet (90 mg total) by mouth 2 (two) times daily. 60 tablet 0  . traZODone (DESYREL) 50 MG tablet 25-50 mg at night as needed for insomnia 30 tablet 1  . Valproate Sodium (DEPAKENE) 250 MG/5ML SOLN solution Take 5 mLs (250 mg total) by mouth 2 (two) times daily. 473 mL 1  . [DISCONTINUED] rOPINIRole (REQUIP) 1 MG tablet Take 1 tablet (1 mg total) by mouth at bedtime. 30 tablet 3   No current facility-administered medications for this visit.     Neurologic: Headache: No Seizure: No Paresthesias: No  Musculoskeletal: Strength & Muscle Tone: within normal limits Gait & Station: normal Patient leans: N/A  Psychiatric Specialty Exam: Review of Systems  Constitutional: Negative.   HENT: Positive for hearing loss.   Eyes: Negative.   Respiratory: Negative.   Cardiovascular: Negative.   Gastrointestinal: Negative.   Genitourinary:       Incontinence of urine  Musculoskeletal: Negative.   Skin: Negative.   Neurological: Negative.  Negative for dizziness,  tingling and tremors.  Psychiatric/Behavioral: Positive for memory loss.    There were no vitals taken for this visit.There is no height or weight on file to calculate BMI.  General Appearance: Well Groomed  Eye Contact:  Fair  Speech:  Slow  Volume:  Normal  Mood:  Dysphoric  Affect:  Congruent  Thought Process:  Goal Directed  Orientation:  Full (Time, Place, and Person)  Thought Content: WDL and Logical   Suicidal Thoughts:  No  Homicidal Thoughts:  No  Memory:  Immediate;   Poor Recent;   Fair Remote;   Fair  Judgement:  Fair  Insight:  Fair  Psychomotor Activity:  Decreased  Concentration:  Concentration: Poor and Attention Span: Poor  Recall:  Poor  Fund of Knowledge: Fair  Language: Good  Akathisia:  No  Handed:  Right  AIMS (if indicated):  None reported   Assets:  Desire for Improvement Housing Social Support  ADL's:  Intact  Cognition: Impaired,  Moderate  Sleep:  Good      Assessment: Saniya a 75 year old outpatient female who has history of bipolar disorder, cognitive disorder, ADD by history and recently grief due to loss of her young grandson.  Plan; I reviewed records from her neurologist and recent blood work results.  Her hemoglobin A1c is 6.8.  She does not have any Depakote level.  She is doing very well on Depakote liquid twice a day.  She has no tremors shakes or any EPS.  Her sleep is also good.  I will continue Depakote liquid 250 mg twice a day, Zoloft 50 mg daily and trazodone 25-50 mg at bedtime.  At this time he does not have any concerns.  We will do blood work Depakote level and compliance of metabolic panel.  I also discuss to see therapist in this office for grief counseling.  Patient is waitlisted in pace program which may start in February.  Recommended to call us back  if she has any question, concern if she feels worsening of the symptom.  Time spent 25 minutes.  More than 50% of the time spent in psychoeducation, counseling and correlation  of care.  We also discussed psychosocial stressors due to recent loss of his grandson.  I will see her in 3 months.   Brittany Chung T., MD 05/10/2016, 9:54 AM

## 2016-05-14 ENCOUNTER — Other Ambulatory Visit: Payer: Self-pay | Admitting: Family Medicine

## 2016-06-07 ENCOUNTER — Ambulatory Visit (HOSPITAL_COMMUNITY): Payer: Self-pay | Admitting: Psychiatry

## 2016-06-30 ENCOUNTER — Other Ambulatory Visit: Payer: Self-pay | Admitting: Internal Medicine

## 2016-06-30 ENCOUNTER — Other Ambulatory Visit: Payer: Self-pay | Admitting: Neurology

## 2016-08-06 ENCOUNTER — Other Ambulatory Visit: Payer: Self-pay

## 2016-08-08 ENCOUNTER — Encounter (HOSPITAL_COMMUNITY): Payer: Self-pay | Admitting: Psychiatry

## 2016-08-08 ENCOUNTER — Ambulatory Visit (INDEPENDENT_AMBULATORY_CARE_PROVIDER_SITE_OTHER): Payer: Medicare (Managed Care) | Admitting: Psychiatry

## 2016-08-08 ENCOUNTER — Encounter (INDEPENDENT_AMBULATORY_CARE_PROVIDER_SITE_OTHER): Payer: Self-pay

## 2016-08-08 VITALS — BP 126/70 | HR 72 | Ht 63.5 in | Wt 131.4 lb

## 2016-08-08 DIAGNOSIS — Z79899 Other long term (current) drug therapy: Secondary | ICD-10-CM

## 2016-08-08 DIAGNOSIS — F4321 Adjustment disorder with depressed mood: Secondary | ICD-10-CM | POA: Diagnosis not present

## 2016-08-08 DIAGNOSIS — Z888 Allergy status to other drugs, medicaments and biological substances status: Secondary | ICD-10-CM

## 2016-08-08 DIAGNOSIS — F310 Bipolar disorder, current episode hypomanic: Secondary | ICD-10-CM

## 2016-08-08 MED ORDER — SERTRALINE HCL 50 MG PO TABS: 75.0000 mg | ORAL_TABLET | Freq: Every day | ORAL | 2 refills | Status: DC

## 2016-08-08 MED ORDER — VALPROATE SODIUM 250 MG/5ML PO SOLN: 250.0000 mg | Freq: Two times a day (BID) | ORAL | 1 refills | Status: DC

## 2016-08-08 MED ORDER — TRAZODONE HCL 50 MG PO TABS: ORAL_TABLET | ORAL | 1 refills | Status: DC

## 2016-08-08 NOTE — Progress Notes (Signed)
BH MD/PA/NP OP Progress Note  08/08/2016 11:35 AM Brittany Chung  MRN:  [id number]  Chief Complaint:  Subjective:  I started the program.  I still feel sad and depressed the days I don't go to the program.  HPI: Brittany Chung came for her follow-up appointment with her two daughter.  As per her daughter she just started the program pace and going there 2 days a week.  She really enjoyed the program but the days she don't go to the program she gets very sad depressed and isolated and does not leave her bed.  She also endorse restless leg and the night.  She is taking Requip but sometime feeling is not helping as much.  She continued to ruminate about her grant son who died 2 months ago.  They still not sure if it was a suicide or accidental overdose.  His autopsy still pending.  Patient forgot blood work including Depakote level but family promised that they will do it before her next appointment.  Overall patient slowly and gradually getting better.  She denies any irritability, anger, mania, psychosis, crying spells but does feel sometimes sad and depressed.  Her energy level is fair.  She continues to have memory impairment and easily forgetful.  She takes trazodone half to one tablet which is helping her sleep.  Her appetite is fair.  She does not drive and her daughter takes her to the doctor's appointment.  Since the last visit she has not fallen and her gait is improved overall.  Patient has no tremors, shakes, EPS or any rash.  Visit Diagnosis:    ICD-9-CM ICD-10-CM   1. Bipolar affective disorder, current episode hypomanic ([facility]) 296.40 F31.0 sertraline (ZOLOFT) 50 MG tablet     traZODone (DESYREL) 50 MG tablet     Valproate Sodium (DEPAKENE) 250 MG/5ML SOLN solution  2. Encounter for long-term (current) use of medications V58.69 Z79.899 Valproic acid level     CBC with Differential/Platelet     COMPLETE METABOLIC PANEL WITH GFR     Hemoglobin A1c    Past Psychiatric History: Reviewed. Patient  has history of bipolar disorder for a long time but she feels embarrassed accepted.  In the past she was given Seroquel by primary care physician but she stopped because of sedation.  He also tried Vyvanse to help her ADD but it make her manic and is a stop due to her cardiac history.  Patient denies any history of suicidal attempt or any inpatient psychiatric treatment  Past Medical History:  Past Medical History:  Diagnosis Date  . Cancer ([place])    skin  . Carotid stenosis    s/p R CEA 2006  . Cataract    s/p surgery 2007  . Depression   . Diabetes mellitus without complication ([place])   . Hypertension   . Irregular heart beat   . Memory loss   . Stroke [facility]) 2006    Past Surgical History:  Procedure Laterality Date  . BASAL CELL CARCINOMA EXCISION  02/18/2003  . CARDIAC CATHETERIZATION N/A 08/05/2015   Procedure: Left Heart Cath and Coronary Angiography;  Surgeon: Leonie Man, MD;  Location: [place];  Service: Cardiovascular;  Laterality: N/A;  . CARDIAC CATHETERIZATION Right 08/05/2015   Procedure: Coronary Stent Intervention;  Surgeon: Leonie Man, MD;  Location: [place] [facility];  Service: Cardiovascular;  Laterality: Right;  . CAROTID ENDARTERECTOMY  05/04/2005   R stenosis >80% on 03/16/2005  . CATARACT EXTRACTION    .  CORONARY ANGIOPLASTY    . GLAUCOMA SURGERY  06/25/2005    Family Psychiatric History: Reviewed.  Family History:  Family History  Problem Relation Age of Onset  . Cancer Mother     Breast  . Aneurysm Father   . Cancer Sister     lung cancer  . Cancer Daughter     colon cancer  . Diabetes Maternal Grandmother   . Cancer Maternal Grandfather     lung  . Cancer Paternal Grandmother     mouth     Social History:  Social History   Social History  . Marital status: Married    Spouse name: N/A  . Number of children: N/A  . Years of education: N/A   Social History Main Topics  . Smoking status: Never Smoker  . Smokeless  tobacco: Never Used  . Alcohol use No  . Drug use: No  . Sexual activity: Not Currently   Other Topics Concern  . Not on file   Social History Narrative   Lives with: daughter and  5 grandchildren   Hobbies: ballroom dance   Religion: Mormon             Allergies:  Allergies  Allergen Reactions  . Codeine     REACTION: nausea/vomiting  . Neosporin [Neomycin-Bacitracin Zn-Polymyx]   . Petroleum Jelly [Petrolatum]   . Caine-1 [Lidocaine Hcl] Rash  . Clindamycin/Lincomycin Swelling  . Isothiazolinone Chloride Rash  . Neomycin Sulfate Rash  . Phenylene Rash    Phenylenediamine  . [id number] Rash    Metabolic Disorder Labs: Lab Results  Component Value Date   HGBA1C 6.8 12/15/2015   MPG 148 06/04/2015   No results found for: PROLACTIN Lab Results  Component Value Date   CHOL 136 06/04/2015   TRIG 94 06/04/2015   HDL 60 06/04/2015   CHOLHDL 2.3 06/04/2015   VLDL 19 06/04/2015   LDLCALC 57 06/04/2015   LDLCALC 85 09/28/2014     Current Medications: Current Outpatient Prescriptions  Medication Sig Dispense Refill  . atorvastatin (LIPITOR) 80 MG tablet TAKE ONE TABLET BY MOUTH DAILY AT 6 PM 90 tablet 3  . b complex vitamins tablet Take 1 tablet by mouth daily.    Marland Kitchen BRILINTA 90 MG TABS tablet TAKE 1 TABLET BY MOUTH 2 TIMES DAILY. 60 tablet 11  . Coenzyme Q10 (CO Q-10) 100 MG CAPS Take 1 capsule by mouth daily.     . [employer] CHILDRENS ASPIRIN 81 MG chewable tablet TAKE ONE TABLET BY MOUTH DAILY 90 tablet 3  . donepezil (ARICEPT) 10 MG tablet Take 10 mg by mouth at bedtime.    . donepezil (ARICEPT) 10 MG tablet TAKE 1 TABLET (10 MG TOTAL) BY MOUTH AT BEDTIME. 30 tablet 3  . enalapril (VASOTEC) 2.5 MG tablet TAKE 1 TABLET BY MOUTH EVERY DAY 90 tablet 3  . Glucos-MSM-C-Mn-Ginger-Willow (MSM GLUCOSAMINE COMPLEX PO) Take 1 tablet by mouth daily. Reported on 08/03/2015    . Incontinence Supplies MISC Use as directed 1 each prn  . Incontinence Supply Disposable (REALITY  INCONTINENT BRIEFS SM) MISC Use as needed. 1 each prn  . Multiple Vitamins-Minerals (MULTIVITAMIN WITH MINERALS) tablet Take 1 tablet by mouth every evening.     Marland Kitchen MYRBETRIQ 25 MG TB24 tablet     . Nutritional Supplements (GLUCERNA ADVANCE SHAKE) LIQD Take 1 Bottle by mouth daily. 12 Bottle 6  . rOPINIRole (REQUIP) 1 MG tablet Take 3 tablets (3 mg total) by mouth at bedtime. 180 tablet 3  .  sertraline (ZOLOFT) 50 MG tablet Take 1 tablet (50 mg total) by mouth daily. 30 tablet 2  . sitaGLIPtin (JANUVIA) 100 MG tablet Take 1 tablet (100 mg total) by mouth daily. 90 tablet 3  . traZODone (DESYREL) 50 MG tablet 25-50 mg at night as needed for insomnia 30 tablet 1  . Valproate Sodium (DEPAKENE) 250 MG/5ML SOLN solution Take 5 mLs (250 mg total) by mouth 2 (two) times daily. 473 mL 1  . [DISCONTINUED] rOPINIRole (REQUIP) 1 MG tablet Take 1 tablet (1 mg total) by mouth at bedtime. 30 tablet 3   No current facility-administered medications for this visit.     Neurologic: Headache: No Seizure: No Paresthesias: No  Musculoskeletal: Strength & Muscle Tone: decreased Gait & Station: normal Patient leans: N/A  Psychiatric Specialty Exam: Review of Systems  Constitutional: Negative.   HENT: Negative.   Cardiovascular: Negative for chest pain.  Gastrointestinal: Negative for heartburn.  Musculoskeletal: Negative.   Skin: Negative.   Neurological: Negative.   Psychiatric/Behavioral: Positive for depression and memory loss.    Blood pressure 126/70, pulse 72, height 5' 3.5" (1.613 m), weight 131 lb 6.4 oz (59.6 kg).Body mass index is 22.91 kg/m.  General Appearance: Casual  Eye Contact:  Fair  Speech:  Slow  Volume:  Normal  Mood:  Anxious  Affect:  Appropriate  Thought Process:  Goal Directed  Orientation:  Full (Time, Place, and Person)  Thought Content: Rumination   Suicidal Thoughts:  No  Homicidal Thoughts:  No  Memory:  Immediate;   Fair Recent;   Fair Remote;   Fair   Judgement:  Fair  Insight:  Good  Psychomotor Activity:  Decreased  Concentration:  Concentration: Fair and Attention Span: Fair  Recall:  [employer] of Knowledge: Fair  Language: Good  Akathisia:  No  Handed:  Right  AIMS (if indicated):  0  Assets:  Desire for Improvement Housing Social Support  ADL's:  Intact  Cognition: Impaired,  Moderate  Sleep:  fair    Assessment: Bipolar disorder mixed.  Cognitive disorder NOS.  ADD by history  Plan: Patient gradually improving from the past.  She started pace program but only going to days a week.  I recommended to see if she can attend more days since it is helping her depression.  I also suggested to increase Zoloft 75 mg to help the residual depression.  I also encouraged to see her primary care physician to adjust the dose of Requip as patient has restless leg.  Reassurance given.  Patient still has grief losing about her grandson.  Reminded blood work including Depakote level.  Patient is not interested in grief counseling since she is going to the pace program.  Patient's family had multiple questions related to her prognosis and medication side effects.  These questions were answered and it appears that family is satisfied with response.  I will see her again in 3 months. Discussed medication side effects and benefits.  Recommended to call us back if there is any question, concern or worsening of the symptoms.  Discuss safety plan that anytime having active suicidal thoughts or homicidal thoughts and she need to call 911 or go to the local emergency room.   ARFEEN,SYED T., MD 08/08/2016, 11:35 AM

## 2016-08-11 ENCOUNTER — Other Ambulatory Visit (HOSPITAL_COMMUNITY): Payer: Self-pay | Admitting: Psychiatry

## 2016-08-11 DIAGNOSIS — F310 Bipolar disorder, current episode hypomanic: Secondary | ICD-10-CM

## 2016-09-17 ENCOUNTER — Ambulatory Visit
Admission: RE | Admit: 2016-09-17 | Discharge: 2016-09-17 | Disposition: A | Discharge status: Home / Self Care | Payer: No Typology Code available for payment source | Source: Ambulatory Visit | Attending: Nurse Practitioner | Admitting: Nurse Practitioner

## 2016-09-17 ENCOUNTER — Other Ambulatory Visit: Payer: Self-pay | Admitting: Nurse Practitioner

## 2016-09-17 DIAGNOSIS — R0989 Other specified symptoms and signs involving the circulatory and respiratory systems: Secondary | ICD-10-CM

## 2016-09-28 ENCOUNTER — Telehealth: Payer: Self-pay

## 2016-09-28 NOTE — Telephone Encounter (Signed)
Receive Pre Operative Clearance Form from [place]/ [place].  Faxed completed form back to her attention.

## 2016-10-02 ENCOUNTER — Telehealth: Payer: Self-pay | Admitting: Interventional Cardiology

## 2016-10-02 NOTE — Telephone Encounter (Signed)
Received surgical clearance from [facility].  Called pt to schedule appt as she has not been seen in over a year.  Pt states that she decided not to proceed with this surgery.  Pt still wished to be scheduled to see Dr. Tamala Julian.  Pt scheduled for 10/26/16.

## 2016-10-26 ENCOUNTER — Encounter: Payer: Self-pay | Admitting: Interventional Cardiology

## 2016-10-26 ENCOUNTER — Encounter (INDEPENDENT_AMBULATORY_CARE_PROVIDER_SITE_OTHER): Payer: Self-pay

## 2016-10-26 ENCOUNTER — Ambulatory Visit (INDEPENDENT_AMBULATORY_CARE_PROVIDER_SITE_OTHER): Payer: Medicare (Managed Care) | Admitting: Interventional Cardiology

## 2016-10-26 ENCOUNTER — Ambulatory Visit: Payer: Self-pay | Admitting: Neurology

## 2016-10-26 VITALS — BP 142/74 | HR 63 | Ht 63.5 in | Wt 130.4 lb

## 2016-10-26 DIAGNOSIS — R0602 Shortness of breath: Secondary | ICD-10-CM

## 2016-10-26 DIAGNOSIS — E118 Type 2 diabetes mellitus with unspecified complications: Secondary | ICD-10-CM

## 2016-10-26 DIAGNOSIS — I5032 Chronic diastolic (congestive) heart failure: Secondary | ICD-10-CM

## 2016-10-26 DIAGNOSIS — I2510 Atherosclerotic heart disease of native coronary artery without angina pectoris: Secondary | ICD-10-CM | POA: Diagnosis not present

## 2016-10-26 DIAGNOSIS — I10 Essential (primary) hypertension: Secondary | ICD-10-CM

## 2016-10-26 MED ORDER — CLOPIDOGREL BISULFATE 75 MG PO TABS: 75.0000 mg | ORAL_TABLET | Freq: Every day | ORAL | 3 refills | Status: AC

## 2016-10-26 NOTE — Progress Notes (Signed)
Cardiology Office Note    Date:  10/26/2016   ID:  Brittany Chung, DOB Apr 17, 1941, MRN [id number]  PCP:  Angelica Pou, MD  Cardiologist: Sinclair Grooms, MD   Chief Complaint  Patient presents with  . Coronary Artery Disease    Circumflex stent    History of Present Illness:  Brittany Chung is a 76 y.o. female with history of vascular disease including carotid endarterectomy 2006, and acute coronary syndrome in March 2017 receiving a DES in the circumflex and left with moderate LAD disease and chronic total occlusion of the RCA supplied by collaterals.  The patient was seen prior to stenting of the circumflex coronary artery in the setting of acute coronary syndrome. At discharge from the hospital she was seen as a TOC by Tenny Craw. Since that time she has not seen cardiology, which is been greater than 12 months. She denies cardiac complaints. She is accompanied by her daughter. She has history of memory loss following a stroke.  There are no specific medication side effects that she is aware of. She has had bilateral carotid endarterectomy and is in a [facility] program where chronic diabetes is being managed.   Past Medical History:  Diagnosis Date  . Cancer ([place])    skin  . Carotid stenosis    s/p R CEA 2006  . Cataract    s/p surgery 2007  . Depression   . Diabetes mellitus without complication ([place])   . Hypertension   . Irregular heart beat   . Memory loss   . Stroke [facility]) 2006    Past Surgical History:  Procedure Laterality Date  . BASAL CELL CARCINOMA EXCISION  02/18/2003  . CARDIAC CATHETERIZATION N/A 08/05/2015   Procedure: Left Heart Cath and Coronary Angiography;  Surgeon: Leonie Man, MD;  Location: [place] [facility];  Service: Cardiovascular;  Laterality: N/A;  . CARDIAC CATHETERIZATION Right 08/05/2015   Procedure: Coronary Stent Intervention;  Surgeon: Leonie Man, MD;  Location: [place] [facility];  Service: Cardiovascular;  Laterality:  Right;  . CAROTID ENDARTERECTOMY  05/04/2005   R stenosis >80% on 03/16/2005  . CATARACT EXTRACTION    . CORONARY ANGIOPLASTY    . GLAUCOMA SURGERY  06/25/2005    Current Medications: Outpatient Medications Prior to Visit  Medication Sig Dispense Refill  . atorvastatin (LIPITOR) 80 MG tablet TAKE ONE TABLET BY MOUTH DAILY AT 6 PM 90 tablet 3  . b complex vitamins tablet Take 1 tablet by mouth daily.    . Coenzyme Q10 (CO Q-10) 100 MG CAPS Take 1 capsule by mouth daily.     . [employer] CHILDRENS ASPIRIN 81 MG chewable tablet TAKE ONE TABLET BY MOUTH DAILY 90 tablet 3  . donepezil (ARICEPT) 10 MG tablet TAKE 1 TABLET (10 MG TOTAL) BY MOUTH AT BEDTIME. 30 tablet 3  . enalapril (VASOTEC) 2.5 MG tablet TAKE 1 TABLET BY MOUTH EVERY DAY 90 tablet 3  . Glucos-MSM-C-Mn-Ginger-Willow (MSM GLUCOSAMINE COMPLEX PO) Take 1 tablet by mouth daily. Reported on 08/03/2015    . Incontinence Supplies MISC Use as directed 1 each prn  . Incontinence Supply Disposable (REALITY INCONTINENT BRIEFS SM) MISC Use as needed. 1 each prn  . Multiple Vitamins-Minerals (MULTIVITAMIN WITH MINERALS) tablet Take 1 tablet by mouth every evening.     . Nutritional Supplements (GLUCERNA ADVANCE SHAKE) LIQD Take 1 Bottle by mouth daily. 12 Bottle 6  . rOPINIRole (REQUIP) 1 MG tablet Take 3 tablets (3 mg  total) by mouth at bedtime. 180 tablet 3  . sertraline (ZOLOFT) 50 MG tablet Take 1.5 tablets (75 mg total) by mouth daily. 45 tablet 2  . sitaGLIPtin (JANUVIA) 100 MG tablet Take 1 tablet (100 mg total) by mouth daily. 90 tablet 3  . traZODone (DESYREL) 50 MG tablet 25-50 mg at night as needed for insomnia 30 tablet 1  . Valproate Sodium (DEPAKENE) 250 MG/5ML SOLN solution Take 5 mLs (250 mg total) by mouth 2 (two) times daily. 473 mL 1  . BRILINTA 90 MG TABS tablet TAKE 1 TABLET BY MOUTH 2 TIMES DAILY. 60 tablet 11   No facility-administered medications prior to visit.      Allergies:   Codeine; Neosporin [neomycin-bacitracin  zn-polymyx]; Petroleum jelly [petrolatum]; Caine-1 [lidocaine hcl]; Clindamycin/lincomycin; Isothiazolinone chloride; Neomycin sulfate; Phenylene; and Quaternium-15   Social History   Social History  . Marital status: Married    Spouse name: N/A  . Number of children: N/A  . Years of education: N/A   Social History Main Topics  . Smoking status: Never Smoker  . Smokeless tobacco: Never Used  . Alcohol use No  . Drug use: No  . Sexual activity: Not Currently   Other Topics Concern  . None   Social History Narrative   Lives with: daughter and  5 grandchildren   Hobbies: ballroom dance   Religion: Mormon              Family History:  The patient's family history includes Aneurysm in her father; Cancer in her daughter, maternal grandfather, mother, paternal grandmother, and sister; Diabetes in her maternal grandmother.   ROS:   Please see the history of present illness.    Cough, diarrhea, easy bruising, headaches, wheezing, and leg pain.  All other systems reviewed and are negative.   PHYSICAL EXAM:   VS:  BP (!) 142/74 (BP Location: Left Arm)   Pulse 63   Ht 5' 3.5" (1.613 m)   Wt 130 lb 6.4 oz (59.1 kg)   BMI 22.74 kg/m    GEN: Well nourished, well developed, in no acute distress  HEENT: normal  Neck: no JVD, carotid bruits, or masses Cardiac: RRR; no murmurs, rubs, or edema. An S4 gallop is audible. Respiratory:  clear to auscultation bilaterally, normal work of breathing GI: soft, nontender, nondistended, + BS MS: no deformity or atrophy  Skin: warm and dry, no rash Neuro:  Alert and Oriented x 3, Strength and sensation are intact Psych: euthymic mood, full affect  Wt Readings from Last 3 Encounters:  10/26/16 130 lb 6.4 oz (59.1 kg)  05/01/16 137 lb (62.1 kg)  03/13/16 130 lb 7 oz (59.2 kg)      Studies/Labs Reviewed:   EKG:  EKG  Sinus rhythm, left atrial abnormality, otherwise unremarkable.  Recent Labs: No results found for requested labs within  last 8760 hours.   Lipid Panel    Component Value Date/Time   CHOL 136 06/04/2015 0339   TRIG 94 06/04/2015 0339   HDL 60 06/04/2015 0339   CHOLHDL 2.3 06/04/2015 0339   VLDL 19 06/04/2015 0339   LDLCALC 57 06/04/2015 0339   LDLDIRECT 72 06/17/2009 2044    Additional studies/ records that were reviewed today include:    Coronary angiography post DES March 2017:  Diagnostic Diagram       Post-Intervention Diagram        Echocardiography January 2017: Study Conclusions   - Left ventricle: The cavity size was normal. There was  mild   concentric hypertrophy. Systolic function was normal. The   estimated ejection fraction was in the range of 60% to 65%. Wall   motion was normal; there were no regional wall motion   abnormalities. Features are consistent with a pseudonormal left   ventricular filling pattern, with concomitant abnormal relaxation   and increased filling pressure (grade 2 diastolic dysfunction).   Doppler parameters are consistent with elevated ventricular   end-diastolic filling pressure. - Aortic valve: Trileaflet; normal thickness leaflets. There was   mild regurgitation. - Aortic root: The aortic root was normal in size. - Mitral valve: Structurally normal valve. There was mild   regurgitation. - Left atrium: The atrium was normal in size. - Right ventricle: The cavity size was normal. Wall thickness was   normal. Systolic function was normal. - Right atrium: The atrium was normal in size. - Tricuspid valve: There was mild regurgitation. - Pulmonic valve: There was mild regurgitation. - Pulmonary arteries: Systolic pressure was within the normal   range. PA peak pressure: 31 mm Hg (S). - Inferior vena cava: The vessel was normal in size. - Pericardium, extracardiac: There was no pericardial effusion   ASSESSMENT:    1. Coronary artery disease involving native coronary artery of native heart without angina pectoris   2. Chronic diastolic heart  failure ([place])   3. Essential hypertension   4. Type 2 diabetes mellitus with complication, without long-term current use of insulin ([place])   5. SOB (shortness of breath)      PLAN:  In order of problems listed above:  1. Patient is doing relatively well without angina. She has chronic total occlusion of the RCA and had successful stenting of the circumflex greater than 12 months ago.. We will discontinue Brilinta and start Plavix 75 mg per day, 2. She is short of breath when she lies down. BNP will be ordered. May need diuretic therapy.Marland Kitchen 2-D Doppler echocardiogram to assess LV function. Getting off Brilinta may also help with the dyspnea. 3. Borderline control. We'll continue to observe for the time being.  Clinical follow-up in 3 months. Perhaps diuretic therapy for orthopnea will be started based on lab work. 2-D Doppler echocardiogram to assess residual LV function. Brilinta is being transitioned to clopidogrel.  Medication Adjustments/Labs and Tests Ordered: Current medicines are reviewed at length with the patient today.  Concerns regarding medicines are outlined above.  Medication changes, Labs and Tests ordered today are listed in the Patient Instructions below. Patient Instructions  Medication Instructions:  1) STOP Brilinta 2) START Plavix 75mg  once daily 3) Make sure you are taking Aspirin 81mg  once daily  Labwork: Your physician recommends that you return for lab work at same time of your echocardiogram.  You do need to be fasting for these labs. (Lipid, CMET, BNP)   Testing/Procedures: Your physician has requested that you have an echocardiogram. Echocardiography is a painless test that uses sound waves to create images of your heart. It provides your doctor with information about the size and shape of your heart and how well your heart's chambers and valves are working. This procedure takes approximately one hour. There are no restrictions for this  procedure.   Follow-Up: Your physician recommends that you schedule a follow-up appointment in: 3 months with Dr. Tamala Julian.    Any Other Special Instructions Will Be Listed Below (If Applicable).     If you need a refill on your cardiac medications before your next appointment, please call your pharmacy.  Signed, Sinclair Grooms, MD  10/26/2016 12:29 PM    [address], [place], [place]  [address] Phone: [phone]; Fax: [phone]

## 2016-10-26 NOTE — Patient Instructions (Addendum)
Medication Instructions:  1) STOP Brilinta 2) START Plavix 75mg  once daily 3) Make sure you are taking Aspirin 81mg  once daily  Labwork: Your physician recommends that you return for lab work at same time of your echocardiogram.  You do need to be fasting for these labs. (Lipid, CMET, BNP)   Testing/Procedures: Your physician has requested that you have an echocardiogram. Echocardiography is a painless test that uses sound waves to create images of your heart. It provides your doctor with information about the size and shape of your heart and how well your heart's chambers and valves are working. This procedure takes approximately one hour. There are no restrictions for this procedure.   Follow-Up: Your physician recommends that you schedule a follow-up appointment in: 3 months with Dr. Tamala Julian.    Any Other Special Instructions Will Be Listed Below (If Applicable).     If you need a refill on your cardiac medications before your next appointment, please call your pharmacy.

## 2016-10-31 ENCOUNTER — Ambulatory Visit: Payer: Self-pay | Admitting: Neurology

## 2016-11-05 ENCOUNTER — Encounter: Payer: Self-pay | Admitting: Neurology

## 2016-11-06 IMAGING — CR DG CHEST 1V PORT
1 series · 1 of 1 positions shown · non-contrast
Comparison: Chest x-ray 07/08/2012.

CLINICAL DATA: 74-year-old female with history of chest tightness
today radiating from the right to the left side of the chest and the
arms with some burning. Shortness of breath.

EXAM:
PORTABLE CHEST 1 VIEW

[AP]
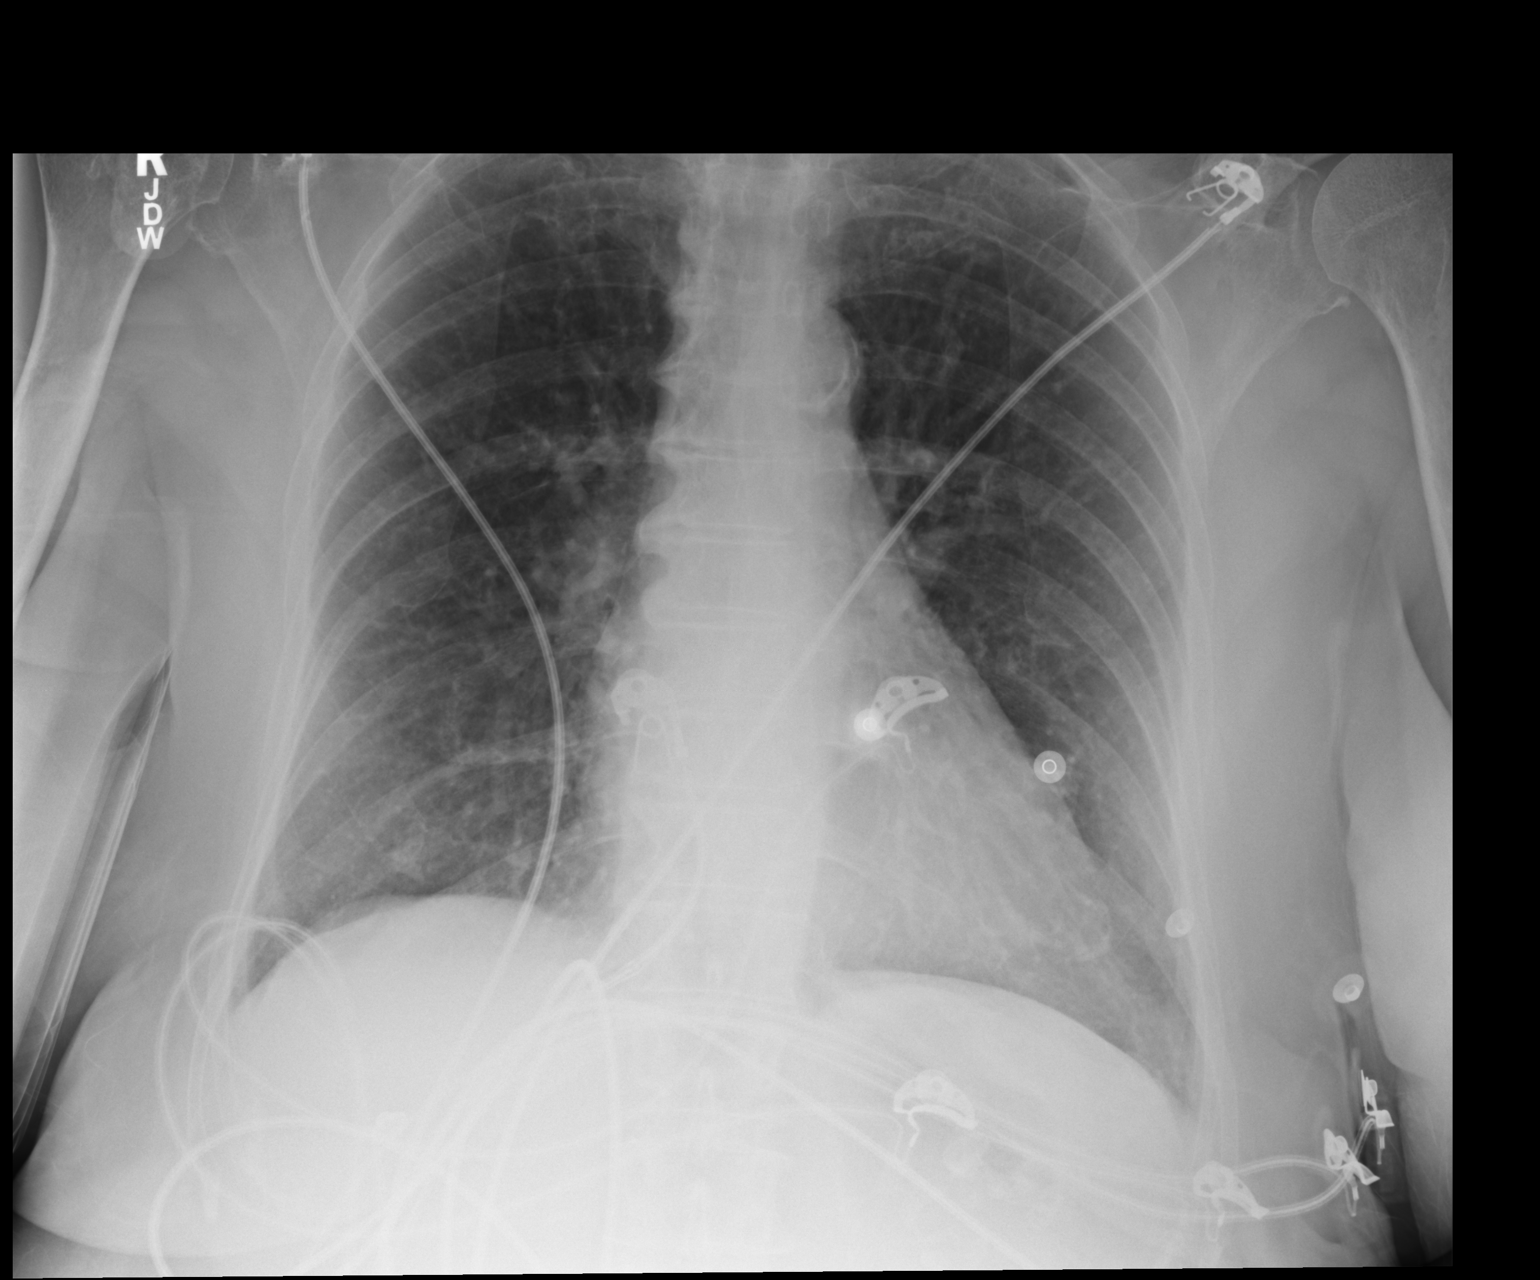

[1 of 1 positions shown; findings below may reference images not displayed]

FINDINGS: Lung volumes are normal. No consolidative airspace disease. No
pleural effusions. No pneumothorax. No pulmonary nodule or mass
noted. Extensive bilateral apical pleural parenchymal thickening and
calcifications, similar prior examinations, most compatible with
chronic post infectious or inflammatory scarring. Pulmonary
vasculature and the cardiomediastinal silhouette are within normal
limits. Atherosclerosis in the thoracic aorta.
IMPRESSION: 1.  No radiographic evidence of acute cardiopulmonary disease.
2. Atherosclerosis.

## 2016-11-08 ENCOUNTER — Ambulatory Visit (HOSPITAL_COMMUNITY): Payer: Self-pay | Admitting: Psychiatry

## 2016-11-14 ENCOUNTER — Ambulatory Visit (HOSPITAL_COMMUNITY): Payer: Medicare (Managed Care) | Admitting: Psychiatry

## 2016-11-15 ENCOUNTER — Telehealth: Payer: Self-pay | Admitting: Interventional Cardiology

## 2016-11-15 DIAGNOSIS — Z79899 Other long term (current) drug therapy: Secondary | ICD-10-CM

## 2016-11-15 NOTE — Telephone Encounter (Signed)
Contacted [facility] to see if Dr. Thompson Caul requested labs had been drawn (Lipids, CMET and BNP).  Left message to call back.

## 2016-11-21 ENCOUNTER — Ambulatory Visit (HOSPITAL_COMMUNITY): Payer: Medicare (Managed Care)

## 2016-11-21 ENCOUNTER — Other Ambulatory Visit: Payer: Medicare (Managed Care)

## 2016-11-22 NOTE — Telephone Encounter (Signed)
Spoke with Brittany Chung at [facility] and she states labs were just drawn and results are not back yet.  She verified fax number with me and states she will send over results as soon as they are available.

## 2016-11-23 NOTE — Telephone Encounter (Signed)
Lab results received, abnormal.  Reviewed by DOD, Dr. Saunders Revel.  Recommended holding enalapril and starting lasix 20mg  daily.  Repeat BMET in 1 week.     Spoke to patient-request to call [facility] to inform as they control her medications.  Echo next week 6/29, will repeat blood work at that time.    Called [facility]-need to fax orders to [phone]  Orders faxed to # provided.

## 2016-11-26 NOTE — Telephone Encounter (Deleted)
Spoke with pt and he states that he received a call from "some company" telling him he needed to get our office to order some lab work because he is on the Eliquis.  Advised pt he did need a BMET as it should be checked every 6 months.  Pt states he has an upcoming appt with his PCP and she does a full panel of blood work for his annual physical.  Advised pt to continue with plan for labs at PCP office.  Pt appreciative for call.  

## 2016-11-27 ENCOUNTER — Telehealth: Payer: Self-pay | Admitting: Interventional Cardiology

## 2016-11-27 NOTE — Progress Notes (Signed)
Cardiology Office Note    Date:  11/28/2016   ID:  Brittany Chung, DOB [birth year], MRN [id number]  PCP:  Brittany Pou, MD  Cardiologist: Sinclair Grooms, MD   Chief Complaint  Patient presents with  . Coronary Artery Disease    History of Present Illness:  Brittany Chung is a 76 y.o. female with history of vascular disease including carotid endarterectomy 2006, and acute coronary syndrome in March 2017 receiving a DES in the circumflex and left with moderate LAD disease and chronic total occlusion of the RCA supplied by collaterals.  She is doing okay. She denies dyspnea despite concern about this on the last office visit. Lasix was prescribed but never administered because of relatively low blood pressures. A proBNP was 1150 with an upper range of 740 done and blood work from [facility]. No chest discomfort. No palpitations. No syncope.  Past Medical History:  Diagnosis Date  . Cancer ([place])    skin  . Carotid stenosis    s/p R CEA 2006  . Cataract    s/p surgery 2007  . Depression   . Diabetes mellitus without complication ([place])   . Hypertension   . Irregular heart beat   . Memory loss   . Stroke [facility]) 2006    Past Surgical History:  Procedure Laterality Date  . BASAL CELL CARCINOMA EXCISION  02/18/2003  . CARDIAC CATHETERIZATION N/A 08/05/2015   Procedure: Left Heart Cath and Coronary Angiography;  Surgeon: Leonie Man, MD;  Location: [place] [facility];  Service: Cardiovascular;  Laterality: N/A;  . CARDIAC CATHETERIZATION Right 08/05/2015   Procedure: Coronary Stent Intervention;  Surgeon: Leonie Man, MD;  Location: [place] [facility];  Service: Cardiovascular;  Laterality: Right;  . CAROTID ENDARTERECTOMY  05/04/2005   R stenosis >80% on 03/16/2005  . CATARACT EXTRACTION    . CORONARY ANGIOPLASTY    . GLAUCOMA SURGERY  06/25/2005    Current Medications: Outpatient Medications Prior to Visit  Medication Sig Dispense Refill  . atorvastatin  (LIPITOR) 80 MG tablet TAKE ONE TABLET BY MOUTH DAILY AT 6 PM 90 tablet 3  . b complex vitamins tablet Take 1 tablet by mouth daily.    . clopidogrel (PLAVIX) 75 MG tablet Take 1 tablet (75 mg total) by mouth daily. 90 tablet 3  . Coenzyme Q10 (CO Q-10) 100 MG CAPS Take 1 capsule by mouth daily.     . [employer] CHILDRENS ASPIRIN 81 MG chewable tablet TAKE ONE TABLET BY MOUTH DAILY 90 tablet 3  . donepezil (ARICEPT) 10 MG tablet TAKE 1 TABLET (10 MG TOTAL) BY MOUTH AT BEDTIME. 30 tablet 3  . enalapril (VASOTEC) 2.5 MG tablet TAKE 1 TABLET BY MOUTH EVERY DAY 90 tablet 3  . Glucos-MSM-C-Mn-Ginger-Willow (MSM GLUCOSAMINE COMPLEX PO) Take 1 tablet by mouth daily. Reported on 08/03/2015    . Incontinence Supplies MISC Use as directed 1 each prn  . Incontinence Supply Disposable (REALITY INCONTINENT BRIEFS SM) MISC Use as needed. 1 each prn  . Multiple Vitamins-Minerals (MULTIVITAMIN WITH MINERALS) tablet Take 1 tablet by mouth every evening.     . Nutritional Supplements (GLUCERNA ADVANCE SHAKE) LIQD Take 1 Bottle by mouth daily. 12 Bottle 6  . rOPINIRole (REQUIP) 1 MG tablet Take 3 tablets (3 mg total) by mouth at bedtime. 180 tablet 3  . sertraline (ZOLOFT) 50 MG tablet Take 1.5 tablets (75 mg total) by mouth daily. 45 tablet 2  . sitaGLIPtin (JANUVIA) 100 MG tablet  Take 1 tablet (100 mg total) by mouth daily. 90 tablet 3  . traZODone (DESYREL) 50 MG tablet 25-50 mg at night as needed for insomnia 30 tablet 1  . Valproate Sodium (DEPAKENE) 250 MG/5ML SOLN solution Take 5 mLs (250 mg total) by mouth 2 (two) times daily. 473 mL 1   No facility-administered medications prior to visit.      Allergies:   Codeine; Neosporin [neomycin-bacitracin zn-polymyx]; Petroleum jelly [petrolatum]; Caine-1 [lidocaine hcl]; Clindamycin/lincomycin; Isothiazolinone chloride; Neomycin sulfate; Phenylene; and Quaternium-15   Social History   Social History  . Marital status: Married    Spouse name: N/A  . Number of  children: N/A  . Years of education: N/A   Social History Main Topics  . Smoking status: Never Smoker  . Smokeless tobacco: Never Used  . Alcohol use No  . Drug use: No  . Sexual activity: Not Currently   Other Topics Concern  . None   Social History Narrative   Lives with: daughter and  5 grandchildren   Hobbies: ballroom dance   Religion: Mormon              Family History:  The patient's family history includes Aneurysm in her father; Cancer in her daughter, maternal grandfather, mother, paternal grandmother, and sister; Diabetes in her maternal grandmother.   ROS:   Please see the history of present illness.    Able to dance without limitations. Denies orthopnea and PND deep.  All other systems reviewed and are negative.   PHYSICAL EXAM:   VS:  BP (!) 136/54 (BP Location: Left Arm)   Pulse 72   Ht 5' 3.5" (1.613 m)   Wt 131 lb 6.4 oz (59.6 kg)   BMI 22.91 kg/m    GEN: Well nourished, well developed, in no acute distress  HEENT: normal  Neck: no JVD, carotid bruits, or masses Cardiac: RRR; no murmurs, rubs, or gallops,no edema  Respiratory:  clear to auscultation bilaterally, normal work of breathing GI: soft, nontender, nondistended, + BS MS: no deformity or atrophy  Skin: warm and dry, no rash Neuro:  Alert and Oriented x 3, Strength and sensation are intact Psych: euthymic mood, full affect  Wt Readings from Last 3 Encounters:  11/28/16 131 lb 6.4 oz (59.6 kg)  10/26/16 130 lb 6.4 oz (59.1 kg)  05/01/16 137 lb (62.1 kg)      Studies/Labs Reviewed:   EKG:  EKG  none  Recent Labs: No results found for requested labs within last 8760 hours.   Lipid Panel    Component Value Date/Time   CHOL 136 06/04/2015 0339   TRIG 94 06/04/2015 0339   HDL 60 06/04/2015 0339   CHOLHDL 2.3 06/04/2015 0339   VLDL 19 06/04/2015 0339   LDLCALC 57 06/04/2015 0339   LDLDIRECT 72 06/17/2009 2044    Additional studies/ records that were reviewed today include:    BNP 1153    ASSESSMENT:    1. Chronic diastolic heart failure ([place])   2. Coronary artery disease involving native coronary artery of native heart without angina pectoris   3. Type 2 diabetes mellitus with complication, without long-term current use of insulin ([place])   4. Essential hypertension      PLAN:  In order of problems listed above:  1. No evidence of volume overload. Asymptomatic elevation in BNP. No change in therapy. We'll withhold diuretic therapy since she has had some difficulty recently with low blood pressure. Cautioned them to notify us should she  have shortness of breath.  2. No angina or symptoms to suggest ischemia. 3. Managed by her primary physician. 4. Excellent blood pressure recording today.  Continue current therapy. Clinical follow-up in 9 months. No difficulty since switching from Brilinta to Plavix. Call if dyspnea or other cardiac symptoms.    Medication Adjustments/Labs and Tests Ordered: Current medicines are reviewed at length with the patient today.  Concerns regarding medicines are outlined above.  Medication changes, Labs and Tests ordered today are listed in the Patient Instructions below. There are no Patient Instructions on file for this visit.   Signed, Sinclair Grooms, MD  11/28/2016 3:27 PM    [facility] [address], [place], [place]  [address] Phone: [phone]; Fax: [phone]

## 2016-11-27 NOTE — Telephone Encounter (Addendum)
Spoke with Dr. Jimmye Norman she states she is F/U with  the conversation on pt from yesterday. Pro-BNP 582 . SBP increased to 120 with fluids.  Dr Gwyndolyn Saxon will fax over a copy of labs to this office.

## 2016-11-27 NOTE — Telephone Encounter (Signed)
New Message    Dr Jimmye Norman said you would know what this means.  Pro BP =582  SBP increased to 120 with fluids.   FYI - she is faxing over a copy of labs for you

## 2016-11-28 ENCOUNTER — Encounter: Payer: Self-pay | Admitting: Interventional Cardiology

## 2016-11-28 ENCOUNTER — Ambulatory Visit (INDEPENDENT_AMBULATORY_CARE_PROVIDER_SITE_OTHER): Payer: Medicare (Managed Care) | Admitting: Interventional Cardiology

## 2016-11-28 VITALS — BP 136/54 | HR 72 | Ht 63.5 in | Wt 131.4 lb

## 2016-11-28 DIAGNOSIS — E118 Type 2 diabetes mellitus with unspecified complications: Secondary | ICD-10-CM | POA: Diagnosis not present

## 2016-11-28 DIAGNOSIS — I10 Essential (primary) hypertension: Secondary | ICD-10-CM

## 2016-11-28 DIAGNOSIS — I2510 Atherosclerotic heart disease of native coronary artery without angina pectoris: Secondary | ICD-10-CM

## 2016-11-28 DIAGNOSIS — I5032 Chronic diastolic (congestive) heart failure: Secondary | ICD-10-CM

## 2016-11-28 NOTE — Patient Instructions (Signed)
Medication Instructions:  None  Labwork: None  Testing/Procedures: None  Follow-Up: Your physician wants you to follow-up in: 6-9 months with Dr. Smith.  You will receive a reminder letter in the mail two months in advance. If you don't receive a letter, please call our office to schedule the follow-up appointment.   Any Other Special Instructions Will Be Listed Below (If Applicable).     If you need a refill on your cardiac medications before your next appointment, please call your pharmacy.   

## 2016-11-28 NOTE — Telephone Encounter (Signed)
Dealt with

## 2016-11-30 ENCOUNTER — Other Ambulatory Visit: Payer: Medicare (Managed Care)

## 2016-11-30 ENCOUNTER — Other Ambulatory Visit (HOSPITAL_COMMUNITY): Payer: Medicare (Managed Care)

## 2016-12-17 ENCOUNTER — Other Ambulatory Visit: Payer: Self-pay | Admitting: Internal Medicine

## 2016-12-17 DIAGNOSIS — Z1231 Encounter for screening mammogram for malignant neoplasm of breast: Secondary | ICD-10-CM

## 2016-12-18 ENCOUNTER — Encounter (HOSPITAL_COMMUNITY): Payer: Self-pay | Admitting: Psychiatry

## 2016-12-18 ENCOUNTER — Ambulatory Visit (INDEPENDENT_AMBULATORY_CARE_PROVIDER_SITE_OTHER): Payer: Medicare (Managed Care) | Admitting: Psychiatry

## 2016-12-18 DIAGNOSIS — F310 Bipolar disorder, current episode hypomanic: Secondary | ICD-10-CM | POA: Diagnosis not present

## 2016-12-18 DIAGNOSIS — F09 Unspecified mental disorder due to known physiological condition: Secondary | ICD-10-CM

## 2016-12-18 MED ORDER — VALPROATE SODIUM 250 MG/5ML PO SOLN: 250.0000 mg | Freq: Three times a day (TID) | ORAL | 1 refills | Status: DC

## 2016-12-18 MED ORDER — TRAZODONE HCL 50 MG PO TABS: ORAL_TABLET | ORAL | 1 refills | Status: DC

## 2016-12-18 MED ORDER — SERTRALINE HCL 50 MG PO TABS: 75.0000 mg | ORAL_TABLET | Freq: Every day | ORAL | 2 refills | Status: DC

## 2016-12-18 NOTE — Progress Notes (Addendum)
BH MD/PA/NP OP Progress Note  12/18/2016 2:40 PM Brittany Chung  MRN:  [id number]  Chief Complaint:  Subjective:  I liked the program but some nights I cannot sleep.  HPI: Brittany Chung came for her follow-up appointment with her daughter.  She is in the [facility] program and she really liked the program very well.  She goes 3-4 times a week.  Though she is feeling better about her daughter is still concerned about her manic symptoms.  In the night she is very restless, anxious and somewhat manic.  She is compliant with her medication.  She is taking Depakote 250 mg twice a day.  We have requested to have blood work done at the program but we never received the results.  Patient denies any crying spells, hallucination, paranoia but continues to have memory impairment and easily forgetful.  She has no issues with the medication.  Her attention and focus is poor.  She gets easily distracted and requires redirection.  Overall her daughter endorsed that medicine helping but wondering of Depakote can further increase.  Her appetite is okay.  Her vital signs are stable.  Patient denies drinking alcohol or using any illegal substances.  Visit Diagnosis:    ICD-10-CM   1. Bipolar affective disorder, current episode hypomanic (HCC) F31.0 Valproate Sodium (DEPAKENE) 250 MG/5ML SOLN solution    sertraline (ZOLOFT) 50 MG tablet    traZODone (DESYREL) 50 MG tablet    Past Psychiatric History: Reviewed. Patient has history of bipolar disorder for a long time but she feels embarrassed accepted. In the past she was given Seroquel by primary care physician but she stopped because of sedation. He also tried Vyvanse to help her ADD but it make her manic and is a stop due to her cardiac history. Patient denies any history of suicidal attempt or any inpatient psychiatric treatment  Past Medical History:  Past Medical History:  Diagnosis Date  . Cancer ([place])    skin  . Carotid stenosis    s/p R CEA 2006  . Cataract     s/p surgery 2007  . Depression   . Diabetes mellitus without complication ([place])   . Hypertension   . Irregular heart beat   . Memory loss   . Stroke [facility]) 2006    Past Surgical History:  Procedure Laterality Date  . BASAL CELL CARCINOMA EXCISION  02/18/2003  . CARDIAC CATHETERIZATION N/A 08/05/2015   Procedure: Left Heart Cath and Coronary Angiography;  Surgeon: Leonie Man, MD;  Location: [place] [facility];  Service: Cardiovascular;  Laterality: N/A;  . CARDIAC CATHETERIZATION Right 08/05/2015   Procedure: Coronary Stent Intervention;  Surgeon: Leonie Man, MD;  Location: [place] [facility];  Service: Cardiovascular;  Laterality: Right;  . CAROTID ENDARTERECTOMY  05/04/2005   R stenosis >80% on 03/16/2005  . CATARACT EXTRACTION    . CORONARY ANGIOPLASTY    . GLAUCOMA SURGERY  06/25/2005    Family Psychiatric History: Reviewed.  Family History:  Family History  Problem Relation Age of Onset  . Cancer Mother        Breast  . Aneurysm Father   . Cancer Sister        lung cancer  . Cancer Daughter        colon cancer  . Diabetes Maternal Grandmother   . Cancer Maternal Grandfather        lung  . Cancer Paternal Grandmother        mouth  Social History:  Social History   Social History  . Marital status: Married    Spouse name: N/A  . Number of children: N/A  . Years of education: N/A   Social History Main Topics  . Smoking status: Never Smoker  . Smokeless tobacco: Never Used  . Alcohol use No  . Drug use: No  . Sexual activity: Not Currently   Other Topics Concern  . Not on file   Social History Narrative   Lives with: daughter and  5 grandchildren   Hobbies: ballroom dance   Religion: [employer]             Allergies:  Allergies  Allergen Reactions  . Codeine     REACTION: nausea/vomiting  . Neosporin [Neomycin-Bacitracin Zn-Polymyx]   . Petroleum Jelly [Petrolatum]   . Caine-1 [Lidocaine Hcl] Rash  . Clindamycin/Lincomycin Swelling   . Isothiazolinone Chloride Rash  . Neomycin Sulfate Rash  . Phenylene Rash    Phenylenediamine  . [id number] Rash    Metabolic Disorder Labs: Lab Results  Component Value Date   HGBA1C 6.8 12/15/2015   MPG 148 06/04/2015   No results found for: PROLACTIN Lab Results  Component Value Date   CHOL 136 06/04/2015   TRIG 94 06/04/2015   HDL 60 06/04/2015   CHOLHDL 2.3 06/04/2015   VLDL 19 06/04/2015   LDLCALC 57 06/04/2015   LDLCALC 85 09/28/2014     Current Medications: Current Outpatient Prescriptions  Medication Sig Dispense Refill  . atorvastatin (LIPITOR) 80 MG tablet TAKE ONE TABLET BY MOUTH DAILY AT 6 PM 90 tablet 3  . b complex vitamins tablet Take 1 tablet by mouth daily.    . clopidogrel (PLAVIX) 75 MG tablet Take 1 tablet (75 mg total) by mouth daily. 90 tablet 3  . Coenzyme Q10 (CO Q-10) 100 MG CAPS Take 1 capsule by mouth daily.     . [employer] CHILDRENS ASPIRIN 81 MG chewable tablet TAKE ONE TABLET BY MOUTH DAILY 90 tablet 3  . donepezil (ARICEPT) 10 MG tablet TAKE 1 TABLET (10 MG TOTAL) BY MOUTH AT BEDTIME. 30 tablet 3  . enalapril (VASOTEC) 2.5 MG tablet TAKE 1 TABLET BY MOUTH EVERY DAY 90 tablet 3  . Glucos-MSM-C-Mn-Ginger-Willow (MSM GLUCOSAMINE COMPLEX PO) Take 1 tablet by mouth daily. Reported on 08/03/2015    . Incontinence Supplies MISC Use as directed 1 each prn  . Incontinence Supply Disposable (REALITY INCONTINENT BRIEFS SM) MISC Use as needed. 1 each prn  . Multiple Vitamins-Minerals (MULTIVITAMIN WITH MINERALS) tablet Take 1 tablet by mouth every evening.     . Nutritional Supplements (GLUCERNA ADVANCE SHAKE) LIQD Take 1 Bottle by mouth daily. 12 Bottle 6  . rOPINIRole (REQUIP) 1 MG tablet Take 3 tablets (3 mg total) by mouth at bedtime. 180 tablet 3  . sertraline (ZOLOFT) 50 MG tablet Take 1.5 tablets (75 mg total) by mouth daily. 45 tablet 2  . sitaGLIPtin (JANUVIA) 100 MG tablet Take 1 tablet (100 mg total) by mouth daily. 90 tablet 3  . traZODone  (DESYREL) 50 MG tablet 25-50 mg at night as needed for insomnia 30 tablet 1  . Valproate Sodium (DEPAKENE) 250 MG/5ML SOLN solution Take 5 mLs (250 mg total) by mouth 2 (two) times daily. 473 mL 1  . [DISCONTINUED] rOPINIRole (REQUIP) 1 MG tablet Take 1 tablet (1 mg total) by mouth at bedtime. 30 tablet 3   No current facility-administered medications for this visit.     Neurologic: Headache: No Seizure: No  Paresthesias: No  Musculoskeletal: Strength & Muscle Tone: within normal limits Gait & Station: normal Patient leans: N/A  Psychiatric Specialty Exam: ROS  Blood pressure (!) 117/53, pulse 90, height 5' 3.5" (1.613 m), weight 131 lb (59.4 kg).Body mass index is 22.84 kg/m.  General Appearance: Casual  Eye Contact:  Fair  Speech:  Fast but coherent  Volume:  Normal  Mood:  Anxious  Affect:  Congruent  Thought Process:  Goal Directed  Orientation:  Full (Time, Place, and Person)  Thought Content: Rumination   Suicidal Thoughts:  No  Homicidal Thoughts:  No  Memory:  Immediate;   Poor Recent;   Poor Remote;   Fair  Judgement:  Fair  Insight:  Fair  Psychomotor Activity:  Increased  Concentration:  Concentration: Fair and Attention Span: Fair  Recall:  [employer] of Knowledge: Fair  Language: Good  Akathisia:  No  Handed:  Right  AIMS (if indicated):  0  Assets:  Communication Skills Desire for Improvement Housing Resilience Social Support  ADL's:  Intact  Cognition: Impaired,  Moderate  Sleep:  Fair    Assessment: Bipolar disorder mixed.  Cognitive disorder NOS.  Plan: Patient continues to have residual mood lability.  Recommended to increase Depakote 250 mg 3 times a day.  Reminded to get blood work for Depakote level.  Patient's daughter agreed that she will get blood work at [facility].  I will continue Zoloft 75 mg daily and trazodone 50 mg at bedtime.  At this time patient does not have any side effects.  Recommended to call us back if she has any  question, concern or if she feels worsening of the symptom.  Follow-up in 3 months.  I spoke to Dr. Jimmye Norman at [facility] program.  Her Depakote level is 56.  I informed that we are going to increase the dose to 250 mg 3 times a day.  Nalayah Hitt T., MD 12/18/2016, 2:40 PM

## 2016-12-24 ENCOUNTER — Other Ambulatory Visit: Payer: Self-pay

## 2017-01-18 ENCOUNTER — Ambulatory Visit: Payer: Self-pay | Admitting: Neurology

## 2017-01-21 ENCOUNTER — Ambulatory Visit: Payer: Medicare (Managed Care) | Admitting: Interventional Cardiology

## 2017-01-22 ENCOUNTER — Ambulatory Visit: Payer: Medicare (Managed Care) | Admitting: Interventional Cardiology

## 2017-01-29 ENCOUNTER — Ambulatory Visit: Payer: Self-pay | Admitting: Neurology

## 2017-01-29 ENCOUNTER — Ambulatory Visit: Payer: Medicare (Managed Care)

## 2017-01-30 ENCOUNTER — Ambulatory Visit
Admission: RE | Admit: 2017-01-30 | Discharge: 2017-01-30 | Disposition: A | Discharge status: Home / Self Care | Payer: Medicare (Managed Care) | Source: Ambulatory Visit | Attending: Internal Medicine | Admitting: Internal Medicine

## 2017-01-30 DIAGNOSIS — Z1231 Encounter for screening mammogram for malignant neoplasm of breast: Secondary | ICD-10-CM

## 2017-02-19 ENCOUNTER — Ambulatory Visit (HOSPITAL_COMMUNITY): Payer: Self-pay | Admitting: Psychiatry

## 2017-04-17 ENCOUNTER — Encounter (HOSPITAL_COMMUNITY): Payer: Self-pay | Admitting: Psychiatry

## 2017-04-17 ENCOUNTER — Ambulatory Visit (INDEPENDENT_AMBULATORY_CARE_PROVIDER_SITE_OTHER): Payer: Medicare (Managed Care) | Admitting: Psychiatry

## 2017-04-17 DIAGNOSIS — F310 Bipolar disorder, current episode hypomanic: Secondary | ICD-10-CM | POA: Diagnosis not present

## 2017-04-17 DIAGNOSIS — F09 Unspecified mental disorder due to known physiological condition: Secondary | ICD-10-CM | POA: Diagnosis not present

## 2017-04-17 MED ORDER — VALPROATE SODIUM 250 MG/5ML PO SOLN: 250.0000 mg | Freq: Three times a day (TID) | ORAL | 1 refills | Status: AC

## 2017-04-17 MED ORDER — SERTRALINE HCL 50 MG PO TABS: 75.0000 mg | ORAL_TABLET | Freq: Every day | ORAL | 2 refills | Status: AC

## 2017-04-17 MED ORDER — TRAZODONE HCL 50 MG PO TABS: ORAL_TABLET | ORAL | 1 refills | Status: AC

## 2017-04-17 NOTE — Addendum Note (Signed)
Addended by: Dennie Maizes E on: 04/17/2017 03:09 PM   Modules accepted: Orders

## 2017-04-17 NOTE — Progress Notes (Signed)
[facility] MD/PA/NP OP Progress Note  04/17/2017 11:54 AM Brittany Chung  MRN:  [id number]  Chief Complaint: I am sleeping better.  I am taking medication.  HPI: Patient came for her follow-up appointment with her daughter.  She is going 3 days a week to the [facility] program.  Her daughter-in-law she continues to have stubbornness and manic symptoms.  She gets sometimes very demanding and restless but overall much improvement since Depakote was increased.  She is sleeping better.  Patient does not like Depakote because it is keeping her calm and slow down and she like to be hyper.  Her attention coloration remains distracted.  She still have memory impairment but she insists that she wants to drive.  Patient denies any side effects from the medication including tremors shakes or any EPS.  She denies any paranoia or any hallucination.  She gets easily distracted and requires redirections to answer the question.  We are still waiting Depakote level.  Her daughter reported that she had a Depakote level at pace but we have not received the results.  Her appetite is okay.  Her vital signs are stable.  Patient denies any aggressive behavior or any paranoia, hallucination or any suicidal thoughts.  Visit Diagnosis:    ICD-10-CM   1. Bipolar affective disorder, current episode hypomanic (HCC) F31.0 Valproate Sodium (DEPAKENE) 250 MG/5ML SOLN solution    traZODone (DESYREL) 50 MG tablet    sertraline (ZOLOFT) 50 MG tablet    Past Psychiatric History: Reviewed. Patient has history of bipolar disorder for a long time but she feels embarrassed accepted. In the past she was given Seroquel by primary care physician but she stopped because of sedation. He also tried Vyvanse to help her ADD but it make her manic and is a stop due to her cardiac history. Patient denies any history of suicidal attempt or any inpatient psychiatric treatment  Past Medical History:  Past Medical History:  Diagnosis Date  . Cancer ([place])     skin  . Carotid stenosis    s/p R CEA 2006  . Cataract    s/p surgery 2007  . Depression   . Diabetes mellitus without complication ([place])   . Hypertension   . Irregular heart beat   . Memory loss   . Stroke [facility]) 2006    Past Surgical History:  Procedure Laterality Date  . BASAL CELL CARCINOMA EXCISION  02/18/2003  . CAROTID ENDARTERECTOMY  05/04/2005   R stenosis >80% on 03/16/2005  . CATARACT EXTRACTION    . CORONARY ANGIOPLASTY    . GLAUCOMA SURGERY  06/25/2005    Family Psychiatric History: Reviewed.  Family History:  Family History  Problem Relation Age of Onset  . Cancer Mother        Breast  . Breast cancer Mother   . Aneurysm Father   . Cancer Sister        lung cancer  . Breast cancer Sister   . Cancer Daughter        colon cancer  . Diabetes Maternal Grandmother   . Cancer Maternal Grandfather        lung  . Cancer Paternal Grandmother        mouth   . Breast cancer Maternal Aunt   . Breast cancer Paternal Aunt   . Breast cancer Paternal Aunt     Social History:  Social History   Socioeconomic History  . Marital status: Married    Spouse name: Not on file  .  Number of children: Not on file  . Years of education: Not on file  . Highest education level: Not on file  Social Needs  . Financial resource strain: Not on file  . Food insecurity - worry: Not on file  . Food insecurity - inability: Not on file  . Transportation needs - medical: Not on file  . Transportation needs - non-medical: Not on file  Occupational History  . Not on file  Tobacco Use  . Smoking status: Never Smoker  . Smokeless tobacco: Never Used  Substance and Sexual Activity  . Alcohol use: No    Alcohol/week: 0.0 oz  . Drug use: No  . Sexual activity: Not Currently  Other Topics Concern  . Not on file  Social History Narrative   Lives with: daughter and  5 grandchildren   Hobbies: ballroom dance   Religion: [employer]             Allergies:  Allergies   Allergen Reactions  . Codeine     REACTION: nausea/vomiting  . Neosporin [Neomycin-Bacitracin Zn-Polymyx]   . Petroleum Jelly [Petrolatum]   . Caine-1 [Lidocaine Hcl] Rash  . Clindamycin/Lincomycin Swelling  . Isothiazolinone Chloride Rash  . Neomycin Sulfate Rash  . Phenylene Rash    Phenylenediamine  . [id number] Rash    Metabolic Disorder Labs: Lab Results  Component Value Date   HGBA1C 6.8 12/15/2015   MPG 148 06/04/2015   No results found for: PROLACTIN Lab Results  Component Value Date   CHOL 136 06/04/2015   TRIG 94 06/04/2015   HDL 60 06/04/2015   CHOLHDL 2.3 06/04/2015   VLDL 19 06/04/2015   LDLCALC 57 06/04/2015   LDLCALC 85 09/28/2014   Lab Results  Component Value Date   TSH 0.963 01/18/2012   TSH 1.531 06/17/2009    Therapeutic Level Labs: No results found for: LITHIUM No results found for: VALPROATE No components found for:  CBMZ  Current Medications: Current Outpatient Medications  Medication Sig Dispense Refill  . atorvastatin (LIPITOR) 80 MG tablet TAKE ONE TABLET BY MOUTH DAILY AT 6 PM 90 tablet 3  . b complex vitamins tablet Take 1 tablet by mouth daily.    . clopidogrel (PLAVIX) 75 MG tablet Take 1 tablet (75 mg total) by mouth daily. 90 tablet 3  . Coenzyme Q10 (CO Q-10) 100 MG CAPS Take 1 capsule by mouth daily.     . [employer] CHILDRENS ASPIRIN 81 MG chewable tablet TAKE ONE TABLET BY MOUTH DAILY 90 tablet 3  . donepezil (ARICEPT) 10 MG tablet TAKE 1 TABLET (10 MG TOTAL) BY MOUTH AT BEDTIME. 30 tablet 3  . enalapril (VASOTEC) 2.5 MG tablet TAKE 1 TABLET BY MOUTH EVERY DAY 90 tablet 3  . Glucos-MSM-C-Mn-Ginger-Willow (MSM GLUCOSAMINE COMPLEX PO) Take 1 tablet by mouth daily. Reported on 08/03/2015    . Incontinence Supplies MISC Use as directed 1 each prn  . Incontinence Supply Disposable (REALITY INCONTINENT BRIEFS SM) MISC Use as needed. 1 each prn  . Multiple Vitamins-Minerals (MULTIVITAMIN WITH MINERALS) tablet Take 1 tablet by mouth every  evening.     . Nutritional Supplements (GLUCERNA ADVANCE SHAKE) LIQD Take 1 Bottle by mouth daily. 12 Bottle 6  . rOPINIRole (REQUIP) 1 MG tablet Take 3 tablets (3 mg total) by mouth at bedtime. 180 tablet 3  . sertraline (ZOLOFT) 50 MG tablet Take 1.5 tablets (75 mg total) by mouth daily. 45 tablet 2  . sitaGLIPtin (JANUVIA) 100 MG tablet Take 1 tablet (100 mg  total) by mouth daily. 90 tablet 3  . traZODone (DESYREL) 50 MG tablet 25-50 mg at night as needed for insomnia 30 tablet 1  . Valproate Sodium (DEPAKENE) 250 MG/5ML SOLN solution Take 5 mLs (250 mg total) by mouth 3 (three) times daily. 473 mL 1  . [DISCONTINUED] rOPINIRole (REQUIP) 1 MG tablet Take 1 tablet (1 mg total) by mouth at bedtime. 30 tablet 3   No current facility-administered medications for this visit.      Musculoskeletal: Strength & Muscle Tone: within normal limits Gait & Station: normal Patient leans: N/A  Psychiatric Specialty Exam: ROS  There were no vitals taken for this visit.There is no height or weight on file to calculate BMI.  General Appearance: Well Groomed  Eye Contact:  Fair  Speech:  fast but coherent  Volume:  Normal  Mood:  Frustrated  Affect:  Labile  Thought Process:  Descriptions of Associations: Circumstantial  Orientation:  Full (Time, Place, and Person)  Thought Content: Rumination   Suicidal Thoughts:  No  Homicidal Thoughts:  No  Memory:  Immediate;   Fair Recent;   Fair Remote;   Fair  Judgement:  Fair  Insight:  Fair  Psychomotor Activity:  Increased  Concentration:  Concentration: Fair and Attention Span: Fair  Recall:  [employer] of Knowledge: Fair  Language: Good  Akathisia:  No  Handed:  Right  AIMS (if indicated): not done  Assets:  Communication Skills Desire for Improvement  ADL's:  Intact  Cognition: Impaired,  Moderate  Sleep:  Good   Screenings: PHQ2-9     Office Visit from 01/19/2016 in [place] Office Visit from 01/02/2016 in  [place] Office Visit from 11/08/2015 in [place] Office Visit from 08/09/2015 in [place] Office Visit from 06/10/2015 in [place]  PHQ-2 Total Score  0  0  1  0  4       Assessment and Plan: Bipolar disorder, mixed.  Cognitive disorder NOS.  Patient doing better since Depakote dose increased.  She is taking to 50 mg 3 times a day.  Her daughter also endorsed improvement in her mood but continues to have cognitive impairment.  Encouraged to keep 3 days a week based program.  Reinforced blood work results including Depakote level.  Continue trazodone 50 mg at bedtime and Zoloft 75 mg daily.  Patient does not have any side effects.  Recommended to call us back if she has any question, concern if she feels worsening of the symptoms.  Follow-up in 3 months.   Marlena Barbato T., MD 04/17/2017, 11:54 AM

## 2017-06-17 ENCOUNTER — Ambulatory Visit (HOSPITAL_COMMUNITY)
Admission: RE | Admit: 2017-06-17 | Discharge: 2017-06-17 | Disposition: A | Discharge status: Home / Self Care | Payer: Medicare (Managed Care) | Source: Ambulatory Visit | Attending: Internal Medicine | Admitting: Internal Medicine

## 2017-06-17 ENCOUNTER — Other Ambulatory Visit (HOSPITAL_COMMUNITY): Payer: Self-pay | Admitting: Internal Medicine

## 2017-06-17 DIAGNOSIS — D72820 Lymphocytosis (symptomatic): Secondary | ICD-10-CM

## 2017-06-18 ENCOUNTER — Other Ambulatory Visit: Payer: Self-pay | Admitting: Internal Medicine

## 2017-06-18 ENCOUNTER — Ambulatory Visit
Admission: RE | Admit: 2017-06-18 | Discharge: 2017-06-18 | Disposition: A | Discharge status: Home / Self Care | Payer: Medicare (Managed Care) | Source: Ambulatory Visit | Attending: Internal Medicine | Admitting: Internal Medicine

## 2017-06-18 DIAGNOSIS — R109 Unspecified abdominal pain: Secondary | ICD-10-CM

## 2017-06-18 MED ORDER — IOPAMIDOL (ISOVUE-300) INJECTION 61%: 70.0000 mL | Freq: Once | INTRAVENOUS | Status: DC | PRN

## 2017-06-30 IMAGING — MR MR HEAD W/O CM
11 series · 48 of 48 positions shown · non-contrast
Comparison: 06/04/2015

CLINICAL DATA: Dementia. Cognitive decline for several months. Gait
problems. History of fall.

EXAM:
MRI HEAD WITHOUT CONTRAST
TECHNIQUE: Multiplanar, multiecho pulse sequences of the brain and surrounding
structures were obtained without intravenous contrast.

[Series 3: DWI · axial · 3.0mm · 1.80mm/px · z∈[-77,+70]mm · 7 of 100 slices shown (1 of 4)]
[im 1/100]
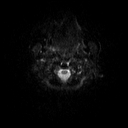
[im 17/100]
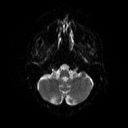
[im 34/100]
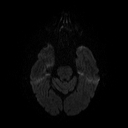
[im 50/100]
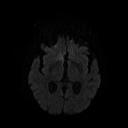
[im 67/100]
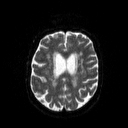
[im 83/100]
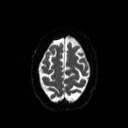
[im 100/100]
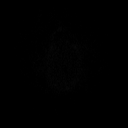

[Series 4: DWI · axial · 3.0mm · 1.80mm/px · z∈[-77,+70]mm · 4 of 49 slices shown (2 of 4)]
[im 1/49]
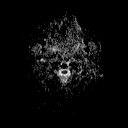
[im 17/49]
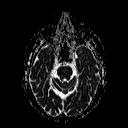
[im 33/49]
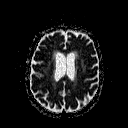
[im 49/49]
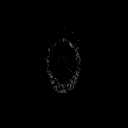

[Series 5: mip_images(sw) · axial · 16.0mm · 0.90mm/px · z∈[-75,+68]mm · 6 of 73 slices shown]
[im 1/73]
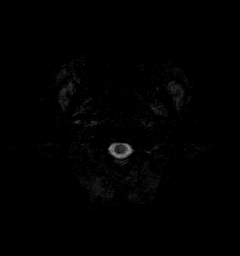
[im 15/73]
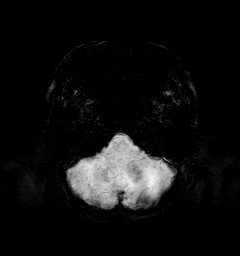
[im 29/73]
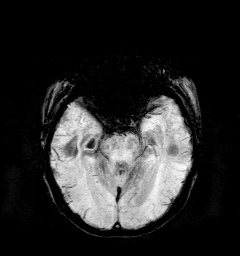
[im 44/73]
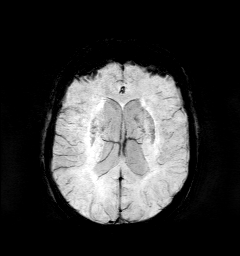
[im 58/73]
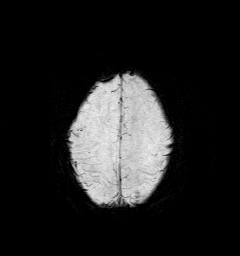
[im 73/73]
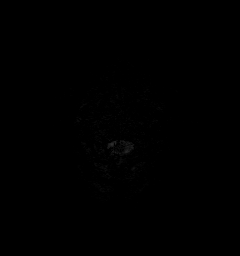

[Series 6: swi_images · axial · 2.0mm · 0.90mm/px · z∈[-82,+75]mm · 7 of 80 slices shown]
[im 1/80]
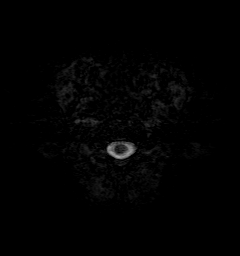
[im 14/80]
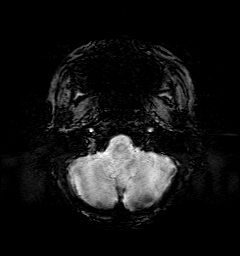
[im 27/80]
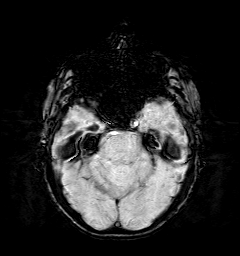
[im 40/80]
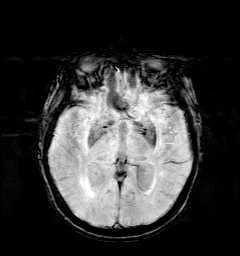
[im 53/80]
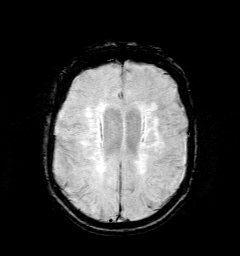
[im 66/80]
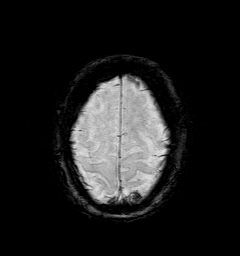
[im 80/80]
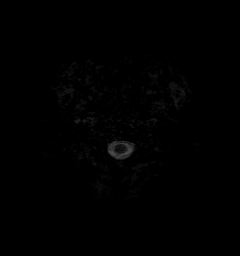

[Series 7: DWI · coronal · 5.0mm · 1.80mm/px · 6 of 66 slices shown (3 of 4)]
[im 1/66]
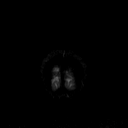
[im 14/66]
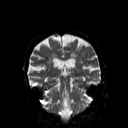
[im 27/66]
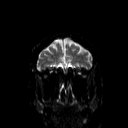
[im 40/66]
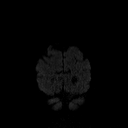
[im 53/66]
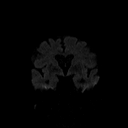
[im 66/66]
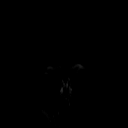

[Series 8: DWI · coronal · 5.0mm · 1.80mm/px · 3 of 34 slices shown (4 of 4)]
[im 1/34]
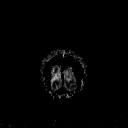
[im 17/34]
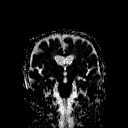
[im 34/34]
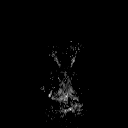

[Series 9: T2 · axial · 5.0mm · 0.51mm/px · z∈[-69,+72]mm · 2 of 22 slices shown (1 of 2)]
[im 1/22]
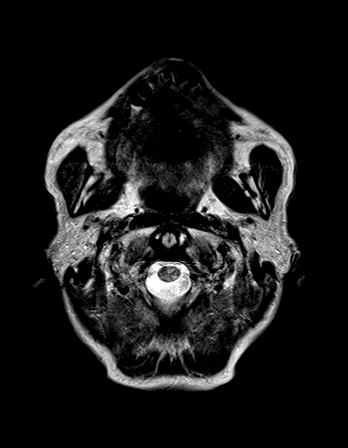
[im 22/22]
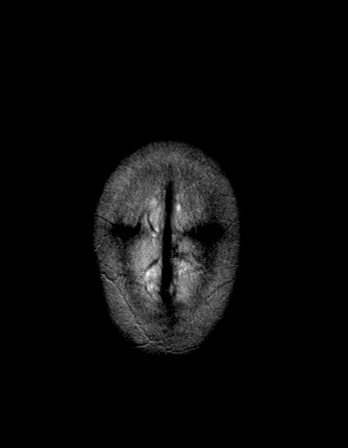

[Series 10: FLAIR · axial · 5.0mm · 0.45mm/px · z∈[-70,+71]mm · 2 of 22 slices shown]
[im 1/22]
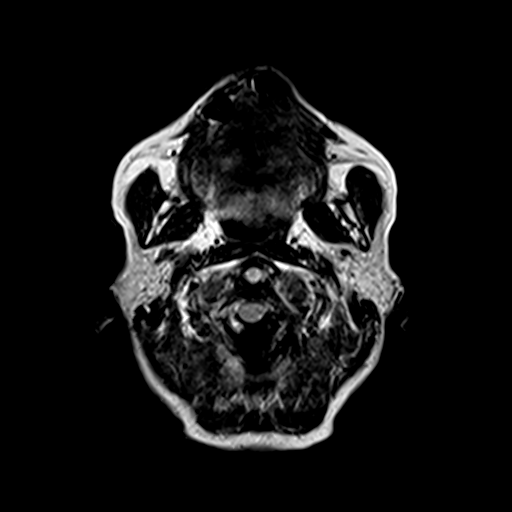
[im 22/22]
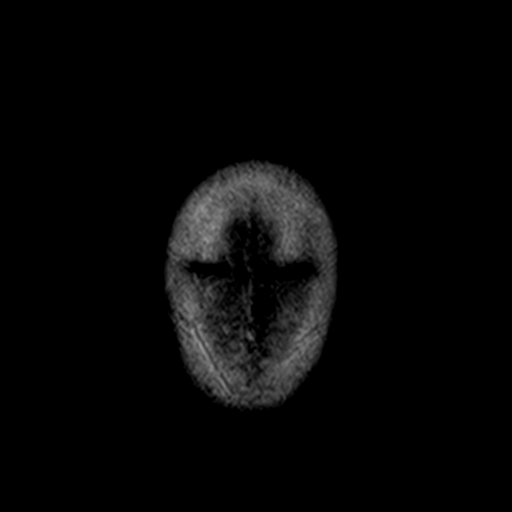

[Series 11: t1_mpr_tra · axial · 2.0mm · 0.45mm/px · z∈[-77,+80]mm · 7 of 80 slices shown]
[im 1/80]
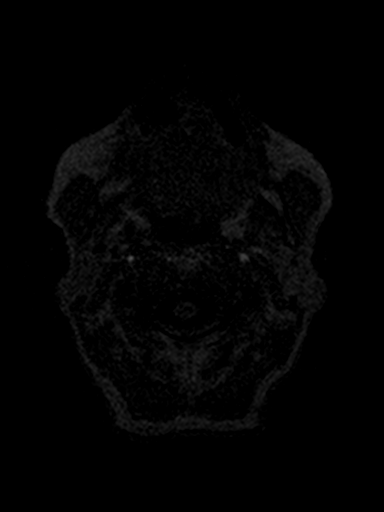
[im 14/80]
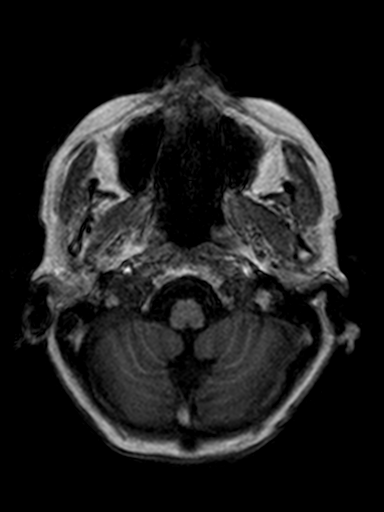
[im 27/80]
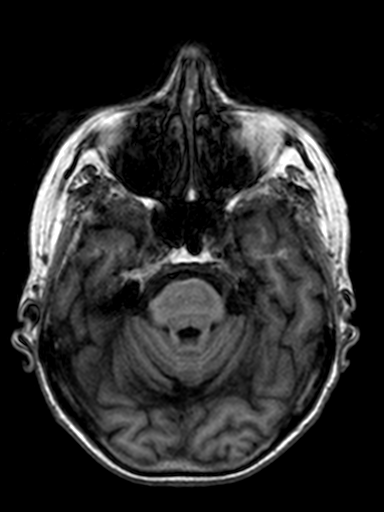
[im 40/80]
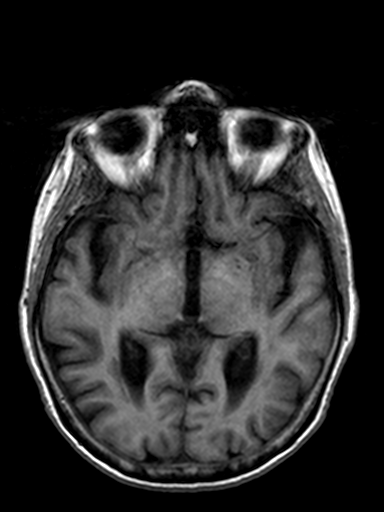
[im 53/80]
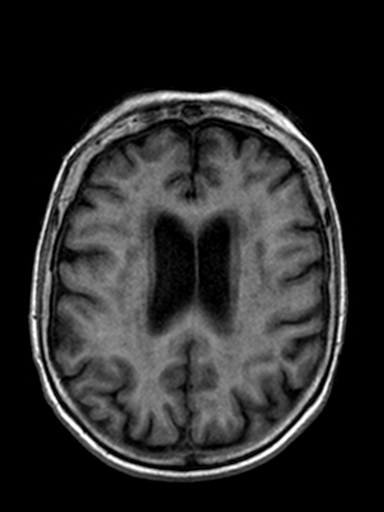
[im 66/80]
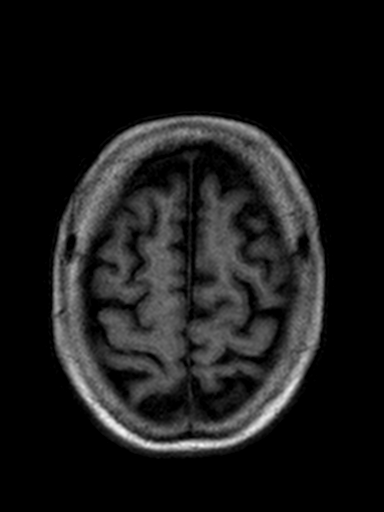
[im 80/80]
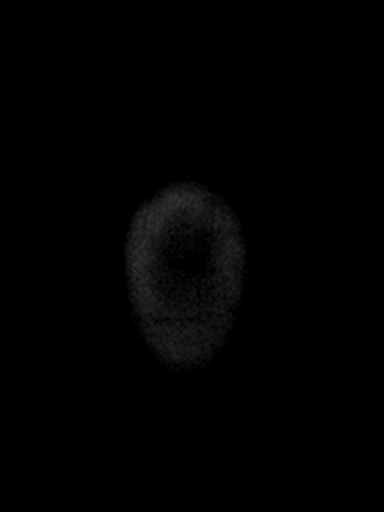

[Series 12: T2 · coronal · 5.0mm · 0.45mm/px · 2 of 25 slices shown (2 of 2)]
[im 1/25]
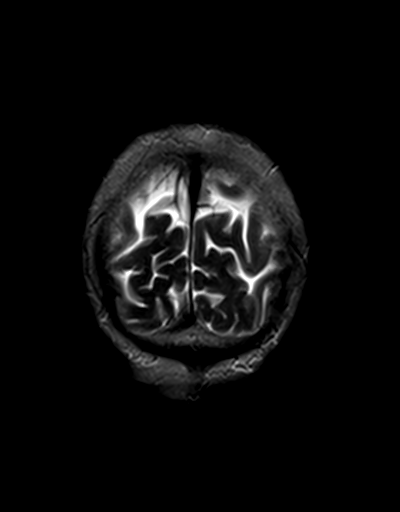
[im 25/25]
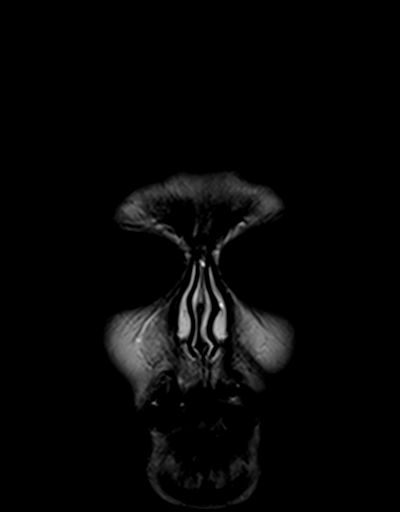

[Series 13: T1 · sagittal · 5.0mm · 0.45mm/px · 2 of 21 slices shown]
[im 1/21]
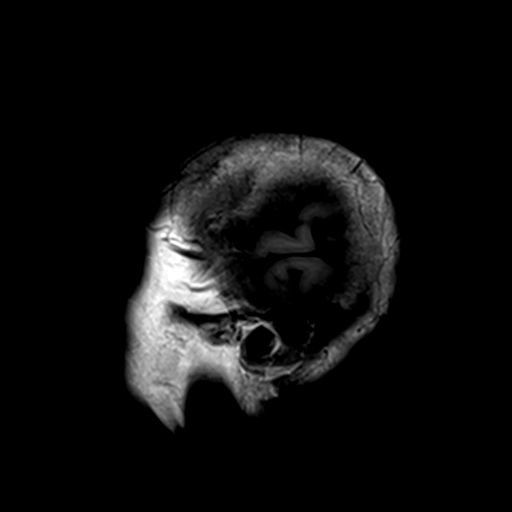
[im 21/21]
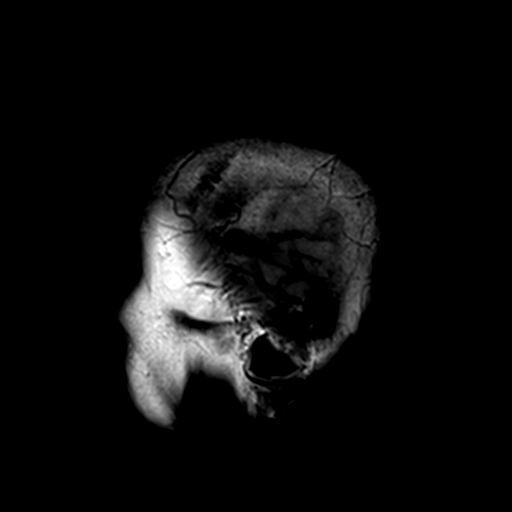

[48 of 48 positions shown; findings below may reference images not displayed]

FINDINGS: Brain: There is no evidence of acute infarct, intracranial
hemorrhage, mass, midline shift, or extra-axial fluid collection.
Mild cerebral and cerebellar atrophy is unchanged. Patchy to
confluent cerebral white matter T2 hyperintensities are unchanged
and nonspecific but compatible with moderate chronic small vessel
ischemic disease. Chronic right basal ganglia and left thalamic
lacunar infarcts are unchanged. Mild-to-moderate chronic small
vessel ischemic disease in the pons is similar to the prior study.

Vascular: Major intracranial vascular flow voids are preserved.

Skull and upper cervical spine: Unremarkable bone marrow signal.

Sinuses/Orbits: Prior bilateral cataract extraction. Mild left
sphenoid sinus mucosal thickening.

Other: None.
IMPRESSION: 1. No acute intracranial abnormality or mass.
2. Moderate chronic small vessel ischemic disease, unchanged.

## 2017-07-17 ENCOUNTER — Ambulatory Visit (HOSPITAL_COMMUNITY): Payer: Self-pay | Admitting: Psychiatry

## 2017-08-02 DEATH — deceased
